# Patient Record
Sex: Male | Born: 1935 | Race: Black or African American | Hispanic: No | Smoking: Former smoker
Health system: Southern US, Community
[De-identification: ages and names within clinical notes are randomized; demographics above are authoritative.]

## PROBLEM LIST (undated history)

## (undated) DIAGNOSIS — I10 Essential (primary) hypertension: Secondary | ICD-10-CM

## (undated) DIAGNOSIS — R1310 Dysphagia, unspecified: Secondary | ICD-10-CM

## (undated) DIAGNOSIS — Q740 Other congenital malformations of upper limb(s), including shoulder girdle: Secondary | ICD-10-CM

## (undated) DIAGNOSIS — F0390 Unspecified dementia without behavioral disturbance: Secondary | ICD-10-CM

## (undated) DIAGNOSIS — C801 Malignant (primary) neoplasm, unspecified: Secondary | ICD-10-CM

## (undated) DIAGNOSIS — R489 Unspecified symbolic dysfunctions: Secondary | ICD-10-CM

## (undated) DIAGNOSIS — M6282 Rhabdomyolysis: Secondary | ICD-10-CM

## (undated) DIAGNOSIS — W19XXXA Unspecified fall, initial encounter: Secondary | ICD-10-CM

## (undated) DIAGNOSIS — N189 Chronic kidney disease, unspecified: Secondary | ICD-10-CM

---

## 1999-11-21 ENCOUNTER — Ambulatory Visit (HOSPITAL_COMMUNITY): Admission: RE | Admit: 1999-11-21 | Discharge: 1999-11-21 | Payer: Self-pay | Admitting: Family Medicine

## 1999-11-21 ENCOUNTER — Encounter: Payer: Self-pay | Admitting: Family Medicine

## 2000-01-10 ENCOUNTER — Other Ambulatory Visit: Admission: RE | Admit: 2000-01-10 | Discharge: 2000-01-10 | Payer: Self-pay | Admitting: Urology

## 2000-01-10 ENCOUNTER — Encounter (INDEPENDENT_AMBULATORY_CARE_PROVIDER_SITE_OTHER): Payer: Self-pay | Admitting: Specialist

## 2000-02-25 ENCOUNTER — Encounter: Admission: RE | Admit: 2000-02-25 | Discharge: 2000-05-25 | Payer: Self-pay | Admitting: Radiation Oncology

## 2000-06-12 ENCOUNTER — Encounter: Admission: RE | Admit: 2000-06-12 | Discharge: 2000-09-10 | Payer: Self-pay | Admitting: Radiation Oncology

## 2000-10-16 ENCOUNTER — Encounter: Admission: RE | Admit: 2000-10-16 | Discharge: 2001-01-14 | Payer: Self-pay | Admitting: Radiation Oncology

## 2001-04-26 ENCOUNTER — Encounter: Payer: Self-pay | Admitting: Emergency Medicine

## 2001-04-26 ENCOUNTER — Emergency Department (HOSPITAL_COMMUNITY): Admission: EM | Admit: 2001-04-26 | Discharge: 2001-04-26 | Payer: Self-pay

## 2003-10-24 ENCOUNTER — Encounter: Admission: RE | Admit: 2003-10-24 | Discharge: 2003-10-24 | Payer: Self-pay | Admitting: Family Medicine

## 2003-11-05 ENCOUNTER — Emergency Department (HOSPITAL_COMMUNITY): Admission: EM | Admit: 2003-11-05 | Discharge: 2003-11-05 | Payer: Self-pay | Admitting: Emergency Medicine

## 2003-11-23 ENCOUNTER — Encounter: Admission: RE | Admit: 2003-11-23 | Discharge: 2003-11-23 | Payer: Self-pay | Admitting: Family Medicine

## 2003-12-02 ENCOUNTER — Encounter: Admission: RE | Admit: 2003-12-02 | Discharge: 2003-12-02 | Payer: Self-pay | Admitting: Family Medicine

## 2004-01-03 ENCOUNTER — Encounter: Admission: RE | Admit: 2004-01-03 | Discharge: 2004-01-03 | Payer: Self-pay | Admitting: Sports Medicine

## 2004-05-02 ENCOUNTER — Ambulatory Visit: Payer: Self-pay | Admitting: Family Medicine

## 2004-08-13 ENCOUNTER — Ambulatory Visit: Payer: Self-pay | Admitting: Family Medicine

## 2004-11-28 ENCOUNTER — Ambulatory Visit: Payer: Self-pay | Admitting: Family Medicine

## 2004-12-12 ENCOUNTER — Ambulatory Visit: Payer: Self-pay | Admitting: Family Medicine

## 2006-08-11 ENCOUNTER — Ambulatory Visit: Payer: Self-pay | Admitting: Family Medicine

## 2006-08-11 ENCOUNTER — Encounter: Payer: Self-pay | Admitting: Family Medicine

## 2006-08-11 LAB — CONVERTED CEMR LAB
ALT: 13 units/L (ref 0–53)
AST: 19 units/L (ref 0–37)
Albumin: 4.7 g/dL (ref 3.5–5.2)
Alkaline Phosphatase: 68 units/L (ref 39–117)
BUN: 22 mg/dL (ref 6–23)
CO2: 29 meq/L (ref 19–32)
Calcium: 9.8 mg/dL (ref 8.4–10.5)
Chloride: 100 meq/L (ref 96–112)
Cholesterol: 202 mg/dL — ABNORMAL HIGH (ref 0–200)
Creatinine, Ser: 1.3 mg/dL (ref 0.40–1.50)
Glucose, Bld: 87 mg/dL (ref 70–99)
HDL: 49 mg/dL (ref >39)
LDL Cholesterol: 140 mg/dL — ABNORMAL HIGH (ref 0–99)
Potassium: 5.2 meq/L (ref 3.5–5.3)
Sodium: 137 meq/L (ref 135–145)
Total Bilirubin: 0.6 mg/dL (ref 0.3–1.2)
Total CHOL/HDL Ratio: 4.1
Total Protein: 7.6 g/dL (ref 6.0–8.3)
Triglycerides: 67 mg/dL (ref <150)
VLDL: 13 mg/dL (ref 0–40)

## 2006-08-22 ENCOUNTER — Ambulatory Visit (HOSPITAL_COMMUNITY): Admission: RE | Admit: 2006-08-22 | Discharge: 2006-08-22 | Payer: Self-pay | Admitting: Gastroenterology

## 2006-09-25 DIAGNOSIS — N182 Chronic kidney disease, stage 2 (mild): Secondary | ICD-10-CM | POA: Insufficient documentation

## 2006-09-25 DIAGNOSIS — IMO0002 Reserved for concepts with insufficient information to code with codable children: Secondary | ICD-10-CM | POA: Insufficient documentation

## 2006-09-25 DIAGNOSIS — I10 Essential (primary) hypertension: Secondary | ICD-10-CM

## 2006-09-25 DIAGNOSIS — E7800 Pure hypercholesterolemia, unspecified: Secondary | ICD-10-CM | POA: Insufficient documentation

## 2006-09-25 DIAGNOSIS — C61 Malignant neoplasm of prostate: Secondary | ICD-10-CM

## 2006-09-25 DIAGNOSIS — M1710 Unilateral primary osteoarthritis, unspecified knee: Secondary | ICD-10-CM

## 2006-10-30 ENCOUNTER — Telehealth (INDEPENDENT_AMBULATORY_CARE_PROVIDER_SITE_OTHER): Payer: Self-pay | Admitting: *Deleted

## 2006-12-15 ENCOUNTER — Telehealth (INDEPENDENT_AMBULATORY_CARE_PROVIDER_SITE_OTHER): Payer: Self-pay | Admitting: *Deleted

## 2006-12-17 ENCOUNTER — Ambulatory Visit: Payer: Self-pay | Admitting: Family Medicine

## 2007-09-25 ENCOUNTER — Encounter (INDEPENDENT_AMBULATORY_CARE_PROVIDER_SITE_OTHER): Payer: Self-pay | Admitting: *Deleted

## 2007-12-01 ENCOUNTER — Ambulatory Visit: Payer: Self-pay | Admitting: Family Medicine

## 2007-12-17 ENCOUNTER — Encounter: Admission: RE | Admit: 2007-12-17 | Discharge: 2007-12-17 | Payer: Self-pay | Admitting: Family Medicine

## 2008-01-01 ENCOUNTER — Telehealth: Payer: Self-pay | Admitting: *Deleted

## 2008-02-18 ENCOUNTER — Telehealth: Payer: Self-pay | Admitting: *Deleted

## 2008-02-24 ENCOUNTER — Ambulatory Visit: Payer: Self-pay | Admitting: Family Medicine

## 2008-03-24 ENCOUNTER — Ambulatory Visit: Payer: Self-pay | Admitting: Family Medicine

## 2008-04-22 ENCOUNTER — Ambulatory Visit: Payer: Self-pay | Admitting: Family Medicine

## 2008-05-20 ENCOUNTER — Telehealth: Payer: Self-pay | Admitting: *Deleted

## 2008-10-14 ENCOUNTER — Ambulatory Visit (HOSPITAL_COMMUNITY): Admission: RE | Admit: 2008-10-14 | Discharge: 2008-10-14 | Payer: Self-pay | Admitting: Family Medicine

## 2008-10-14 ENCOUNTER — Ambulatory Visit: Payer: Self-pay | Admitting: Family Medicine

## 2008-10-14 ENCOUNTER — Encounter (INDEPENDENT_AMBULATORY_CARE_PROVIDER_SITE_OTHER): Payer: Self-pay | Admitting: Family Medicine

## 2008-10-14 LAB — CONVERTED CEMR LAB
Chloride: 103 meq/L (ref 96–112)
Cholesterol: 160 mg/dL (ref 0–200)
Creatinine, Ser: 1.14 mg/dL (ref 0.40–1.50)
HDL: 63 mg/dL (ref >39)
LDL Cholesterol: 87 mg/dL (ref 0–99)
Potassium: 4.4 meq/L (ref 3.5–5.3)
Triglycerides: 49 mg/dL (ref <150)

## 2008-10-19 ENCOUNTER — Encounter (INDEPENDENT_AMBULATORY_CARE_PROVIDER_SITE_OTHER): Payer: Self-pay | Admitting: Family Medicine

## 2008-11-15 ENCOUNTER — Ambulatory Visit: Payer: Self-pay | Admitting: Family Medicine

## 2008-12-20 ENCOUNTER — Ambulatory Visit: Payer: Self-pay | Admitting: Family Medicine

## 2009-01-03 ENCOUNTER — Encounter (INDEPENDENT_AMBULATORY_CARE_PROVIDER_SITE_OTHER): Payer: Self-pay | Admitting: Family Medicine

## 2009-03-06 ENCOUNTER — Ambulatory Visit: Payer: Self-pay | Admitting: Family Medicine

## 2009-03-13 ENCOUNTER — Telehealth: Payer: Self-pay | Admitting: Family Medicine

## 2009-04-18 ENCOUNTER — Ambulatory Visit: Payer: Self-pay | Admitting: Family Medicine

## 2009-05-25 ENCOUNTER — Ambulatory Visit: Payer: Self-pay | Admitting: Family Medicine

## 2009-07-05 ENCOUNTER — Ambulatory Visit: Payer: Self-pay | Admitting: Family Medicine

## 2009-08-28 ENCOUNTER — Ambulatory Visit: Payer: Self-pay | Admitting: Family Medicine

## 2009-09-26 DEATH — deceased

## 2009-09-30 ENCOUNTER — Ambulatory Visit: Payer: Self-pay | Admitting: Family Medicine

## 2009-09-30 ENCOUNTER — Inpatient Hospital Stay (HOSPITAL_COMMUNITY): Admission: EM | Admit: 2009-09-30 | Discharge: 2009-10-02 | Payer: Self-pay | Admitting: Emergency Medicine

## 2009-10-11 ENCOUNTER — Ambulatory Visit: Payer: Self-pay | Admitting: Family Medicine

## 2009-10-11 ENCOUNTER — Encounter: Payer: Self-pay | Admitting: Sports Medicine

## 2009-10-11 DIAGNOSIS — F0390 Unspecified dementia without behavioral disturbance: Secondary | ICD-10-CM | POA: Insufficient documentation

## 2009-10-11 LAB — CONVERTED CEMR LAB
ALT: 47 units/L (ref 0–53)
AST: 31 units/L (ref 0–37)
Albumin: 4 g/dL (ref 3.5–5.2)
Alkaline Phosphatase: 80 units/L (ref 39–117)
BUN: 28 mg/dL — ABNORMAL HIGH (ref 6–23)
CO2: 24 meq/L (ref 19–32)
Calcium: 9.5 mg/dL (ref 8.4–10.5)
Chloride: 97 meq/L (ref 96–112)
Creatinine, Ser: 1.43 mg/dL (ref 0.40–1.50)
Glucose, Bld: 106 mg/dL — ABNORMAL HIGH (ref 70–99)
HCT: 42.1 % (ref 39.0–52.0)
Hemoglobin: 13.8 g/dL (ref 13.0–17.0)
MCHC: 32.8 g/dL (ref 30.0–36.0)
MCV: 103.2 fL — ABNORMAL HIGH (ref 78.0–100.0)
Platelets: 301 10*3/uL (ref 150–400)
Potassium: 4.8 meq/L (ref 3.5–5.3)
Prealbumin: 23.4 mg/dL (ref 18.0–45.0)
RBC: 4.08 M/uL — ABNORMAL LOW (ref 4.22–5.81)
RDW: 12.8 % (ref 11.5–15.5)
Sodium: 134 meq/L — ABNORMAL LOW (ref 135–145)
Total Bilirubin: 0.4 mg/dL (ref 0.3–1.2)
Total Protein: 6.7 g/dL (ref 6.0–8.3)
WBC: 9.2 10*3/microliter (ref 4.0–10.5)

## 2009-10-26 ENCOUNTER — Encounter: Payer: Self-pay | Admitting: *Deleted

## 2009-11-27 ENCOUNTER — Ambulatory Visit: Payer: Self-pay | Admitting: Family Medicine

## 2009-12-07 ENCOUNTER — Ambulatory Visit: Payer: Self-pay | Admitting: Family Medicine

## 2009-12-07 ENCOUNTER — Encounter: Payer: Self-pay | Admitting: *Deleted

## 2009-12-21 ENCOUNTER — Encounter: Payer: Self-pay | Admitting: Family Medicine

## 2010-01-04 ENCOUNTER — Ambulatory Visit: Payer: Self-pay | Admitting: Family Medicine

## 2010-01-09 ENCOUNTER — Ambulatory Visit: Payer: Self-pay | Admitting: Family Medicine

## 2010-01-12 ENCOUNTER — Encounter: Payer: Self-pay | Admitting: Family Medicine

## 2010-01-12 ENCOUNTER — Ambulatory Visit: Payer: Self-pay | Admitting: Family Medicine

## 2010-01-12 ENCOUNTER — Encounter: Payer: Self-pay | Admitting: Sports Medicine

## 2010-01-18 ENCOUNTER — Encounter (INDEPENDENT_AMBULATORY_CARE_PROVIDER_SITE_OTHER): Payer: Self-pay | Admitting: *Deleted

## 2010-01-18 LAB — CONVERTED CEMR LAB
BUN: 14 mg/dL (ref 6–23)
CO2: 22 meq/L (ref 19–32)
Chloride: 103 meq/L (ref 96–112)
Creatinine, Ser: 1.03 mg/dL (ref 0.40–1.50)
Glucose, Bld: 97 mg/dL (ref 70–99)
Potassium: 3.6 meq/L (ref 3.5–5.3)

## 2010-01-23 ENCOUNTER — Encounter: Payer: Self-pay | Admitting: *Deleted

## 2010-04-10 ENCOUNTER — Ambulatory Visit: Payer: Self-pay | Admitting: Family Medicine

## 2010-06-04 ENCOUNTER — Encounter: Payer: Self-pay | Admitting: Sports Medicine

## 2010-07-18 ENCOUNTER — Ambulatory Visit: Payer: Self-pay | Admitting: Family Medicine

## 2010-07-18 ENCOUNTER — Encounter: Payer: Self-pay | Admitting: Sports Medicine

## 2010-08-03 ENCOUNTER — Ambulatory Visit: Admission: RE | Admit: 2010-08-03 | Discharge: 2010-08-03 | Payer: Self-pay | Source: Home / Self Care

## 2010-08-30 NOTE — Miscellaneous (Signed)
Summary: ophthalmology appt/TS  Clinical Lists Changes APPT WITH DR. [facility]  JUNE 2ND AT [facility]  [address]  [facility] # [phone]

## 2010-08-30 NOTE — Assessment & Plan Note (Signed)
Summary: F/U EO   Vital Signs:  Patient profile:   77 year old male Weight:      157.9 pounds Temp:     97.5 degrees F oral Pulse rate:   98 / minute Pulse rhythm:   regular BP sitting:   188 / 82  (left arm) Cuff size:   large  Vitals Entered By: Loralee Pacas CMA (August 28, 2009 3:42 PM)  Serial Vital Signs/Assessments:  Time      Position  BP       Pulse  Resp  Temp     By                     136/81                         Loralee Pacas CMA   Primary Care Provider:  Magnus Ivan MD  CC:  f/u knee pain.  History of Present Illness: knee pain:  patient still not bought tramadol.  still having difficulty explaining to him the need to buy medicine.  patient does not have pain moving around the house.  Allergies: No Known Drug Allergies  Physical Exam  General:  NAD vital signs noted and wnl and retake was 136/81. Msk:  5/5 lower body strength Extremities:  no lower extremity edema   Knee Exam  Inspection:    R knee swelling under the knee cap. L knee wnl  Palpation:    no effusions   Impression & Recommendations:  Problem # 1:  KNEE PAIN, RIGHT (ICD-719.46) Assessment Deteriorated  Pt still not with tramadol.  Ms. Kennon Rounds helped me to help pt organize what to take to pharmacist (med list with tramadol and insurance card).   His updated medication list for this problem includes:    Adult Aspirin Ec Low Strength 81 Mg Tbec (Aspirin) .Marland Kitchen... Daily    Tramadol Hcl 50 Mg Tabs (Tramadol hcl) .Marland Kitchen... Take 2 tablets three times a day  Orders: [facility]- Est Level  3 (16109)  Complete Medication List: 1)  Enalapril Maleate 20 Mg Tabs (Enalapril maleate) .... Take 2 tablet by mouth once a day 2)  Hydrochlorothiazide 25 Mg Tabs (Hydrochlorothiazide) .... Take one tab daily 3)  Zocor 10 Mg Tabs (Simvastatin) .... Take 1 tablet by mouth once a day 4)  Amlodipine Besylate 10 Mg Tabs (Amlodipine besylate) .Marland Kitchen.. 1 tab by mouth daily 5)  Adult Aspirin Ec Low Strength 81 Mg  Tbec (Aspirin) .... Daily 6)  Tramadol Hcl 50 Mg Tabs (Tramadol hcl) .... Take 2 tablets three times a day  Patient Instructions: 1)  Mr. Lozinski, I am giving you  sheet to take to your pharmacist.  Please take the sheet and your insurance card to your pharmacist so that you can start taking TRAMADOL for your knee pain.  Please follow up in a month to see if your pain is getting better. 2)  Thank you and be blessed! Prescriptions: TRAMADOL HCL 50 MG TABS (TRAMADOL HCL) take 2 tablets three times a day  #180 x 3   Entered and Authorized by:   Magnus Ivan MD   Signed by:   Magnus Ivan MD on 08/28/2009   Method used:   Electronically to        [facility] (retail)       [address]       [place], [place]  [address]       Ph:  [phone]       Fax: [phone]   RxID:   [id number]

## 2010-08-30 NOTE — Assessment & Plan Note (Signed)
Summary: Geri Evaluation   Vital Signs:  Patient profile:   77 year old male Weight:      144 pounds Pulse rate:   108 / minute BP sitting:   118 / 70  (left arm) Cuff size:   regular  Vitals Entered By: Renato Battles slade,cma CC: R knee pain Is Patient Diabetic? No Pain Assessment Patient in pain? yes        Primary Care Provider:  Magnus Ivan MD  CC:  R knee pain.  History of Present Illness: 77yo M here for geri assessment  Geri assessment: Last geri assessment was 04/2009 w/ a MMSE of 23/30.  Repeat MMSE per Dr. Karie Schwalbe conveyed score of 19/30 w/ concerns regarding his living situation.  Pt states that his major concern and complaint is right knee pain.  He states that he has chronic arthritis and thinks that is causing his pain.  Worsened over the past few weeks.  No recent injury, fever, worsening redness, or swelling.  Currently on tramadol/acetaminophen with relief occasionally.    He states that he lives in an apt alone and is able to cook, clean, and doing his shopping/finances.  Denies any recent falls.  Daughter is in the area and will check on him from time to time.    Has not picked up the Aricept prescribed on 5/2.      Habits & Providers  Alcohol-Tobacco-Diet     Tobacco Status: quit > 6 months  Current Medications (verified): 1)  Enalapril Maleate 20 Mg Tabs (Enalapril Maleate) .... Take 2 Tablet By Mouth Once A Day 2)  Hydrochlorothiazide 25 Mg  Tabs (Hydrochlorothiazide) .... Take One Tab Daily 3)  Zocor 10 Mg Tabs (Simvastatin) .... Take 1 Tablet By Mouth Once A Day 4)  Adult Aspirin Ec Low Strength 81 Mg Tbec (Aspirin) .... Daily 5)  Tramadol Hcl 50 Mg Tabs (Tramadol Hcl) .Marland Kitchen.. 1 Tab By Mouth Every 6 Hours As Needed For Breakthrough Pain 6)  Ensure/fiber  Liqd (Nutritional Supplements) .... One Shake Three Times A Day Between Meals 7)  Cyanocobalamin 1000 Mcg/ml Soln (Cyanocobalamin) .... Inject Im Q Monthly For 6 Months, First Dose Beginning of April  2011 8)  Aricept 5 Mg Tabs (Donepezil Hcl) .... One Tab By Mouth Qhs 9)  Tylenol Extra Strength 500 Mg Tabs (Acetaminophen) .... 2 Tabs By Mouth Every 8 Hours Scheduled For Arthritis  Allergies (verified): No Known Drug Allergies  Past History:  Past Medical History: Last updated: 11/27/2009 KNEE PAIN, RIGHT (ICD-719.46) RENAL INSUFFICIENCY, CHRONIC (ICD-586) PROSTATE CANCER (ICD-185) OSTEOARTHRITIS, MULTI SITES (ICD-715.98) HYPERTENSION, BENIGN SYSTEMIC (ICD-401.1) HYPERCHOLESTEROLEMIA (ICD-272.0) h/o stroke (03/06/2009)  Concern for literacy/educational status from 1997 records (updated 04/24/2009) MMSE 19 (See last geri assessment) Megaloblastic/B12 deficiency      Social History: Last updated: 11/27/2009 lives in apt in [place]. Divorced, works on occasion in [employer].  No tobacco, occas etoh (1 drink every other day to everyday).  Three sisters, had son die at 65 yo w/ ht problems, dtr lives in [place]. pt has not worked since 2007 (03/06/2009) Daughter :Sharon Seller, pt does not know contact info In the process of trying to get PCA for pt.  Social History: Smoking Status:  quit > 6 months  Physical Exam  General:  VS Reviewed. Elderly thin male, walks with a four prong walker favoring right leg  Head:  atraumatic Eyes:  cataract present EOMI PERRLA vision grossly intact Ears:  cerumen impacted in both ears Mouth:  edentulous Neck:  supple,  full ROM Lungs:  Normal respiratory effort, chest expands symmetrically. Lungs are clear to auscultation, no crackles or wheezes. Heart:  Normal rate and regular rhythm. S1 and S2 normal without gallop, murmur, click, rub or other extra sounds. Abdomen:  Soft, NT, ND, no HSM, active BS  Msk:  R knee exam: Inspection- no obvious bony deformity; no erythema, edema, or ecchymosis ROM- full extension and flexion Palpation- no joint line tenderness; no hypermobile patella; mild crepitus  ligaments intact with good  endpoints atalgic gait present favoring right leg Extremities:  no edema Neurologic:  A&O to person, place, and time (able to recall today's date) 3 word recall- nl Able to make association of his medications to his medical condition   Impression & Recommendations:  Problem # 1:  OSTEOARTHRITIS, MULTI SITES (ICD-715.98) Assessment Deteriorated R knee pain likely due to progressive OA.  Previous xray on 12/17/07 conveyed advanced DJD of the knee joint. It seems to be affecting his function more so than before.    Plan to refer to [facility] for evaluation for possible knee replacement. Will change pain medication to scheduled tylenol 1000mg  by mouth q8.  Will change tramadol to q6 as needed for breakthrough pain. Will order home PT.  His updated medication list for this problem includes:    Adult Aspirin Ec Low Strength 81 Mg Tbec (Aspirin) .Marland Kitchen... Daily    Tramadol Hcl 50 Mg Tabs (Tramadol hcl) .Marland Kitchen... 1 tab by mouth every 6 hours as needed for breakthrough pain    Tylenol Extra Strength 500 Mg Tabs (Acetaminophen) .Marland Kitchen... 2 tabs by mouth every 8 hours scheduled for arthritis  Orders: Home Health Referral [facility]) Orthopedic Referral (Ortho) [facility]- Est  Level 4 (16109)  Problem # 2:  DEMENTIA (ICD-294.8) Assessment: Unchanged  I suspect that his dementia is not as severe as the previous MMSE indicates.  He was able to answer questions appropriately and able to recall information.  Plan to continue with the Aricept prescribed.  Will further reassess the MMSE more thoroughly next month at [facility].  Orders: [facility]- Est  Level 4 ([address]454)  Problem # 3:  CERUMEN IMPACTION, BILATERAL (ICD-380.4) Assessment: Deteriorated  Nursing staff to perform irrigation.  Orders: [facility]- Est  Level 4 (09811)  Problem # 4:  MALNUTRITION (ICD-263.9) Assessment: Deteriorated  documented wt loss over the past few months. will f/u with pcp to observe and follow last albumin 4  (09/2009)  Orders: [facility]- Est  Level 4 (91478)  Problem # 5:  Preventive Health Care (ICD-V70.0) Assessment: Comment Only Pt w/ some degree of health illiteracy...will refer social work to his home to help him manage his finances and obtain resources to manage his health and medicines. Nursing staff to help him obtain eye appt. No signs of geriatric depression.  Complete Medication List: 1)  Enalapril Maleate 20 Mg Tabs (Enalapril maleate) .... Take 2 tablet by mouth once a day 2)  Hydrochlorothiazide 25 Mg Tabs (Hydrochlorothiazide) .... Take one tab daily 3)  Zocor 10 Mg Tabs (Simvastatin) .... Take 1 tablet by mouth once a day 4)  Adult Aspirin Ec Low Strength 81 Mg Tbec (Aspirin) .... Daily 5)  Tramadol Hcl 50 Mg Tabs (Tramadol hcl) .Marland Kitchen.. 1 tab by mouth every 6 hours as needed for breakthrough pain 6)  Ensure/fiber Liqd (Nutritional supplements) .... One shake three times a day between meals 7)  Cyanocobalamin 1000 Mcg/ml Soln (Cyanocobalamin) .... Inject im q monthly for 6 months, first dose beginning of april 2011 8)  Aricept 5 Mg Tabs (Donepezil hcl) .... One tab by mouth qhs 9)  Tylenol Extra Strength 500 Mg Tabs (Acetaminophen) .... 2 tabs by mouth every 8 hours scheduled for arthritis  Patient Instructions: 1)  Follow up in geri clinic in 1 month 2)  We are referring you to an orthopedist for further evaluation of your right knee.   3)  We are going to send a physical therapist and social worker to your home to help you with your walking and finances. 4)  We added Tylenol to your pain regimen. 5)  We changed your Tramadol to 1 tab by mouth every 6 hours as needed for breakthrough pain. 6)  Go pick up your Aricept.   Geriatric Assessment:  Geriatric Depression Score: (value/max value)    Short-15: 13/15

## 2010-08-30 NOTE — Assessment & Plan Note (Signed)
Summary: feet and lower leg swelling  Nurse Visit patient comes in to read PPD . son is with him today. feet and lower legs tight and swollen. son reports he did a lot of walking yesterday.  appointment scheduled for work in now to be evaluated .  Theresia Lo RN  January 12, 2010 10:23 AM  Noted, SDA today. Rodney Langton MD  January 15, 2010 3:14 PM    Allergies: No Known Drug Allergies  PPD Results    Date of reading: 01/12/2010    Results: 0 mm    Interpretation: negative

## 2010-08-30 NOTE — Assessment & Plan Note (Signed)
Summary: f/u  kh   Vital Signs:  Patient profile:   77 year old male Height:      72 inches Weight:      152.5 pounds BMI:     20.76 Temp:     98.3 degrees F oral Pulse rate:   82 / minute BP sitting:   167 / 88  (right arm) Cuff size:   regular  Vitals Entered By: Jimmy Footman, CMA (August 03, 2010 10:49 AM) CC: follow up knee injection Is Patient Diabetic? No   Primary Care Provider:  Rodney Langton MD  CC:  follow up knee injection.  History of Present Illness: 77 yo male with HTN and knee pain.  R knee pain:  Injected 07/18/10, pain significantly better, function much better.    HTN:  BP usually runs 120's-140's SBP.  He is high today.  Current Medications (verified): 1)  Enalapril Maleate 20 Mg Tabs (Enalapril Maleate) .... Take 2 Tablet By Mouth Once A Day 2)  Hydrochlorothiazide 25 Mg  Tabs (Hydrochlorothiazide) .... Take One Tab Daily 3)  Zocor 10 Mg Tabs (Simvastatin) .... Take 1 Tablet By Mouth Once A Day 4)  Adult Aspirin Ec Low Strength 81 Mg Tbec (Aspirin) .... Daily 5)  Tramadol Hcl 50 Mg Tabs (Tramadol Hcl) .Marland Kitchen.. 1 Tab By Mouth Every 6 Hours As Needed For Breakthrough Pain 6)  Ensure/fiber  Liqd (Nutritional Supplements) .... One Shake Three Times A Day Between Meals 7)  Cyanocobalamin 1000 Mcg/ml Soln (Cyanocobalamin) .... Inject Im Q Monthly For 6 Months, First Dose Beginning of April 2011 8)  Aricept 5 Mg Tabs (Donepezil Hcl) .... One Tab By Mouth Qhs 9)  Tylenol Extra Strength 500 Mg Tabs (Acetaminophen) .... 2 Tabs By Mouth Every 8 Hours Scheduled For Arthritis 10)  Compression Stockings .... Wear As Much As Tolerated Disp: Medium Grade, Knee High, 1 Pair  Allergies (verified): No Known Drug Allergies  Review of Systems       See HPI  Physical Exam  General:  Well-developed,well-nourished,in no acute distress; alert,appropriate and cooperative throughout examination Msk:  R Knee: Normal to inspection with no erythema or effusion or obvious  bony abnormalities. Palpation normal with no warmth or joint line tenderness or patellar tenderness or condyle tenderness. ROM normal in flexion and extension and lower leg rotation. Ligaments with solid consistent endpoints including ACL, PCL, LCL, MCL. Negative Mcmurray's and provocative meniscal tests. Non painful patellar compression. Patellar and quadriceps tendons unremarkable. Hamstring and quadriceps strength is normal.     Impression & Recommendations:  Problem # 1:  OSTEOARTHRITIS, MULTI SITES (ICD-715.98) Assessment Improved Knee symptoms much improved. I can inject his knee q87months as long as he still has long term benefits. RTC as needed.  His updated medication list for this problem includes:    Adult Aspirin Ec Low Strength 81 Mg Tbec (Aspirin) .Marland Kitchen... Daily    Tramadol Hcl 50 Mg Tabs (Tramadol hcl) .Marland Kitchen... 1 tab by mouth every 6 hours as needed for breakthrough pain    Tylenol Extra Strength 500 Mg Tabs (Acetaminophen) .Marland Kitchen... 2 tabs by mouth every 8 hours scheduled for arthritis  Orders: [facility]- Est Level  3 (16109)  Problem # 2:  HYPERTENSION, BENIGN SYSTEMIC (ICD-401.1) Assessment: Deteriorated Elevated today. Pt to RTC 3 months to recheck. Do not want tight control in this unsteady 77 year old as to avoid orthostasis and a fall.  His updated medication list for this problem includes:    Enalapril Maleate 20 Mg Tabs (  Enalapril maleate) .Marland Kitchen... Take 2 tablet by mouth once a day    Hydrochlorothiazide 25 Mg Tabs (Hydrochlorothiazide) .Marland Kitchen... Take one tab daily  Complete Medication List: 1)  Enalapril Maleate 20 Mg Tabs (Enalapril maleate) .... Take 2 tablet by mouth once a day 2)  Hydrochlorothiazide 25 Mg Tabs (Hydrochlorothiazide) .... Take one tab daily 3)  Zocor 10 Mg Tabs (Simvastatin) .... Take 1 tablet by mouth once a day 4)  Adult Aspirin Ec Low Strength 81 Mg Tbec (Aspirin) .... Daily 5)  Tramadol Hcl 50 Mg Tabs (Tramadol hcl) .Marland Kitchen.. 1 tab by mouth every 6 hours  as needed for breakthrough pain 6)  Ensure/fiber Liqd (Nutritional supplements) .... One shake three times a day between meals 7)  Cyanocobalamin 1000 Mcg/ml Soln (Cyanocobalamin) .... Inject im q monthly for 6 months, first dose beginning of april 2011 8)  Aricept 5 Mg Tabs (Donepezil hcl) .... One tab by mouth qhs 9)  Tylenol Extra Strength 500 Mg Tabs (Acetaminophen) .... 2 tabs by mouth every 8 hours scheduled for arthritis 10)  Compression Stockings  .... Wear as much as tolerated disp: medium grade, knee high, 1 pair   Orders Added: 1)  [facility]- Est Level  3 [16109]

## 2010-08-30 NOTE — Assessment & Plan Note (Signed)
Summary: tb test/eo  Nurse Visit   Allergies: No Known Drug Allergies  Immunizations Administered:  PPD Skin Test:    Vaccine Type: PPD    Site: right forearm    Mfr: Sanofi Pasteur    Dose: 0.1 ml    Route: ID    Given by: Theresia Lo RN    Exp. Date: 05/11/2011    Lot #: [id number]  Orders Added: 1)  TB Skin Test [86580] 2)  Admin 1st Vaccine [phone]

## 2010-08-30 NOTE — Consult Note (Signed)
Summary: SM&OC  SM&OC   Imported By: Clydell Hakim 12/26/2009 16:00:28  _____________________________________________________________________  External Attachment:    Type:   Image     Comment:   External Document  Appended Document: SM&OC Patient seen in [facility].  Note reviewed.

## 2010-08-30 NOTE — Assessment & Plan Note (Signed)
Summary: follow up per  Dr. Phyllis Ginger   Vital Signs:  Patient profile:   77 year old male Height:      72 inches Weight:      143.0 pounds BMI:     19.46 Temp:     98.2 degrees F oral Pulse rate:   109 / minute BP sitting:   111 / 65  (left arm) Cuff size:   regular  Vitals Entered By: Gladstone Pih (Nov 27, 2009 1:46 PM) CC: F/U Is Patient Diabetic? No Pain Assessment Patient in pain? no        Primary Care Provider:  Magnus Ivan MD  CC:  F/U.  History of Present Illness: Pt with terrible home situation with just a friend hat occasionally comes by to help but o/w lives alone.    He is malnourished and partially dependent with IADLs.  MMSE 19.   -Missed Geri appt. -Did not get ensure supplement -Has only a daughter but no contact number for her -[facility] could not see pt as not covered by medicare w/ dementia as dx. -Pt states gets paid enough for food but only eats 2 meals a day.  B12 deficiency:  Megaloblastic. Not anemic yet.  Missed appt so did not get first B12 shot.    Habits & Providers  Alcohol-Tobacco-Diet     Tobacco Status: never  Allergies: No Known Drug Allergies  Past History:  Past Medical History: KNEE PAIN, RIGHT (ICD-719.46) RENAL INSUFFICIENCY, CHRONIC (ICD-586) PROSTATE CANCER (ICD-185) OSTEOARTHRITIS, MULTI SITES (ICD-715.98) HYPERTENSION, BENIGN SYSTEMIC (ICD-401.1) HYPERCHOLESTEROLEMIA (ICD-272.0) h/o stroke (03/06/2009)  Concern for literacy/educational status from 1997 records (updated 04/24/2009) MMSE 19 (See last geri assessment) Megaloblastic/B12 deficiency      Social History: lives in apt in [place]. Divorced, works on occasion in [employer].  No tobacco, occas etoh (1 drink every other day to everyday).  Three sisters, had son die at 40 yo w/ ht problems, dtr lives in [place]. pt has not worked since 2007 (03/06/2009) Daughter :Sharon Seller, pt does not know contact info In the process of trying to get PCA for  pt.  Review of Systems       See HPI  Physical Exam  General:  Well-developed,well-nourished,in no acute distress; alert,appropriate and cooperative throughout examination Lungs:  Normal respiratory effort, chest expands symmetrically. Lungs are clear to auscultation, no crackles or wheezes. Heart:  Normal rate and regular rhythm. S1 and S2 normal without gallop, murmur, click, rub or other extra sounds. Abdomen:  Bowel sounds positive,abdomen soft and non-tender without masses, organomegaly or hernias noted. Msk:  No deformity or scoliosis noted of thoracic or lumbar spine.   Extremities:  No clubbing, cyanosis, edema, or deformity noted with normal full range of motion of all joints.     Impression & Recommendations:  Problem # 1:  DEMENTIA (ICD-294.8) Assessment Unchanged MMSE of 19.  Partially dependent with IADLs.  Will re-refer to [facility].  [facility]-refer to [facility] for PCA (added FTT to dx).  Starting Aricept 5mg  qHS.  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  Did not get Ensure, did not fu with geri clinic.  Concern for appetite.  Re-educated pt re: nutritional supplements.  Will start mirtazepine if still w/o wt gain at next visit.  Orders: [facility]- Est  Level 4 (99214) Home Health Referral (Home Health)  Problem # 3:  HYPERTENSION, BENIGN SYSTEMIC (ICD-401.1) Assessment: Unchanged BP ok, removed amlodipine. RTC 1 month to reassess.  The following medications were removed from the medication list:    Amlodipine Besylate 10 Mg Tabs (Amlodipine besylate) .Marland Kitchen... 1 tab by mouth daily His updated medication list for this problem includes:    Enalapril Maleate 20 Mg Tabs (Enalapril maleate) .Marland Kitchen... Take 2 tablet by mouth once a day    Hydrochlorothiazide 25 Mg Tabs (Hydrochlorothiazide) .Marland Kitchen... Take one tab daily  Orders: [facility]- Est  Level 4 (95621)  Problem #  4:  VITAMIN B12 DEFICIENCY (ICD-266.2) Assessment: Unchanged B12 shot given today, needs to be q month x 5 more months.  Orders: [facility]- Est  Level 4 (99214) Vit B12 1000 mcg (J3420) Admin of Therapeutic Inj  intramuscular or subcutaneous (30865)  Problem # 5:  DISPO Assessment: Comment Only Pt voices understanding to all above orders.  Complete Medication List: 1)  Enalapril Maleate 20 Mg Tabs (Enalapril maleate) .... Take 2 tablet by mouth once a day 2)  Hydrochlorothiazide 25 Mg Tabs (Hydrochlorothiazide) .... Take one tab daily 3)  Zocor 10 Mg Tabs (Simvastatin) .... Take 1 tablet by mouth once a day 4)  Adult Aspirin Ec Low Strength 81 Mg Tbec (Aspirin) .... Daily 5)  Tramadol Hcl 50 Mg Tabs (Tramadol hcl) .... Take 2 tablets three times a day 6)  Ensure/fiber Liqd (Nutritional supplements) .... One shake three times a day between meals 7)  Cyanocobalamin 1000 Mcg/ml Soln (Cyanocobalamin) .... Inject im q monthly for 6 months, first dose beginning of april 2011 8)  Aricept 5 Mg Tabs (Donepezil hcl) .... One tab by mouth qhs  Patient Instructions: 1)  Make appt with geriatrics clinic at front (DO NOT MISS APPT) 2)  STOP your amlodipine 3)  Start a new medicine: aricept 4)  Will try again to have Home health assistant come to your house 5)  B12 shot today 6)  Come back to see me in one month. 7)  -Dr. Karie Schwalbe. Prescriptions: ARICEPT 5 MG TABS (DONEPEZIL HCL) One tab by mouth qHS  #30 x 6   Entered and Authorized by:   Rodney Langton MD   Signed by:   Rodney Langton MD on 11/27/2009   Method used:   Electronically to        [facility] (retail)       [address]       [place], [place]  [address]       Ph: [phone]       Fax: [phone]   RxID:   [id number]    Medication Administration  Injection # 1:    Medication: Vit B12 1000 mcg    Diagnosis: VITAMIN B12 DEFICIENCY (ICD-266.2)    Route: IM    Site: R deltoid    Exp Date:  12/12    Lot #: [id number]    Mfr: [employer]    Patient tolerated injection without complications    Given by: Gladstone Pih (Nov 27, 2009 2:15 PM)  Orders Added: 1)  [facility]- Est  Level 4 [99214] 2)  Home Health Referral [Home Health] 3)  Vit B12 1000 mcg [J3420] 4)  Admin of Therapeutic Inj  intramuscular or subcutaneous [33295]

## 2010-08-30 NOTE — Assessment & Plan Note (Signed)
Summary: new LE edema   Vital Signs:  Patient profile:   77 year old male Weight:      149.8 pounds Temp:     97.9 degrees F oral BP sitting:   145 / 70  (left arm) Cuff size:   regular  Vitals Entered By: Jimmy Footman, CMA (January 12, 2010 11:03 AM) CC: Swelling of both feet from ankle down X1 week Is Patient Diabetic? No Pain Assessment Patient in pain? yes     Location: both legs Intensity: 5   Primary Care Provider:  Magnus Ivan MD  CC:  Swelling of both feet from ankle down X1 week.  History of Present Illness: 77yo M w/ new LE swelling  LE edema: New x several days.  No pain, redness, or bruising.  bilateral.  No hx of trauma.  No fevers, chills, SOB, or chest pain.  No hx of DVT.  States he has compression stockings at home but cannot put them on by himself.    Habits & Providers  Alcohol-Tobacco-Diet     Tobacco Status: quit     Tobacco Counseling: to remain off tobacco products  Current Medications (verified): 1)  Enalapril Maleate 20 Mg Tabs (Enalapril Maleate) .... Take 2 Tablet By Mouth Once A Day 2)  Hydrochlorothiazide 25 Mg  Tabs (Hydrochlorothiazide) .... Take One Tab Daily 3)  Zocor 10 Mg Tabs (Simvastatin) .... Take 1 Tablet By Mouth Once A Day 4)  Adult Aspirin Ec Low Strength 81 Mg Tbec (Aspirin) .... Daily 5)  Tramadol Hcl 50 Mg Tabs (Tramadol Hcl) .Marland Kitchen.. 1 Tab By Mouth Every 6 Hours As Needed For Breakthrough Pain 6)  Ensure/fiber  Liqd (Nutritional Supplements) .... One Shake Three Times A Day Between Meals 7)  Cyanocobalamin 1000 Mcg/ml Soln (Cyanocobalamin) .... Inject Im Q Monthly For 6 Months, First Dose Beginning of April 2011 8)  Aricept 5 Mg Tabs (Donepezil Hcl) .... One Tab By Mouth Qhs 9)  Tylenol Extra Strength 500 Mg Tabs (Acetaminophen) .... 2 Tabs By Mouth Every 8 Hours Scheduled For Arthritis 10)  Compression Stockings .... Wear As Much As Tolerated Disp: Medium Grade, Knee High, 1 Pair  Allergies (verified): No Known Drug  Allergies  Social History: Smoking Status:  quit  Review of Systems      See HPI  Physical Exam  General:  VS Reviewed. Elderly thin male, walks with a four prong walker favoring right leg  Neck:  no JVD Lungs:  Normal respiratory effort, chest expands symmetrically. Lungs are clear to auscultation, no crackles or wheezes. Heart:  Normal rate and regular rhythm. S1 and S2 normal without gallop, murmur, click, rub or other extra sounds. Msk:  kyphotic  Pulses:  difficult to assess dp pulses with degree of edema Extremities:  2+ edema b/l no ecchymosis or ttp   Impression & Recommendations:  Problem # 1:  LEG EDEMA (ICD-782.3) Assessment New Likely venous pooling/stasis- will treat with conservative care. Compression stockings and elevation...dec salt intake. Will reassess renal function and check BNP to r/o CHF. No respiratory distress. No signs or symptoms of DVT. Will f/u in 1-2 weeks to reassess.  His updated medication list for this problem includes:    Hydrochlorothiazide 25 Mg Tabs (Hydrochlorothiazide) .Marland Kitchen... Take one tab daily  Orders: B Nat Peptide[facility] [phone]) Basic Met[facility] [phone]) [facility]- Est Level  3 (95284)  Complete Medication List: 1)  Enalapril Maleate 20 Mg Tabs (Enalapril maleate) .... Take 2 tablet by mouth once a day 2)  Hydrochlorothiazide 25  Mg Tabs (Hydrochlorothiazide) .... Take one tab daily 3)  Zocor 10 Mg Tabs (Simvastatin) .... Take 1 tablet by mouth once a day 4)  Adult Aspirin Ec Low Strength 81 Mg Tbec (Aspirin) .... Daily 5)  Tramadol Hcl 50 Mg Tabs (Tramadol hcl) .Marland Kitchen.. 1 tab by mouth every 6 hours as needed for breakthrough pain 6)  Ensure/fiber Liqd (Nutritional supplements) .... One shake three times a day between meals 7)  Cyanocobalamin 1000 Mcg/ml Soln (Cyanocobalamin) .... Inject im q monthly for 6 months, first dose beginning of april 2011 8)  Aricept 5 Mg Tabs (Donepezil hcl) .... One tab by mouth qhs 9)  Tylenol Extra  Strength 500 Mg Tabs (Acetaminophen) .... 2 tabs by mouth every 8 hours scheduled for arthritis 10)  Compression Stockings  .... Wear as much as tolerated disp: medium grade, knee high, 1 pair  Patient Instructions: 1)  Schedule f/u in 1-2 weeks to reassess leg swelling. 2)  Wear the compression stockings as much as tolerated.   3)  Keep the legs elevated when sitting or lying (important to raise above heart level) 4)  Decrease the salt intake. 5)  If you develop leg pain or trouble breathing, call us immediately. Prescriptions: COMPRESSION STOCKINGS wear as much as tolerated Disp: medium grade, knee high, 1 pair  #1 x 1   Entered and Authorized by:   Marisue Ivan  MD   Signed by:   Marisue Ivan  MD on 01/12/2010   Method used:   Print then Give to Patient   RxID:   [id number]

## 2010-08-30 NOTE — Letter (Signed)
Summary: Generic Letter  [facility]  [address]   [place], [place] [address]   Phone: [phone]  Fax: [phone]    10/26/2009  [name] [name] [address] [place], [place]  [address]  Dear Mr. MURAD,     Dr. Sandria Manly has requested that you have personal care assistant for Activities of Daily Living.  The only insurance we have listed for you is medicare. Medicare will not pay for this service. There are several companies you can contact to ask about cost and to see if this is something you will be able to pay for. [employer] [phone] and [employer] [phone]. Please call either of  these numbers  to ask  about their services.        Sincerely,   Theresia Lo RN

## 2010-08-30 NOTE — Assessment & Plan Note (Signed)
Summary: F/U/KH   Vital Signs:  Patient profile:   77 year old male Height:      72 inches Weight:      149 pounds BMI:     20.28 Temp:     97.6 degrees F Pulse rate:   74 / minute BP sitting:   136 / 83  (left arm) Cuff size:   regular  Vitals Entered By: Garen Grams LPN (April 10, 2010 11:54 AM) CC: right leg pain Is Patient Diabetic? No Pain Assessment Patient in pain? yes     Location: right leg   Primary Care Provider:  Rodney Langton MD  CC:  right leg pain.  History of Present Illness: 77 yo male with hip pain.  Present decades, went to [facility], XR showed severe DJD R hip.  Worst they've seen.  He is a poor operative risk and they do not want to do a THA.  TB osteo suspected, PPD was neg.  He comes in with c/o the same pain.  He is using tramadol 1-2x a day and this does help his pain significantly.  Pain is sharp and does radiate down his thigh occasionally, lateral aspect.  Due for pneumovax, TDap  Habits & Providers  Alcohol-Tobacco-Diet     Tobacco Status: quit     Tobacco Counseling: to remain off tobacco products  Current Medications (verified): 1)  Enalapril Maleate 20 Mg Tabs (Enalapril Maleate) .... Take 2 Tablet By Mouth Once A Day 2)  Hydrochlorothiazide 25 Mg  Tabs (Hydrochlorothiazide) .... Take One Tab Daily 3)  Zocor 10 Mg Tabs (Simvastatin) .... Take 1 Tablet By Mouth Once A Day 4)  Adult Aspirin Ec Low Strength 81 Mg Tbec (Aspirin) .... Daily 5)  Tramadol Hcl 50 Mg Tabs (Tramadol Hcl) .Marland Kitchen.. 1 Tab By Mouth Every 6 Hours As Needed For Breakthrough Pain 6)  Ensure/fiber  Liqd (Nutritional Supplements) .... One Shake Three Times A Day Between Meals 7)  Cyanocobalamin 1000 Mcg/ml Soln (Cyanocobalamin) .... Inject Im Q Monthly For 6 Months, First Dose Beginning of April 2011 8)  Aricept 5 Mg Tabs (Donepezil Hcl) .... One Tab By Mouth Qhs 9)  Tylenol Extra Strength 500 Mg Tabs (Acetaminophen) .... 2 Tabs By Mouth Every 8 Hours Scheduled For  Arthritis 10)  Compression Stockings .... Wear As Much As Tolerated Disp: Medium Grade, Knee High, 1 Pair  Allergies (verified): No Known Drug Allergies  Review of Systems       SEe hPI  Physical Exam  General:  Well-developed,well-nourished,in no acute distress; alert,appropriate and cooperative throughout examination Lungs:  Normal respiratory effort, chest expands symmetrically. Lungs are clear to auscultation, no crackles or wheezes. Heart:  Normal rate and regular rhythm. S1 and S2 normal without gallop, murmur, click, rub or other extra sounds. Msk:  Hip: R ROM IR: 10 Deg, ER: 10 Deg, Flexion: 90 Deg, Extension: 30 Deg, Abduction: 30 Deg, Adduction: 305 Deg Strength IR: 4/5, ER: 4/5, Flexion: 4/5, Extension: 4/5, Abduction: 4/5, Adduction: 4/5 Walks with kyphotic stance. Unable to stand on one foot without cane. Greater trochanter without tenderness to palpation. No tenderness over piriformis and greater trochanter. No SI joint tenderness and normal minimal SI movement.    Impression & Recommendations:  Problem # 1:  DEGENERATIVE JOINT DISEASE, RIGHT HIP (ICD-715.95) Assessment Deteriorated Severe DJD R hip, end stage but not operative candidate. Tramadol working so pt to cont this. RTC if tramadol no longer working and we will consider hip joint (femoroacetabular) injection.  His updated  medication list for this problem includes:    Adult Aspirin Ec Low Strength 81 Mg Tbec (Aspirin) .Marland Kitchen... Daily    Tramadol Hcl 50 Mg Tabs (Tramadol hcl) .Marland Kitchen... 1 tab by mouth every 6 hours as needed for breakthrough pain    Tylenol Extra Strength 500 Mg Tabs (Acetaminophen) .Marland Kitchen... 2 tabs by mouth every 8 hours scheduled for arthritis  Orders: [facility]- Est  Level 4 (99214)  Problem # 2:  PREVENTIVE HEALTH CARE (ICD-V70.0) Assessment: Comment Only Pneumovax, Td. RTC 6 months for routine checkup.  Orders: [facility]- Est  Level 4 (40981)  Problem # 3:  HYPERTENSION, BENIGN SYSTEMIC  (ICD-401.1) Assessment: Improved BP ok today.  NO changes.  His updated medication list for this problem includes:    Enalapril Maleate 20 Mg Tabs (Enalapril maleate) .Marland Kitchen... Take 2 tablet by mouth once a day    Hydrochlorothiazide 25 Mg Tabs (Hydrochlorothiazide) .Marland Kitchen... Take one tab daily  Orders: [facility]- Est  Level 4 (19147)  Complete Medication List: 1)  Enalapril Maleate 20 Mg Tabs (Enalapril maleate) .... Take 2 tablet by mouth once a day 2)  Hydrochlorothiazide 25 Mg Tabs (Hydrochlorothiazide) .... Take one tab daily 3)  Zocor 10 Mg Tabs (Simvastatin) .... Take 1 tablet by mouth once a day 4)  Adult Aspirin Ec Low Strength 81 Mg Tbec (Aspirin) .... Daily 5)  Tramadol Hcl 50 Mg Tabs (Tramadol hcl) .Marland Kitchen.. 1 tab by mouth every 6 hours as needed for breakthrough pain 6)  Ensure/fiber Liqd (Nutritional supplements) .... One shake three times a day between meals 7)  Cyanocobalamin 1000 Mcg/ml Soln (Cyanocobalamin) .... Inject im q monthly for 6 months, first dose beginning of april 2011 8)  Aricept 5 Mg Tabs (Donepezil hcl) .... One tab by mouth qhs 9)  Tylenol Extra Strength 500 Mg Tabs (Acetaminophen) .... 2 tabs by mouth every 8 hours scheduled for arthritis 10)  Compression Stockings  .... Wear as much as tolerated disp: medium grade, knee high, 1 pair  Patient Instructions: 1)  Great to see you, 2)  Your hip arthritis is severe however the surgeons don't want to operate. 3)  Keep using the tramadol until it doesn't help anymore and we will consider a hip joint injection. 4)  Pneumonia and tetanus shots today. 5)  Come back to see me in 6 months. 6)  -Dr. Karie Schwalbe.  Last Flu Vaccine:  Fluvax MCR (05/25/2009 11:05:30 AM) Flu Vaccine Next Due:  1 yr Last Creatinine:  1.03 (01/12/2010 6:34:00 PM) Creatinine Next Due: 1 yr Last Potassium:  3.6 (01/12/2010 6:34:00 PM) Potassium Next Due:  1 yr    Prevention & Chronic Care Immunizations   Influenza vaccine: Fluvax MCR  (05/25/2009)    Influenza vaccine due: 05/25/2010    Tetanus booster: Not documented    Pneumococcal vaccine: Not documented    H. zoster vaccine: Not documented  Colorectal Screening   Hemoccult: Done.  (05/02/2004)   Hemoccult due: 05/02/2005    Colonoscopy: Not documented  Other Screening   PSA: Not documented   Smoking status: quit  (04/10/2010)  Lipids   Total Cholesterol: 160  (10/14/2008)   LDL: 87  (10/14/2008)   LDL Direct: Not documented   HDL: 63  (10/14/2008)   Triglycerides: 49  (10/14/2008)    SGOT (AST): 31  (10/11/2009)   SGPT (ALT): 47  (10/11/2009)   Alkaline phosphatase: 80  (10/11/2009)   Total bilirubin: 0.4  (10/11/2009)    Lipid flowsheet reviewed?: Yes   Progress toward LDL goal:  At goal  Hypertension   Last Blood Pressure: 136 / 83  (04/10/2010)   Serum creatinine: 1.03  (01/12/2010)   Serum potassium 3.6  (01/12/2010)    Hypertension flowsheet reviewed?: Yes   Progress toward BP goal: At goal  Self-Management Support :   Personal Goals (by the next clinic visit) :      Personal blood pressure goal: 140/90  (11/27/2009)     Personal LDL goal: 100  (11/27/2009)    Hypertension self-management support: Written self-care plan  (11/27/2009)    Lipid self-management support: Not documented     Appended Document: F/U/KH   Tetanus/Td Vaccine    Vaccine Type: Td    Site: left deltoid    Mfr: Sanofi Pasteur    Dose: 0.5 ml    Route: IM    Given by: Garen Grams LPN    Exp. Date: 08/24/2011    Lot #: [id number]    VIS given: 06/15/08 version given April 10, 2010.  Pneumovax Vaccine    Vaccine Type: Pneumovax    Site: right deltoid    Mfr: Merck    Dose: 0.5 ml    Route: IM    Given by: Garen Grams LPN    Exp. Date: 08/15/2011    Lot #: [id number]    VIS given: 07/03/09 version given April 10, 2010.

## 2010-08-30 NOTE — Assessment & Plan Note (Signed)
Summary: F/U PER DR. LINTHAVONG/[facility]   Vital Signs:  Patient profile:   77 year old male Weight:      143 pounds Pulse rate:   84 / minute BP sitting:   120 / 66  (left arm)  Vitals Entered By: Renato Battles slade,cma CC: f/up last OV Is Patient Diabetic? No Pain Assessment Patient in pain? no        Primary Care Provider:  Magnus Ivan MD  CC:  f/up last OV.  History of Present Illness: [name] [name] comes in today for follow-up of last visit, specifically ortho referral and ophtho referral.  WHen asked why he is here today he states "to get the shot in my arm".  1) Ortho referral made last visit for right knee pain.  Seen by Dr. Priscille Kluver at [facility].  See consult note.  Problem is actually right hip - severe end statge OA of right hip - "worst he's seen in years".  Asks that we get TB test to be sure that is not going on as it apparently appears so bad he feels an underlying issue other than OA may be going on.  Would also like CT of the hip to better look at it.  Also feels the replacement would be a more complicated surgery than usual, requiring transfusions.  He wants our opinion on if he would be strong enough to undergo such a surgery and the rehab that follows it. [name] [name] is able to relate all this to [place] as well.  HE is ambulatory iwth a cane.  Has tylenol and tramadol.  Uses the tramadol one tab 4 x a day and it controls his pain "well enough".  2) Ophtho - patient states he has cataracts but isn't sure he can afford the surgery.   Habits & Providers  Alcohol-Tobacco-Diet     Tobacco Status: never  Allergies: No Known Drug Allergies  Social History: Smoking Status:  never  Physical Exam  General:  VS Reviewed. Elderly thin male, walks with a four prong walker favoring right leg  Msk:  FROM of bilateral shoulders Neurologic:  alert & oriented X3.  Difficult to understand his speech secondary to adentulous state.   Psych:  Oriented X3, memory intact for recent and remote,  normally interactive, good eye contact, not anxious appearing, and not depressed appearing.     Impression & Recommendations:  Problem # 1:  DEGENERATIVE JOINT DISEASE, RIGHT HIP (ICD-715.95) Assessment New  Right knee pain etiology is actually the hip.  Will obtain TB test per ortho request.  Unfortuantely it is Thursday.  Cannot do today because would need ot be read over the weekend.  He will return to nurse clinic on MOnday to have TB test placed.  He should follow-up in [facility] clinic.  Consider CT per orthor request at next appt.  His updated medication list for this problem includes:    Adult Aspirin Ec Low Strength 81 Mg Tbec (Aspirin) .Marland Kitchen... Daily    Tramadol Hcl 50 Mg Tabs (Tramadol hcl) .Marland Kitchen... 1 tab by mouth every 6 hours as needed for breakthrough pain    Tylenol Extra Strength 500 Mg Tabs (Acetaminophen) .Marland Kitchen... 2 tabs by mouth every 8 hours scheduled for arthritis  Orders: [facility]- Est Level  3 (45409)  Complete Medication List: 1)  Enalapril Maleate 20 Mg Tabs (Enalapril maleate) .... Take 2 tablet by mouth once a day 2)  Hydrochlorothiazide 25 Mg Tabs (Hydrochlorothiazide) .... Take one tab daily 3)  Zocor 10 Mg Tabs (  Simvastatin) .... Take 1 tablet by mouth once a day 4)  Adult Aspirin Ec Low Strength 81 Mg Tbec (Aspirin) .... Daily 5)  Tramadol Hcl 50 Mg Tabs (Tramadol hcl) .Marland Kitchen.. 1 tab by mouth every 6 hours as needed for breakthrough pain 6)  Ensure/fiber Liqd (Nutritional supplements) .... One shake three times a day between meals 7)  Cyanocobalamin 1000 Mcg/ml Soln (Cyanocobalamin) .... Inject im q monthly for 6 months, first dose beginning of april 2011 8)  Aricept 5 Mg Tabs (Donepezil hcl) .... One tab by mouth qhs 9)  Tylenol Extra Strength 500 Mg Tabs (Acetaminophen) .... 2 tabs by mouth every 8 hours scheduled for arthritis

## 2010-08-30 NOTE — Miscellaneous (Signed)
  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

## 2010-08-30 NOTE — Assessment & Plan Note (Signed)
Summary: swelling to legs/[facility]   Vital Signs:  Patient profile:   77 year old male Height:      72 inches Weight:      155.4 pounds BMI:     21.15 Temp:     98.1 degrees F oral Pulse rate:   101 / minute BP sitting:   141 / 88  (right arm) Cuff size:   regular  Vitals Entered By: Jimmy Footman, CMA (July 18, 2010 11:47 AM) CC: right hip pain and right knee swelling Is Patient Diabetic? No Pain Assessment Patient in pain? yes        Primary Care Huda Petrey:  Rodney Langton MD  CC:  right hip pain and right knee swelling.  History of Present Illness: 77 yo male with R knee pain.  Pt has extensive hx OA at multi sites, most notably his R hip end stage DJD, ortho will not operate given his age/frailty.  Today his pain is R knee, felt inside.  No swelling.  Taking his tramadol as rx'ed and this helps some.  No popping, catching, locking but he does have significant pain and crepitus.  He ambulates daily to the bus stop.  He is amenable to intra-articular injection.  No fevers/chills/rashes/trauma.  Habits & Providers  Alcohol-Tobacco-Diet     Tobacco Status: quit  Current Medications (verified): 1)  Enalapril Maleate 20 Mg Tabs (Enalapril Maleate) .... Take 2 Tablet By Mouth Once A Day 2)  Hydrochlorothiazide 25 Mg  Tabs (Hydrochlorothiazide) .... Take One Tab Daily 3)  Zocor 10 Mg Tabs (Simvastatin) .... Take 1 Tablet By Mouth Once A Day 4)  Adult Aspirin Ec Low Strength 81 Mg Tbec (Aspirin) .... Daily 5)  Tramadol Hcl 50 Mg Tabs (Tramadol Hcl) .Marland Kitchen.. 1 Tab By Mouth Every 6 Hours As Needed For Breakthrough Pain 6)  Ensure/fiber  Liqd (Nutritional Supplements) .... One Shake Three Times A Day Between Meals 7)  Cyanocobalamin 1000 Mcg/ml Soln (Cyanocobalamin) .... Inject Im Q Monthly For 6 Months, First Dose Beginning of April 2011 8)  Aricept 5 Mg Tabs (Donepezil Hcl) .... One Tab By Mouth Qhs 9)  Tylenol Extra Strength 500 Mg Tabs (Acetaminophen) .... 2 Tabs By Mouth Every  8 Hours Scheduled For Arthritis 10)  Compression Stockings .... Wear As Much As Tolerated Disp: Medium Grade, Knee High, 1 Pair  Allergies (verified): No Known Drug Allergies  Review of Systems       See HPI  Physical Exam  General:  Well-developed,well-nourished,in no acute distress; alert,appropriate and cooperative throughout examination Msk:  R Knee: Normal to inspection with no erythema or effusion or obvious bony abnormalities. Palpation normal with no warmth or joint line tenderness or patellar tenderness or condyle tenderness. ROM 10 deg to 90 deg.. Ligaments with solid consistent endpoints including ACL, PCL, LCL, MCL. Negative Mcmurray's and provocative meniscal tests. Non painful patellar compression. Patellar and quadriceps tendons unremarkable. Hamstring and quadriceps strength is normal.   Additional Exam:  MSK [place]: Patellar Tendon: Unremarkable. Ant medial joint line and meniscus: Unremarkable. Ant lateral joint line and meniscus: Large osteophyte seen impinging on meniscus. post medial joint line and meniscus: Unremarkable. Post-lateral  joint line and meniscus:  Unremarkable. Popliteal vasculature unremarkable with compressible popliteal vein. Images saved.  Consent obtained and verified. Sterile betadine prep. Furthur cleansed with alcohol. Topical analgesic spray: Ethyl chloride. Joint: R knee Approached in typical fashion with: Needle advanced just medial to inferior patellar pole Completed without difficulty Meds:1cc kenalog 40, 4 cc lidocaine 1% Needle:  25g Aftercare instructions and Red flags advised.     Impression & Recommendations:  Problem # 1:  OSTEOARTHRITIS, MULTI SITES (ICD-715.98) Assessment Deteriorated End stage at hip. Particularly painful at R knee today. US'ed Injected. Cont tramadol. RTC 1-2 weeks to see how he's doing.  His updated medication list for this problem includes:    Adult Aspirin Ec Low Strength 81 Mg Tbec  (Aspirin) .Marland Kitchen... Daily    Tramadol Hcl 50 Mg Tabs (Tramadol hcl) .Marland Kitchen... 1 tab by mouth every 6 hours as needed for breakthrough pain    Tylenol Extra Strength 500 Mg Tabs (Acetaminophen) .Marland Kitchen... 2 tabs by mouth every 8 hours scheduled for arthritis  Orders: [facility]- Est Level  3 (62130) Injection, large joint- [facility] (86578) US EXTREMITY NON-VASC REAL-TIME IMG (46962)  Complete Medication List: 1)  Enalapril Maleate 20 Mg Tabs (Enalapril maleate) .... Take 2 tablet by mouth once a day 2)  Hydrochlorothiazide 25 Mg Tabs (Hydrochlorothiazide) .... Take one tab daily 3)  Zocor 10 Mg Tabs (Simvastatin) .... Take 1 tablet by mouth once a day 4)  Adult Aspirin Ec Low Strength 81 Mg Tbec (Aspirin) .... Daily 5)  Tramadol Hcl 50 Mg Tabs (Tramadol hcl) .Marland Kitchen.. 1 tab by mouth every 6 hours as needed for breakthrough pain 6)  Ensure/fiber Liqd (Nutritional supplements) .... One shake three times a day between meals 7)  Cyanocobalamin 1000 Mcg/ml Soln (Cyanocobalamin) .... Inject im q monthly for 6 months, first dose beginning of april 2011 8)  Aricept 5 Mg Tabs (Donepezil hcl) .... One tab by mouth qhs 9)  Tylenol Extra Strength 500 Mg Tabs (Acetaminophen) .... 2 tabs by mouth every 8 hours scheduled for arthritis 10)  Compression Stockings  .... Wear as much as tolerated disp: medium grade, knee high, 1 pair  Patient Instructions: 1)  Great to see you, 2)  Ultrasounded your knee. 3)  Cortisone injection into your knee. 4)  Flu shot. 5)  Come back to see me in 1-2 weeks to discuss other issues and see how your knee is doing. 6)  -Dr. Karie Schwalbe.   Orders Added: 1)  [facility]- Est Level  3 [95284] 2)  Injection, large joint- [facility] [20610] 3)  US EXTREMITY NON-VASC REAL-TIME IMG [13244]

## 2010-08-30 NOTE — Assessment & Plan Note (Signed)
Summary: hospital f/u/[facility]illi[facility] pt/eo   Vital Signs:  Patient profile:   77 year old male Weight:      146.2 pounds Temp:     98.7 degrees F oral Pulse rate:   108 / minute Pulse rhythm:   regular BP sitting:   126 / 71  (left arm) Cuff size:   regular  Vitals Entered By: Loralee Pacas CMA (October 11, 2009 8:44 AM)  Primary Care Provider:  Magnus Ivan MD   History of Present Illness: HFU:  was very dehydrated, nausea.    The acute illness has resolved.  He has a terrible home situation with just a friend hat occasionally comes by to help.  See geriatric assessment.  He is malnourished and partially dependent with IADLs.  MMSE 19.  B12 deficiency:  Megaloblastic, had b12 shot in hospital.  Not anemic yet.    Current Medications (verified): 1)  Enalapril Maleate 20 Mg Tabs (Enalapril Maleate) .... Take 2 Tablet By Mouth Once A Day 2)  Hydrochlorothiazide 25 Mg  Tabs (Hydrochlorothiazide) .... Take One Tab Daily 3)  Zocor 10 Mg Tabs (Simvastatin) .... Take 1 Tablet By Mouth Once A Day 4)  Amlodipine Besylate 10 Mg Tabs (Amlodipine Besylate) .Marland Kitchen.. 1 Tab By Mouth Daily 5)  Adult Aspirin Ec Low Strength 81 Mg Tbec (Aspirin) .... Daily 6)  Tramadol Hcl 50 Mg Tabs (Tramadol Hcl) .... Take 2 Tablets Three Times A Day 7)  Ensure/fiber  Liqd (Nutritional Supplements) .... One Shake Three Times A Day Between Meals 8)  Cyanocobalamin 1000 Mcg/ml Soln (Cyanocobalamin) .... Inject Im Q Monthly For 6 Months, First Dose Beginning of April 2011  Allergies (verified): No Known Drug Allergies  Review of Systems       See HPI  Physical Exam  General:  Well-developed,well-nourished,in no acute distress; alert,appropriate and cooperative throughout examination Lungs:  Normal respiratory effort, chest expands symmetrically. Lungs are clear to auscultation, no crackles or wheezes. Heart:  Normal rate and regular rhythm. S1 and S2 normal without gallop, murmur, click, rub or other extra  sounds. Abdomen:  Bowel sounds positive,abdomen soft and non-tender without masses, organomegaly or hernias noted.   Impression & Recommendations:  Problem # 1:  DEMENTIA (ICD-294.8) MMSE of 19.  Partially dependent with IADLs.  Will refer to [facility] clinic.  [facility] for PCA.  To PCP and geri team, consider aricept?  Orders: Home Health Referral (Home Health) [facility]- Est  Level 4 ([address])  Problem # 2:  VITAMIN B12 DEFICIENCY (ICD-266.2) Needs b12 shots q monthly for 6 months.  I wonder if this is contributing to his dementia.  First shot will be at the beginning of Apil 2011.  Orders: [facility]- Est  Level 4 ([address]) Comp Met-[facility] ([address]454-09811) CBC-[facility] (91478) Miscellaneous Lab Charge-[facility] (29562)  Problem # 3:  MALNUTRITION (ICD-263.9) See #2, added ensure between meals.  Checking prealbumin.  This is the likely cause of his low b12 levels.  Meals on wheels was brought up by pts friend.  Will look into this.    Orders: [facility]- Est  Level 4 ([address]) Comp Met-[facility] (13086-57846) CBC-[facility] (96295) Miscellaneous Lab Charge-[facility] (28413)  Complete Medication List: 1)  Enalapril Maleate 20 Mg Tabs (Enalapril maleate) .... Take 2 tablet by mouth once a day 2)  Hydrochlorothiazide 25 Mg Tabs (Hydrochlorothiazide) .... Take one tab daily 3)  Zocor 10 Mg Tabs (Simvastatin) .... Take 1 tablet by mouth once a day 4)  Amlodipine Besylate 10 Mg Tabs (Amlodipine besylate) .Marland Kitchen.. 1 tab by  mouth daily 5)  Adult Aspirin Ec Low Strength 81 Mg Tbec (Aspirin) .... Daily 6)  Tramadol Hcl 50 Mg Tabs (Tramadol hcl) .... Take 2 tablets three times a day 7)  Ensure/fiber Liqd (Nutritional supplements) .... One shake three times a day between meals 8)  Cyanocobalamin 1000 Mcg/ml Soln (Cyanocobalamin) .... Inject im q monthly for 6 months, first dose beginning of april 2011  Patient Instructions: 1)  To the lab, 2)  start ensure between meals 3)  come back every month for vitamin b12 injections. 4)  [facility] personal care  assistant to help with daily activities. 5)  -Dr. Karie Schwalbe. Prescriptions: ENSURE/FIBER  LIQD (NUTRITIONAL SUPPLEMENTS) One shake three times a day between meals  #1 month x 11   Entered and Authorized by:   Rodney Langton MD   Signed by:   Rodney Langton MD on 10/11/2009   Method used:   Electronically to        [facility] (retail)       [address]       [place], [place]  [id number]       Ph: [phone]       Fax: [phone]   RxID:   [id number]    Geriatric Assessment:  Activities of Daily Living:    Bathing-independent    Dressing-independent    Eating-independent    Toileting-independent    Transferring-assisted    Continence-independent Overall Assessment: independent  Instrumental Activities of Daily Living:    Transportation-assisted    Meal/Food Preparation-independent    Shopping Errands-assisted    Housekeeping/Chores-assisted    Money Management/Finances-assisted    Medication Management-independent    Ability to Use Telephone-independent    Laundry-independent Overall Assessment: partially dependent  Mental Status Exam: (value/max value)    Orientation to Time: 4/5    Orientation to Place: 4/5    Registration: 3/3    Attention/Calculation: 0/5    Recall: 0/3    Language-name 2 objects: 2/2    Language-repeat: 1/1    Language-follow 3-step command: 2/3    Language-read and follow direction: 1/1    Write a sentence: 1/1    Copy design: 1/1 MSE Total score: 19/30  Appended Document: Geriatric Clinic Referral.    Clinical Lists Changes  Orders: Added new Referral order of Misc. Referral (Misc. Ref) - Signed

## 2010-08-30 NOTE — Letter (Signed)
Summary: Generic Letter  [facility]  [address]   [place], [place] [address]   Phone: [phone]  Fax: [phone]    01/18/2010  CORTNEY MCKINNEY [address] [place], [place]  [address]  Dear Mr. HANDEL,   This letter is to inform you that your lab work was normal.  Please continue to follow Dr Lucienne Minks instructions and keep your apt next week.    Sincerely,   Gladstone Pih

## 2010-08-30 NOTE — Miscellaneous (Signed)
Summary: no show/tlb  Clinical Lists Changes

## 2010-10-04 ENCOUNTER — Ambulatory Visit (INDEPENDENT_AMBULATORY_CARE_PROVIDER_SITE_OTHER): Payer: Medicare Other | Admitting: Family Medicine

## 2010-10-04 ENCOUNTER — Encounter: Payer: Self-pay | Admitting: Family Medicine

## 2010-10-04 DIAGNOSIS — I10 Essential (primary) hypertension: Secondary | ICD-10-CM

## 2010-10-04 DIAGNOSIS — C61 Malignant neoplasm of prostate: Secondary | ICD-10-CM

## 2010-10-04 DIAGNOSIS — F068 Other specified mental disorders due to known physiological condition: Secondary | ICD-10-CM

## 2010-10-04 DIAGNOSIS — E7800 Pure hypercholesterolemia, unspecified: Secondary | ICD-10-CM

## 2010-10-04 DIAGNOSIS — M1990 Unspecified osteoarthritis, unspecified site: Secondary | ICD-10-CM

## 2010-10-04 MED ORDER — LISINOPRIL-HYDROCHLOROTHIAZIDE 10-12.5 MG PO TABS: 1.0000 | ORAL_TABLET | Freq: Every day | ORAL | Status: DC

## 2010-10-04 MED ORDER — ACETAMINOPHEN ER 650 MG PO TBCR: 1300.0000 mg | EXTENDED_RELEASE_TABLET | Freq: Two times a day (BID) | ORAL | Status: DC

## 2010-10-04 NOTE — Assessment & Plan Note (Addendum)
He last scored on MMSE:  05/25/09: 23 10/01/09: 19 Normal TSH and RPR on admission last year. Vitamin B12 was low x 1 with monthly b12 planned, but hasn't been followed up. Would recheck Vitamin B12 level and CBC He is independent with most of his ADLs and IADLs. He would benefit from aricept as he is moderately demented. As he had strange dreams, we will change this medication to qAM.

## 2010-10-04 NOTE — Assessment & Plan Note (Addendum)
He does complain of significant pill burden. As his BP has been very well controlled in the past we will switch him to ACE/Thiazide combo low dose to reduce pill burden. Recheck in office

## 2010-10-04 NOTE — Assessment & Plan Note (Signed)
Severe in the R knee and R hip. Also with likely spinal stenosis with current posture. Will make tylenol 650mg  PO 4x a day scheduled. Tramadol 50 for breakthrough pain. Shower seat and 3-in-1 chair for toileting. Will have [facility] make a home assessment.

## 2010-10-04 NOTE — Assessment & Plan Note (Signed)
The patient endorses that he has recovered from this. We have no records but I can request this. I also don't see that he has had a PSA in years so we will address this at his next office visit.

## 2010-10-04 NOTE — Progress Notes (Signed)
  Subjective:    Patient ID: [name] [name], male    DOB: Feb 28, 1932, 76 y.o.   MRN: [id number]  HPI Geriatrics team visited [name] today at his housing at [address]  His house is a single level, multi-room abode.  His stove, washer/dryer, fridge are all in the same room.  His bed is stacked with 3 mattresses but he notes no difficulty getting into it.  He has a pail next to the bed for voiding which he notes approx 4x a night.  Bathroom is small, there is a bar for holding onto in bathtub.  Toilet is very low and he often grips the sink for stability.  He is overall compliant with his meds however doesn't really take any of his aricept or tramadol.  Aricept gave him strange dreams.  He lives alone but has a cell phone with him at all times to call 911 if help needed.  He has a daughter nearby.  He also has several friends in the vicinity.  He takes the bus to office visits.     Review of Systems    See HPI Objective:   Physical Exam  Constitutional:       Thin african [place] male, extremely kyphotic.  NAD.  Musculoskeletal:       R knee somewhat larger than left. ROM limited in extension to 5 deg.   R hip has no IR/ER.  Flexion/extension severely limited L hip has approx 5-10 deg of IR/ER. He walks with a 4 point cane held in his right hand with a shuffling, slow, hunched gait.  Lots of trouble going from standing to seated and seated to standing.  Uses arms a lot to push off of seat  Skin: Skin is warm and dry.          Assessment & Plan:

## 2010-10-08 ENCOUNTER — Other Ambulatory Visit: Payer: Self-pay | Admitting: Sports Medicine

## 2010-10-08 DIAGNOSIS — Z8546 Personal history of malignant neoplasm of prostate: Secondary | ICD-10-CM

## 2010-10-08 DIAGNOSIS — F068 Other specified mental disorders due to known physiological condition: Secondary | ICD-10-CM

## 2010-10-08 DIAGNOSIS — I10 Essential (primary) hypertension: Secondary | ICD-10-CM

## 2010-10-08 NOTE — Assessment & Plan Note (Signed)
Overdue for fasting lipid profile

## 2010-10-12 ENCOUNTER — Other Ambulatory Visit: Payer: Medicare Other | Admitting: *Deleted

## 2010-10-19 ENCOUNTER — Ambulatory Visit (INDEPENDENT_AMBULATORY_CARE_PROVIDER_SITE_OTHER): Payer: Medicare Other | Admitting: Sports Medicine

## 2010-10-19 ENCOUNTER — Encounter: Payer: Self-pay | Admitting: Sports Medicine

## 2010-10-19 DIAGNOSIS — E785 Hyperlipidemia, unspecified: Secondary | ICD-10-CM

## 2010-10-19 DIAGNOSIS — Z8546 Personal history of malignant neoplasm of prostate: Secondary | ICD-10-CM

## 2010-10-19 DIAGNOSIS — F068 Other specified mental disorders due to known physiological condition: Secondary | ICD-10-CM

## 2010-10-19 DIAGNOSIS — C61 Malignant neoplasm of prostate: Secondary | ICD-10-CM

## 2010-10-19 DIAGNOSIS — D539 Nutritional anemia, unspecified: Secondary | ICD-10-CM

## 2010-10-19 DIAGNOSIS — I10 Essential (primary) hypertension: Secondary | ICD-10-CM

## 2010-10-19 DIAGNOSIS — E538 Deficiency of other specified B group vitamins: Secondary | ICD-10-CM

## 2010-10-19 DIAGNOSIS — M1990 Unspecified osteoarthritis, unspecified site: Secondary | ICD-10-CM

## 2010-10-19 DIAGNOSIS — E7800 Pure hypercholesterolemia, unspecified: Secondary | ICD-10-CM

## 2010-10-19 LAB — DIFFERENTIAL
Basophils Absolute: 0 10*3/uL (ref 0.0–0.1)
Basophils Absolute: 0 10*3/uL (ref 0.0–0.1)
Basophils Relative: 0 % (ref 0–1)
Basophils Relative: 0 % (ref 0–1)
Eosinophils Relative: 0 % (ref 0–5)
Monocytes Absolute: 0.9 10*3/uL (ref 0.1–1.0)
Monocytes Absolute: 1 10*3/uL (ref 0.1–1.0)
Monocytes Relative: 7 % (ref 3–12)
Neutro Abs: 8.4 10*3/uL — ABNORMAL HIGH (ref 1.7–7.7)
Neutrophils Relative %: 79 % — ABNORMAL HIGH (ref 43–77)

## 2010-10-19 LAB — LIPID PANEL
Cholesterol: 160 mg/dL (ref 0–200)
Triglycerides: 43 mg/dL (ref <150)

## 2010-10-19 LAB — COMPREHENSIVE METABOLIC PANEL
ALT: 11 U/L (ref 0–53)
Alkaline Phosphatase: 79 U/L (ref 39–117)
Alkaline Phosphatase: 70 U/L (ref 39–117)
BUN: 47 mg/dL — ABNORMAL HIGH (ref 6–23)
CO2: 23 mEq/L (ref 19–32)
Chloride: 101 mEq/L (ref 96–112)
Creat: 1.12 mg/dL (ref 0.40–1.50)
Creatinine, Ser: 1.77 mg/dL — ABNORMAL HIGH (ref 0.4–1.5)
GFR calc non Af Amer: 38 mL/min — ABNORMAL LOW (ref >60)
Glucose, Bld: 123 mg/dL — ABNORMAL HIGH (ref 70–99)
Glucose, Bld: 90 mg/dL (ref 70–99)
Potassium: 5.2 mEq/L — ABNORMAL HIGH (ref 3.5–5.1)
Sodium: 138 mEq/L (ref 135–145)
Total Bilirubin: 0.5 mg/dL (ref 0.3–1.2)
Total Bilirubin: 0.7 mg/dL (ref 0.3–1.2)
Total Protein: 7.1 g/dL (ref 6.0–8.3)

## 2010-10-19 LAB — URINE CULTURE: Culture: NO GROWTH

## 2010-10-19 LAB — RAPID URINE DRUG SCREEN, HOSP PERFORMED
Barbiturates: NOT DETECTED
Benzodiazepines: NOT DETECTED
Cocaine: NOT DETECTED

## 2010-10-19 LAB — TROPONIN I: Troponin I: 0.02 ng/mL (ref 0.00–0.06)

## 2010-10-19 LAB — URINALYSIS, ROUTINE W REFLEX MICROSCOPIC
Glucose, UA: NEGATIVE mg/dL
Ketones, ur: 15 mg/dL — AB
Nitrite: NEGATIVE
Protein, ur: NEGATIVE mg/dL
pH: 5 (ref 5.0–8.0)

## 2010-10-19 LAB — CBC
HCT: 39.5 % (ref 39.0–52.0)
HCT: 45.4 % (ref 39.0–52.0)
Hemoglobin: 14.2 g/dL (ref 13.0–17.0)
Hemoglobin: 15.4 g/dL (ref 13.0–17.0)
MCH: 34.2 pg — ABNORMAL HIGH (ref 26.0–34.0)
MCHC: 33 g/dL (ref 30.0–36.0)
MCHC: 34 g/dL (ref 30.0–36.0)
MCHC: 34.4 g/dL (ref 30.0–36.0)
MCV: 103.7 fL — ABNORMAL HIGH (ref 78.0–100.0)
MCV: 103.7 fL — ABNORMAL HIGH (ref 78.0–100.0)
Platelets: 204 10*3/uL (ref 150–400)
Platelets: 222 10*3/uL (ref 150–400)
Platelets: 225 10*3/uL (ref 150–400)
RBC: 4.38 MIL/uL (ref 4.22–5.81)
RDW: 12.9 % (ref 11.5–15.5)
RDW: 13.1 % (ref 11.5–15.5)
WBC: 9.9 10*3/uL (ref 4.0–10.5)

## 2010-10-19 LAB — BASIC METABOLIC PANEL
BUN: 18 mg/dL (ref 6–23)
BUN: 34 mg/dL — ABNORMAL HIGH (ref 6–23)
Calcium: 8.8 mg/dL (ref 8.4–10.5)
Creatinine, Ser: 1.44 mg/dL (ref 0.4–1.5)
GFR calc Af Amer: >60 mL/min (ref >60)
GFR calc non Af Amer: 48 mL/min — ABNORMAL LOW (ref >60)
GFR calc non Af Amer: >60 mL/min (ref >60)
Glucose, Bld: 125 mg/dL — ABNORMAL HIGH (ref 70–99)
Potassium: 4.9 mEq/L (ref 3.5–5.1)

## 2010-10-19 LAB — POCT I-STAT, CHEM 8
Calcium, Ion: 1.12 mmol/L (ref 1.12–1.32)
Glucose, Bld: 135 mg/dL — ABNORMAL HIGH (ref 70–99)
HCT: 45 % (ref 39.0–52.0)
TCO2: 27 mmol/L (ref 0–100)

## 2010-10-19 LAB — RPR: RPR Ser Ql: NONREACTIVE

## 2010-10-19 LAB — PSA: PSA: 0.58 ng/mL (ref <=4.00)

## 2010-10-19 LAB — VITAMIN B12
Vitamin B-12: 135 pg/mL — ABNORMAL LOW (ref 211–911)
Vitamin B-12: 97 pg/mL — ABNORMAL LOW (ref 211–911)

## 2010-10-19 LAB — TSH: TSH: 1.536 u[IU]/mL (ref 0.350–4.500)

## 2010-10-19 NOTE — Assessment & Plan Note (Addendum)
Severe in R knee and R hip. Injected R knee today, pt with complete symptom relief. Requesting injection in L knee as well. Will defer to later visit. [facility] referral in place for home safety durable equipment. He has not yet picked up with acetaminophen 650. Tramadol for breakthrough pain.

## 2010-10-19 NOTE — Assessment & Plan Note (Signed)
Lipid panel today

## 2010-10-19 NOTE — Assessment & Plan Note (Signed)
The patient endorses that he has recovered from this. We have no records. Since no PSA done in years, will recheck today and treat accordingly.

## 2010-10-19 NOTE — Progress Notes (Signed)
  Subjective:    Patient ID: [name] [name], male    DOB: [birth year], 77 y.o.   MRN: [id number]  HPI  HTN:  Elevated but didn't take BP meds today.  Usually runs well controlled.  Dementia: MMSE 19 earlier this month.  Has not yet started taking aricept though he was told to during the home visit.  R knee pain: Due for injection.  Last injection lasted 3 months.  B12:  Noted low at last hospitalization.  Prostate: Hx prostate Ca, no repeat/f/u PSA in chart seen.   Review of Systems     See HPI Objective:   Physical Exam  Constitutional:       Thin african [place] male, extremely kyphotic.  NAD.  Cardiovascular: Normal rate and normal heart sounds.  Exam reveals no gallop and no friction rub.   No murmur heard. Pulmonary/Chest: Breath sounds normal. No respiratory distress. He has no wheezes. He has no rales. He exhibits no tenderness.  Musculoskeletal:       R knee somewhat larger than left. ROM limited in extension to 5 deg.   R hip has no IR/ER.  Flexion/extension severely limited L hip has approx 5-10 deg of IR/ER. He walks with a 4 point cane held in his right hand with a shuffling, slow, hunched gait.  Skin: Skin is warm and dry.   Consent obtained and verified. Sterile betadine prep. Furthur cleansed with alcohol. Topical analgesic spray: Ethyl chloride. Joint: R knee Approached in typical fashion with: Completed without difficulty Meds: 1cc kenalog 40, 4cc lidocaine 1%. Aftercare instructions and Red flags advised.    Assessment & Plan:

## 2010-10-19 NOTE — Assessment & Plan Note (Signed)
He last scored on MMSE:  05/25/09: 23 10/01/09: 19 Normal TSH and RPR on admission last year. Vitamin B12 was low x 1 with monthly b12 planned, but hasn't been followed up.  Will recheck Vitamin B12 level and CBC today. He is independent with most of his ADLs and IADLs. Re-advised to restart aricept PO qAM, can augment q3 months if needed.

## 2010-10-19 NOTE — Assessment & Plan Note (Signed)
Elevated but hasn't taken meds yet.  No changes today.

## 2010-10-19 NOTE — Patient Instructions (Signed)
Great to see you, Checking some bloodwork. Injected knee. Come back to see me in 3 months. -Dr. Karie Schwalbe.

## 2010-10-20 ENCOUNTER — Encounter: Payer: Self-pay | Admitting: Sports Medicine

## 2010-10-20 DIAGNOSIS — D539 Nutritional anemia, unspecified: Secondary | ICD-10-CM | POA: Insufficient documentation

## 2010-10-20 MED ORDER — CYANOCOBALAMIN 1000 MCG/ML IJ SOLN: INTRAMUSCULAR | Status: DC

## 2010-10-20 NOTE — Assessment & Plan Note (Signed)
Noted very low b12. Will go ahead and complete w/u with anemia panel as not to miss folate deficiency. Will also have pt come in for monthly B12 injections.

## 2010-10-20 NOTE — Progress Notes (Signed)
Addended by: Rodney Langton on: 10/20/2010 09:07 AM   Modules accepted: Orders

## 2010-10-21 LAB — VITAMIN D 1,25 DIHYDROXY
Vitamin D 1, 25 (OH)2 Total: 49 pg/mL (ref 18–72)
Vitamin D3 1, 25 (OH)2: 49 pg/mL

## 2010-10-23 ENCOUNTER — Telehealth: Payer: Self-pay | Admitting: Sports Medicine

## 2010-10-23 NOTE — Telephone Encounter (Signed)
Pt checking status of orders for his wheel chair & home health.

## 2010-10-24 NOTE — Telephone Encounter (Signed)
Can you also make sure that he comes in for B12 injections, he was really low and needs these ASAP.

## 2010-10-24 NOTE — Telephone Encounter (Signed)
Home health referral placed already, tonya maybe you can help looking into where we are with that referral. We never placed an order for a wheelchair.

## 2010-10-29 NOTE — Telephone Encounter (Signed)
Spoke to pt and told him that he needed to come in and have B12 inj done.Laureen Ochs, Viann Shove

## 2010-10-31 ENCOUNTER — Encounter: Payer: Self-pay | Admitting: Home Health Services

## 2010-10-31 ENCOUNTER — Ambulatory Visit: Payer: Medicare Other

## 2010-11-08 ENCOUNTER — Ambulatory Visit (INDEPENDENT_AMBULATORY_CARE_PROVIDER_SITE_OTHER): Payer: Medicare Other | Admitting: *Deleted

## 2010-11-08 DIAGNOSIS — D539 Nutritional anemia, unspecified: Secondary | ICD-10-CM

## 2010-11-08 MED ORDER — CYANOCOBALAMIN 1000 MCG/ML IJ SOLN
1000.0000 ug | Freq: Once | INTRAMUSCULAR | Status: AC
  Administered 2010-11-08: 1000 ug via INTRAMUSCULAR

## 2010-11-29 ENCOUNTER — Other Ambulatory Visit: Payer: Self-pay | Admitting: Sports Medicine

## 2010-11-29 NOTE — Telephone Encounter (Signed)
Refill request

## 2010-12-07 ENCOUNTER — Ambulatory Visit (INDEPENDENT_AMBULATORY_CARE_PROVIDER_SITE_OTHER): Payer: Medicare Other | Admitting: *Deleted

## 2010-12-07 DIAGNOSIS — D539 Nutritional anemia, unspecified: Secondary | ICD-10-CM

## 2010-12-07 MED ORDER — CYANOCOBALAMIN 1000 MCG/ML IJ SOLN
1000.0000 ug | Freq: Once | INTRAMUSCULAR | Status: AC
  Administered 2010-12-07: 1000 ug via INTRAMUSCULAR

## 2011-01-03 ENCOUNTER — Ambulatory Visit: Payer: Medicare Other

## 2011-01-08 ENCOUNTER — Encounter: Payer: Self-pay | Admitting: Sports Medicine

## 2011-01-16 ENCOUNTER — Ambulatory Visit: Payer: Medicare Other

## 2011-03-05 ENCOUNTER — Ambulatory Visit (INDEPENDENT_AMBULATORY_CARE_PROVIDER_SITE_OTHER): Payer: Medicare Other | Admitting: Family Medicine

## 2011-03-05 ENCOUNTER — Encounter: Payer: Self-pay | Admitting: Family Medicine

## 2011-03-05 DIAGNOSIS — G8929 Other chronic pain: Secondary | ICD-10-CM

## 2011-03-05 DIAGNOSIS — M1990 Unspecified osteoarthritis, unspecified site: Secondary | ICD-10-CM

## 2011-03-05 DIAGNOSIS — F068 Other specified mental disorders due to known physiological condition: Secondary | ICD-10-CM

## 2011-03-05 DIAGNOSIS — D539 Nutritional anemia, unspecified: Secondary | ICD-10-CM

## 2011-03-05 DIAGNOSIS — M25569 Pain in unspecified knee: Secondary | ICD-10-CM

## 2011-03-05 DIAGNOSIS — I10 Essential (primary) hypertension: Secondary | ICD-10-CM

## 2011-03-05 MED ORDER — DICLOFENAC SODIUM 1 % TD GEL: 1.0000 "application " | Freq: Four times a day (QID) | TRANSDERMAL | Status: AC

## 2011-03-05 MED ORDER — CYANOCOBALAMIN 1000 MCG/ML IJ SOLN
1000.0000 ug | Freq: Once | INTRAMUSCULAR | Status: AC
  Administered 2011-03-05: 1000 ug via INTRAMUSCULAR

## 2011-03-05 NOTE — Patient Instructions (Signed)
It was nice to meet you today. Please pick up new medication, Voltaren, from pharmacy and apply to affected knee 4 times/day as needed for pain. I will refer you to Physical Therapy to help strengthen your leg muscles and follow up recommendations for walkers/devices. Continue to walk and exercise as tolerated.  Keep up the good work. Please schedule a follow up appointment with me in 4 months.

## 2011-03-06 ENCOUNTER — Encounter: Payer: Self-pay | Admitting: Family Medicine

## 2011-03-06 NOTE — Assessment & Plan Note (Signed)
BP elevated today 164/87.   Patient has dementia, so difficult to assess compliance. Will continue Lisinopril-HCTZ for now. Recheck in 3 months.

## 2011-03-06 NOTE — Assessment & Plan Note (Addendum)
Patient's pain was mild today, so will defer steroid injection to a later date. Will give Rx for Voltaren gel to be applied topically 4 times daily. Will refer patient to outpatient PT for deconditioning/assistance with medical devices. Continue current regimen - Tylenol, Ultram prn.  Avoid NSAIDS. Patient to return in 3 months.

## 2011-03-06 NOTE — Assessment & Plan Note (Signed)
Continue Aricept.  Will repeat MMSE at next visit. Consider referral to [facility] clinic for both deconditioning and progressive dementia. Will follow up PT recommendations. B12 injections today.

## 2011-03-06 NOTE — Progress Notes (Signed)
  Subjective:    Patient ID: [name] [name], male    DOB: 1932-03-15, 77 y.o.   MRN: [id number]  HPI  Patient presents to meet new MD and follow chronic knee pain.  Poor historian secondary to dementia.  Patient was seen by Dr. Karie Schwalbe. Previously and has had multiple steroid injections in the past.  Last injection was 3/23 in R knee.  Current knee pain is mild - 1/10.  Patient says pain is most severe with ambulating and minimal at rest.  He asks for a scooter or walker with wheels because he walks to and from bus everyday.  Pain is better with Tylenol and Tramadol prn, but difficult to assess how often patient taking medications.  Pain is intermittent.  BP elevated today.  Patient states that he took BP medication this morning.  Denies any headache, numbness/tingling, fatigue, blurry vision, chest pain.  Denies any fever, chills, NS, nausea/vomiting, abdominal pain, dysuria, constipation, or diarrhea.  Review of Systems  Per HPI    Objective:   Physical Exam  Constitutional: He appears well-nourished. No distress.  Neck: Neck supple.  Cardiovascular: Normal rate, regular rhythm and intact distal pulses.   No murmur heard. Pulmonary/Chest: Breath sounds normal. No respiratory distress. He has no wheezes. He has no rales.  Musculoskeletal:       Trace edema bilaterally.  Mild pain with flexion and extension.  Shuffled, but steady gait with walking device.  Functional ambulation scale: 5   Skin: Skin is dry. No rash noted.  Psychiatric: He has a normal mood and affect. Thought content normal. His speech is tangential. He is not slowed. He exhibits normal recent memory.          Assessment & Plan:

## 2011-03-11 ENCOUNTER — Telehealth: Payer: Self-pay | Admitting: Family Medicine

## 2011-03-11 NOTE — Telephone Encounter (Signed)
Sure, I will call him back, but quick question.  Do you know if PT has called patient to set up a time and date?  If not, I will ask him.

## 2011-03-11 NOTE — Telephone Encounter (Signed)
Hi all, I tried calling the home phone number on "snapshot" and the number is not valid.  I also tried the work number and it just rang forever.  I also asked the operator to call and he said it was not valid.  Do you have a different phone number I can try?

## 2011-03-11 NOTE — Telephone Encounter (Signed)
[name] [name] is calling about the Therapy that Dr. Tye Savoy ordered.  He would like to speak with Dr. Tye Savoy.

## 2011-03-11 NOTE — Telephone Encounter (Signed)
[phone]  

## 2011-03-11 NOTE — Telephone Encounter (Signed)
Thanks for the new number.  I called it and apparently he has a voice mailbox that has not been set up yet.  If he calls again, just let him know the referral is being processed or ask him what specific questions he has.  Thanks again.

## 2011-03-18 ENCOUNTER — Telehealth: Payer: Self-pay | Admitting: Family Medicine

## 2011-03-18 NOTE — Telephone Encounter (Signed)
Dustine Stickler is calling because he needs a [place] or something.  He said he can't even go to the [employer] or anything.  He wants to speak with someone.

## 2011-03-26 NOTE — Telephone Encounter (Signed)
Has this been addressed?

## 2011-03-26 NOTE — Telephone Encounter (Signed)
A referral for physical therapy was ordered on 03/05/11.  Will someone from PT schedule appointment time and date?

## 2011-03-27 NOTE — Telephone Encounter (Signed)
[facility] faxed to [facility]

## 2011-04-05 ENCOUNTER — Ambulatory Visit (INDEPENDENT_AMBULATORY_CARE_PROVIDER_SITE_OTHER): Payer: Medicare Other | Admitting: *Deleted

## 2011-04-05 ENCOUNTER — Telehealth: Payer: Self-pay | Admitting: *Deleted

## 2011-04-05 DIAGNOSIS — E538 Deficiency of other specified B group vitamins: Secondary | ICD-10-CM

## 2011-04-05 MED ORDER — CYANOCOBALAMIN 1000 MCG/ML IJ SOLN
1000.0000 ug | Freq: Once | INTRAMUSCULAR | Status: AC
  Administered 2011-04-05: 1000 ug via INTRAMUSCULAR

## 2011-04-05 NOTE — Progress Notes (Signed)
Consulted with Dr.Hale and he advises to give B12 injection today ,Cyanocobalamin 1000 mcg IM now.

## 2011-04-05 NOTE — Telephone Encounter (Addendum)
Patient in office today for B12 and asks about referral to PT. Advised will  check on this on Monday to make  sure this has been done.

## 2011-04-09 ENCOUNTER — Encounter: Payer: Self-pay | Admitting: *Deleted

## 2011-04-11 NOTE — Telephone Encounter (Signed)
Spoke with Huntley Dec and she will resend.

## 2011-04-12 NOTE — Telephone Encounter (Signed)
Was resent  

## 2011-04-15 ENCOUNTER — Other Ambulatory Visit: Payer: Self-pay | Admitting: Family Medicine

## 2011-04-17 ENCOUNTER — Other Ambulatory Visit: Payer: Self-pay | Admitting: *Deleted

## 2011-04-17 DIAGNOSIS — D539 Nutritional anemia, unspecified: Secondary | ICD-10-CM

## 2011-04-17 MED ORDER — CYANOCOBALAMIN 1000 MCG/ML IJ SOLN: 1000.0000 ug | INTRAMUSCULAR | Status: DC

## 2011-05-08 ENCOUNTER — Ambulatory Visit (INDEPENDENT_AMBULATORY_CARE_PROVIDER_SITE_OTHER): Payer: Medicare Other | Admitting: *Deleted

## 2011-05-08 DIAGNOSIS — Z23 Encounter for immunization: Secondary | ICD-10-CM

## 2011-05-08 DIAGNOSIS — D539 Nutritional anemia, unspecified: Secondary | ICD-10-CM

## 2011-05-08 MED ORDER — CYANOCOBALAMIN 1000 MCG/ML IJ SOLN
1000.0000 ug | Freq: Once | INTRAMUSCULAR | Status: AC
  Administered 2011-05-08: 1000 ug via INTRAMUSCULAR

## 2011-06-03 ENCOUNTER — Telehealth: Payer: Self-pay | Admitting: Family Medicine

## 2011-06-03 ENCOUNTER — Other Ambulatory Visit: Payer: Self-pay | Admitting: Family Medicine

## 2011-06-03 MED ORDER — CAPSAICIN 0.025 % EX CREA: TOPICAL_CREAM | Freq: Two times a day (BID) | CUTANEOUS | Status: DC

## 2011-06-03 NOTE — Telephone Encounter (Signed)
Sent a cream to pharmacy.

## 2011-06-03 NOTE — Telephone Encounter (Signed)
Pt asking to speak with RN about a cream he was supposed to get, says he lost the Rx and needs [place] to call it into [facility] pharmacy

## 2011-06-03 NOTE — Telephone Encounter (Signed)
I'm not sure what he asking about but I will send a Rx for capsaicin cream for knee pain.

## 2011-06-03 NOTE — Telephone Encounter (Signed)
Ivy, I don't see the rx this patient is talking about. If there is one can you send it in to [facility]

## 2011-06-04 NOTE — Telephone Encounter (Signed)
attempted

## 2011-06-04 NOTE — Telephone Encounter (Signed)
Attempted to call patient. Cannot leave message due to it not being set up yet

## 2011-06-04 NOTE — Telephone Encounter (Signed)
Attempted again to reach pt. NA and no VM set up.

## 2011-06-11 ENCOUNTER — Ambulatory Visit (INDEPENDENT_AMBULATORY_CARE_PROVIDER_SITE_OTHER): Payer: Medicare Other | Admitting: Family Medicine

## 2011-06-11 ENCOUNTER — Encounter: Payer: Self-pay | Admitting: Family Medicine

## 2011-06-11 DIAGNOSIS — M1990 Unspecified osteoarthritis, unspecified site: Secondary | ICD-10-CM

## 2011-06-11 DIAGNOSIS — I10 Essential (primary) hypertension: Secondary | ICD-10-CM

## 2011-06-11 NOTE — Patient Instructions (Signed)
It was nice to see you again. I highly recommend you go to a physical therapist - I will send another referral. Go to your pharmacy and pick up new blood pressure medication and Capsaicin cream for you knee. Please schedule a follow up appointment at [facility] in 3 months.

## 2011-06-17 ENCOUNTER — Encounter: Payer: Self-pay | Admitting: Family Medicine

## 2011-06-17 MED ORDER — LISINOPRIL-HYDROCHLOROTHIAZIDE 20-12.5 MG PO TABS: 1.0000 | ORAL_TABLET | Freq: Every day | ORAL | Status: DC

## 2011-06-17 NOTE — Assessment & Plan Note (Addendum)
BP elevated likely secondary to non-compliance. - Will increase to Lisinopril-HCTZ 20-12.5 - Patient to follow up with me or GERI clinic in 3 months

## 2011-06-17 NOTE — Assessment & Plan Note (Signed)
Pain is worse in right knee, but no worse than before. - Advised patient to pick up Capsaicin cream and apply as directed - Continue taking Tylenol and Ultram prn for pain - Will place another referral to PT for help with medical devices/deconditioning - Recommended follow up in GERI clinic in 3 months - May consider steroid injection at next visit

## 2011-06-17 NOTE — Progress Notes (Signed)
  Subjective:    Patient ID: [name] [name], male    DOB: 05-Jan-1932, 77 y.o.   MRN: [id number]  HPI  Patient presents to clinic for routine follow up knee pain and HTN:  HTN: BP is elevated today 170/90.  Patient admits to taking medication, however he has dementia.  Current regimen: Lisinopril-HCTZ 10-12.5.  He denies any CP, fatigue, changes in vision, weakness, headache, or numbness/tingling of extremities.  Knee pain, bilateral:  Pain is mostly in right knee, chronic.  He has a walker which he uses to get from house to bus stop for transportation.  He used to see Dr. Karie Schwalbe. For steroid injections, but those only helped temporarily.  Patient says he takes Tylenol and Ultram, but I cannot know for sure.  He never picked up his Rx for Capsaicin cream (it is still at pharmacy).  Patient able to ambulate, but very slowly.  Physical therapy referral in process.  I have reviewed PMH, medications, allergies, and problem list.  Review of Systems  Per HPI    Objective:   Physical Exam  Constitutional: No distress.  Cardiovascular: Regular rhythm and normal pulses.  Exam reveals distant heart sounds.   No murmur heard. Pulmonary/Chest: Effort normal and breath sounds normal. He has no wheezes. He has no rales.  Musculoskeletal:       Right knee: He exhibits decreased range of motion and bony tenderness. He exhibits no swelling, no effusion, no ecchymosis and no erythema. tenderness found.  Neurological:       Walks slowly, hunched over with walker         Assessment & Plan:

## 2011-07-08 ENCOUNTER — Ambulatory Visit: Payer: Medicare Other | Attending: Family Medicine

## 2011-11-06 ENCOUNTER — Ambulatory Visit: Payer: Medicare Other | Admitting: Family Medicine

## 2011-11-20 ENCOUNTER — Encounter: Payer: Self-pay | Admitting: Family Medicine

## 2011-11-20 ENCOUNTER — Ambulatory Visit (INDEPENDENT_AMBULATORY_CARE_PROVIDER_SITE_OTHER): Payer: Medicare Other | Admitting: Family Medicine

## 2011-11-20 VITALS — BP 140/70 | HR 82 | Temp 97.3°F | Wt 170.0 lb

## 2011-11-20 DIAGNOSIS — I10 Essential (primary) hypertension: Secondary | ICD-10-CM

## 2011-11-20 DIAGNOSIS — M1990 Unspecified osteoarthritis, unspecified site: Secondary | ICD-10-CM

## 2011-11-20 DIAGNOSIS — E7800 Pure hypercholesterolemia, unspecified: Secondary | ICD-10-CM

## 2011-11-20 MED ORDER — TRAMADOL HCL 50 MG PO TABS: 50.0000 mg | ORAL_TABLET | Freq: Four times a day (QID) | ORAL | Status: DC | PRN

## 2011-11-20 MED ORDER — LISINOPRIL-HYDROCHLOROTHIAZIDE 20-25 MG PO TABS: 1.0000 | ORAL_TABLET | Freq: Every day | ORAL | Status: DC

## 2011-11-20 MED ORDER — DONEPEZIL HCL 5 MG PO TABS: 5.0000 mg | ORAL_TABLET | ORAL | Status: DC

## 2011-11-20 MED ORDER — SIMVASTATIN 10 MG PO TABS: 10.0000 mg | ORAL_TABLET | Freq: Every day | ORAL | Status: DC

## 2011-11-20 MED ORDER — ACETAMINOPHEN ER 650 MG PO TBCR: 650.0000 mg | EXTENDED_RELEASE_TABLET | Freq: Three times a day (TID) | ORAL | Status: DC | PRN

## 2011-11-20 NOTE — Assessment & Plan Note (Signed)
Patient's pain is well-controlled on tramadol and Tylenol. Will check LFTs at next visit in 6 months.

## 2011-11-20 NOTE — Assessment & Plan Note (Signed)
Patient's blood pressure today was 140/70. Will increase lisinopril HCTZ to 20-25 mg one tablet daily. Recheck at next visit.

## 2011-11-20 NOTE — Progress Notes (Signed)
  Subjective:    Patient ID: [name] [name], male    DOB: 1931-10-29, 77 y.o.   MRN: [id number]  HPI  Patient presents to clinic for medication refills. Patient says that he is out of several medications due to cost. He did take his blood pressure medications today, however he brought in several empty medication bottles and was not sure which he should be taking.  Hypertension: Patient's blood pressure was 140/70. Current regimen includes lisinopril HCTZ 20-12.5 mg daily. Patient denies any nausea or vomiting, chest pain, fatigue, shortness of breath, headache. Patient says that he is doing well overall.  Hyperlipidemia: Patient's last lipid panel was drawn in 2012. He is due for another fasting lipid panel this year. He continues to take Zocor 10 mg daily. Denies any side effects.  Osteoarthritis, right knee: Patient says knee has not been bothering him as much lately.  Patient continues to take Tylenol and tramadol as needed for pain. Patient is able to walk with his cane without difficulty. He denies any recurrent falls. Denies any associated symptoms.  I reviewed patient's medications extensively.  I reviewed his PMH, SH, Problem List, and Allergies.  Review of Systems  Per HPI    Objective:   Physical Exam  Constitutional: No distress.       Pleasant  Neck: Neck supple.  Cardiovascular: Normal rate, regular rhythm and normal heart sounds.   Pulmonary/Chest: Effort normal and breath sounds normal. He has no wheezes. He has no rales.  Musculoskeletal:       Trace ankle edema bilaterally RT knee: limited ROM with flexion and extension, but no pain on palpation, able to walk several steps with walker without difficulty, no swelling, erythema, or effusion appreciated  Skin: Skin is dry.          Assessment & Plan:

## 2011-11-20 NOTE — Patient Instructions (Signed)
It was good to see you today. Continue taking Zocor, Lisinopril-HCTZ, Aspirin, and Aricept as directed. For pain, continue Tylenol and Tramadol as needed. I will fill out your [employer] bus forms for you and let you know when it is ready. You are due for labs, so schedule a lab appointment in the morning and do not eat or drink anything before lab visit. Schedule follow up appointment with Dr. Tye Savoy in 6 months.

## 2011-11-20 NOTE — Assessment & Plan Note (Signed)
Patient is due for a fasting lipid panel. I've ordered future labs and patient is to return to clinic in 6 months and have labs drawn prior to his appointment. Continue Zocor 10 for now.

## 2011-11-20 NOTE — Progress Notes (Signed)
Addended by: Tye Savoy, Shavaun Osterloh on: 11/20/2011 04:10 PM   Modules accepted: Orders

## 2011-12-02 ENCOUNTER — Telehealth: Payer: Self-pay | Admitting: Family Medicine

## 2011-12-02 NOTE — Telephone Encounter (Signed)
LMOM informing of below.  

## 2011-12-02 NOTE — Telephone Encounter (Signed)
Please call patient and inform him that I have completed Physician portion of SCAT application.  He needs to stop by our clinic to fill out Part A (Patient portion).  Then, we would be happy to fax it for him.  Thanks!

## 2012-03-02 ENCOUNTER — Telehealth: Payer: Self-pay | Admitting: Family Medicine

## 2012-03-02 DIAGNOSIS — R269 Unspecified abnormalities of gait and mobility: Secondary | ICD-10-CM

## 2012-03-02 NOTE — Telephone Encounter (Signed)
I placed a referral for Home Health PT to evaluate [name] [name] his gait abnormalities.

## 2012-03-02 NOTE — Telephone Encounter (Signed)
Pt is asking for a cane to help him walk and he said something to help elevate his leg

## 2012-03-02 NOTE — Telephone Encounter (Signed)
Put in [facility] workqueue by donna loring

## 2012-03-03 ENCOUNTER — Telehealth: Payer: Self-pay

## 2012-03-03 NOTE — Telephone Encounter (Signed)
Chart opened by mistake

## 2012-05-07 ENCOUNTER — Ambulatory Visit (INDEPENDENT_AMBULATORY_CARE_PROVIDER_SITE_OTHER): Payer: Medicare Other | Admitting: Family Medicine

## 2012-05-07 ENCOUNTER — Encounter: Payer: Self-pay | Admitting: Family Medicine

## 2012-05-07 VITALS — BP 156/72 | HR 88 | Temp 97.9°F

## 2012-05-07 DIAGNOSIS — Z23 Encounter for immunization: Secondary | ICD-10-CM

## 2012-05-07 DIAGNOSIS — I10 Essential (primary) hypertension: Secondary | ICD-10-CM

## 2012-05-07 DIAGNOSIS — M1990 Unspecified osteoarthritis, unspecified site: Secondary | ICD-10-CM

## 2012-05-07 MED ORDER — LISINOPRIL 40 MG PO TABS: 40.0000 mg | ORAL_TABLET | Freq: Every day | ORAL | Status: DC

## 2012-05-07 MED ORDER — CAPSAICIN 0.025 % EX CREA: TOPICAL_CREAM | Freq: Two times a day (BID) | CUTANEOUS | Status: AC

## 2012-05-07 MED ORDER — HYDROCHLOROTHIAZIDE 25 MG PO TABS: 25.0000 mg | ORAL_TABLET | Freq: Every day | ORAL | Status: DC

## 2012-05-07 NOTE — Patient Instructions (Addendum)
Your blood pressure was elevated today. Go to your pharmacy and pick up Lisinopril 40 mg and HCTZ 25 mg.  Take as directed. For knee pain, apply Capsaicin cream to your knee twice per day as needed for pain. Schedule an appointment with Dr. Tye Savoy in 2 weeks for a steroid injection.  DASH Diet The DASH diet stands for "Dietary Approaches to Stop Hypertension." It is a healthy eating plan that has been shown to reduce high blood pressure (hypertension) in as little as 14 days, while also possibly providing other significant health benefits. These other health benefits include reducing the risk of breast cancer after menopause and reducing the risk of type 2 diabetes, heart disease, colon cancer, and stroke. Health benefits also include weight loss and slowing kidney failure in patients with chronic kidney disease.  DIET GUIDELINES  Limit salt (sodium). Your diet should contain less than 1500 mg of sodium daily.  Limit refined or processed carbohydrates. Your diet should include mostly whole grains. Desserts and added sugars should be used sparingly.  Include small amounts of heart-healthy fats. These types of fats include nuts, oils, and tub margarine. Limit saturated and trans fats. These fats have been shown to be harmful in the body. CHOOSING FOODS  The following food groups are based on a 2000 calorie diet. See your Registered Dietitian for individual calorie needs. Grains and Grain Products (6 to 8 servings daily)  Eat More Often: Whole-wheat bread, brown rice, whole-grain or wheat pasta, quinoa, popcorn without added fat or salt (air popped).  Eat Less Often: White bread, white pasta, white rice, cornbread. Vegetables (4 to 5 servings daily)  Eat More Often: Fresh, frozen, and canned vegetables. Vegetables may be raw, steamed, roasted, or grilled with a minimal amount of fat.  Eat Less Often/Avoid: Creamed or fried vegetables. Vegetables in a cheese sauce. Fruit (4 to 5 servings  daily)  Eat More Often: All fresh, canned (in natural juice), or frozen fruits. Dried fruits without added sugar. One hundred percent fruit juice ( cup [237 mL] daily).  Eat Less Often: Dried fruits with added sugar. Canned fruit in light or heavy syrup. [employer], Fish, and Poultry (2 servings or less daily. One serving is 3 to 4 oz [85-114 g]).  Eat More Often: Ninety percent or leaner ground beef, tenderloin, sirloin. Round cuts of beef, chicken breast, [place] breast. All fish. Grill, bake, or broil your meat. Nothing should be fried.  Eat Less Often/Avoid: Fatty cuts of meat, [place], or chicken leg, thigh, or wing. Fried cuts of meat or fish. Dairy (2 to 3 servings)  Eat More Often: Low-fat or fat-free milk, low-fat plain or light yogurt, reduced-fat or part-skim cheese.  Eat Less Often/Avoid: Milk (whole, 2%).Whole milk yogurt. Full-fat cheeses. Nuts, Seeds, and Legumes (4 to 5 servings per week)  Eat More Often: All without added salt.  Eat Less Often/Avoid: Salted nuts and seeds, canned beans with added salt. Fats and Sweets (limited)  Eat More Often: Vegetable oils, tub margarines without trans fats, sugar-free gelatin. Mayonnaise and salad dressings.  Eat Less Often/Avoid: Coconut oils, palm oils, butter, stick margarine, cream, half and half, cookies, candy, pie. FOR MORE INFORMATION The Dash Diet Eating Plan: www.dashdiet.org Document Released: 07/04/2011 Document Revised: 10/07/2011 Document Reviewed: 07/04/2011 [facility] Patient Information 2013 [place], [place].

## 2012-05-08 ENCOUNTER — Other Ambulatory Visit: Payer: Self-pay | Admitting: Family Medicine

## 2012-05-08 NOTE — Telephone Encounter (Signed)
Lavera Guise, RN at [facility] calling to clarify dose of Lisinopril/HCTZ.  Informed patient should be taking Lisinopril 40mg  daily and HCTZ 25mg  daily per office visit yesterday.  Also, wants to know if patient should still be on Vit. B12 injections.  If so, will need to fax order for:  Vitamin B12 injection every 30 days with diagnosis and to fill pill box every 2 weeks.  Can fax order to [phone]---attn: [facility].  Will also need to send refill of Vit. B12 refill to [facility] to pick up and administer to patient.  Will route note to Dr. Tye Savoy and call back.  Gaylene Brooks, RN

## 2012-05-10 ENCOUNTER — Encounter: Payer: Self-pay | Admitting: Family Medicine

## 2012-05-10 NOTE — Assessment & Plan Note (Signed)
RT knee more painful than LT.  Patient says pain well-controlled on Tylenol 500. - Will refill Tramadol and Capsaicin cream - Completed form for public transportation - Patient to schedule follow up appointment with me for steroid injection - Will consider home PT services if patient's mobility continues to decline

## 2012-05-10 NOTE — Progress Notes (Signed)
  Subjective:    Patient ID: [name] [name], male    DOB: 1932/01/27, 78 y.o.   MRN: [id number]  HPI  Hypertension: BP elevated today.  Patient cannot remember which medications he takes for HTN.  He has dementia and does not answer my questions appropriately.  He denies any symptoms today.  He would rather talk about how he takes the bus everywhere and how crowded the buses can be.  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  Pain is located RT knee and does not radiate.  Pain is worse with exertion, better with rest.  He ambulates with a walker.  Takes Tylenol 500 twice a day almost every day.  He used to rub Capsaicin cream on knee which was helpful, but he has run out of it.  Patient asking if he can have a "shot" for pain today.  Review of Systems  Per HPI    Objective:   Physical Exam  HENT:  Head: Normocephalic and atraumatic.  Cardiovascular: Normal rate and normal heart sounds.   Pulmonary/Chest: Effort normal and breath sounds normal. He has no wheezes. He has no rales.  Musculoskeletal:       Right knee: He exhibits decreased range of motion and bony tenderness. He exhibits no swelling, no effusion and no erythema.          Assessment & Plan:

## 2012-05-10 NOTE — Assessment & Plan Note (Signed)
BP elevated today.  Unsure if patient is compliant with medications due to his dementia. - Will repeat BP in 2 weeks - May consider SW consult to see if patient qualifies for home health aid for medication assistance

## 2012-05-22 ENCOUNTER — Ambulatory Visit: Payer: Medicare Other | Admitting: Family Medicine

## 2012-06-02 ENCOUNTER — Ambulatory Visit: Payer: Medicare Other | Admitting: Family Medicine

## 2012-08-17 ENCOUNTER — Ambulatory Visit: Payer: Medicare Other | Admitting: Family Medicine

## 2012-09-17 ENCOUNTER — Telehealth: Payer: Self-pay | Admitting: *Deleted

## 2012-09-17 NOTE — Telephone Encounter (Signed)
Unable to schedule pt,Dr Delacruz aware. [name] [name], Virgel Bouquet

## 2012-09-17 NOTE — Telephone Encounter (Signed)
Message copied by Tanna Savoy on Thu Sep 17, 2012  9:18 AM ------      Message from: DE LA CRUZ, IVY      Created: Wed Sep 16, 2012 12:40 PM      Regarding: Geriatric clinic       Hi again.  Could we try to schedule this patient to come in tomorrow (2/20) for Geriatric clinic.   He takes the bus so hopefully he can make it on time.  Again, if not tomorrow, then let's try for next week.            Thanks everyone,       Lajoyce Corners ------

## 2012-09-21 ENCOUNTER — Telehealth: Payer: Self-pay | Admitting: Family Medicine

## 2012-09-21 NOTE — Telephone Encounter (Signed)
Hi team, can you try to schedule this patient to come in this Thursday for Geriatric clinic?  If he cannot come to [place], we can try to schedule a HOME visit at 11:30 AM.  Thanks and keep me posted!

## 2012-09-22 NOTE — Telephone Encounter (Signed)
Left message for a rtn call. [name] [name]  

## 2012-11-04 ENCOUNTER — Ambulatory Visit: Payer: Medicare Other | Admitting: Family Medicine

## 2013-01-04 ENCOUNTER — Ambulatory Visit: Payer: Medicare Other | Admitting: Family Medicine

## 2013-01-21 ENCOUNTER — Ambulatory Visit: Payer: Medicare Other | Admitting: Family Medicine

## 2013-03-01 ENCOUNTER — Ambulatory Visit: Payer: Medicare Other | Admitting: Family Medicine

## 2013-06-22 ENCOUNTER — Encounter: Payer: Medicare Other | Admitting: Family Medicine

## 2013-08-13 ENCOUNTER — Ambulatory Visit: Payer: Medicare Other | Admitting: Family Medicine

## 2013-09-10 ENCOUNTER — Encounter: Payer: Self-pay | Admitting: Family Medicine

## 2013-09-10 ENCOUNTER — Ambulatory Visit (INDEPENDENT_AMBULATORY_CARE_PROVIDER_SITE_OTHER): Payer: Medicare Other | Admitting: Family Medicine

## 2013-09-10 VITALS — BP 175/88 | HR 64 | Temp 97.5°F

## 2013-09-10 DIAGNOSIS — E7800 Pure hypercholesterolemia, unspecified: Secondary | ICD-10-CM

## 2013-09-10 DIAGNOSIS — M1990 Unspecified osteoarthritis, unspecified site: Secondary | ICD-10-CM

## 2013-09-10 DIAGNOSIS — I10 Essential (primary) hypertension: Secondary | ICD-10-CM

## 2013-09-10 DIAGNOSIS — E785 Hyperlipidemia, unspecified: Secondary | ICD-10-CM

## 2013-09-10 DIAGNOSIS — M25561 Pain in right knee: Secondary | ICD-10-CM

## 2013-09-10 DIAGNOSIS — F0391 Unspecified dementia with behavioral disturbance: Secondary | ICD-10-CM

## 2013-09-10 DIAGNOSIS — F03918 Unspecified dementia, unspecified severity, with other behavioral disturbance: Secondary | ICD-10-CM

## 2013-09-10 DIAGNOSIS — M25569 Pain in unspecified knee: Secondary | ICD-10-CM

## 2013-09-10 MED ORDER — LISINOPRIL 40 MG PO TABS: 40.0000 mg | ORAL_TABLET | Freq: Every day | ORAL | Status: DC

## 2013-09-10 MED ORDER — DONEPEZIL HCL 5 MG PO TABS: 5.0000 mg | ORAL_TABLET | ORAL | Status: DC

## 2013-09-10 MED ORDER — TRAMADOL HCL 50 MG PO TABS
50.0000 mg | ORAL_TABLET | Freq: Four times a day (QID) | ORAL | Status: DC | PRN
Start: 2013-09-10 — End: 2013-12-12

## 2013-09-10 MED ORDER — HYDROCHLOROTHIAZIDE 25 MG PO TABS
25.0000 mg | ORAL_TABLET | Freq: Every day | ORAL | Status: DC
Start: 2013-09-10 — End: 2013-12-30

## 2013-09-10 MED ORDER — SIMVASTATIN 10 MG PO TABS
10.0000 mg | ORAL_TABLET | Freq: Every day | ORAL | Status: DC
Start: 2013-09-10 — End: 2018-06-23

## 2013-09-10 NOTE — Patient Instructions (Signed)
Thank you for coming in,   I will call you with the results of your lab results today. I refilled your medications. I also put for a referral for home health (physical therapy and nursing).   I will also have our team do an in home visit to help further your care.     Please feel free to call with any questions or concerns at any time, at [phone]. --Dr. Raeford Razor

## 2013-09-10 NOTE — Progress Notes (Signed)
Subjective:     Patient ID: [name] [name], male   DOB: [birth year], 80 y.o.   MRN: [id number]  HPI [name] [name] is here for annual physical exam, HTN f/u and knee pain.  Patient accompanied by his sister to aid with history of illnesses.   HTN Disease Monitoring:elevated today but hasn't taken his medications recently due to his prescriptions expiring. Does not measure his BP on a regular basis.  Home BP Monitoring none Chest pain- none    Dyspnea- none Medications: Lisinopril, HCTZ Compliance-  None, patient has not been seen in over a year. Lightheadedness-  no Edema- none  RT Knee pain: Patient has been seen several times in the past for his right knee pain.  X-rays reveal DJD with no fractures.  He has used capsaicin cream and tylenol to help relieve his pain. His ambulation is severally affected by his pain.  It's located only at his knee with no radiation.  Worse when he walks and nothing seems to help currently. He has received an injection to that knee before and relieved his pain about a month.  Rolled in with the clinic wheel chair today but ambulates with a cane.  He uses a rolling walker at home.  Patient has received PT previously and stated that it helped.  He does not remember the last time he participated in PT.    Patient is living alone with no home health. He was previously cared for by his ex-wife but she has recently passed. His daughter expresses concern about his health care.  His ambulation is severely limited by his pain.      Current Outpatient Prescriptions on File Prior to Visit  Medication Sig Dispense Refill  . acetaminophen (TYLENOL) 650 MG CR tablet Take 1 tablet (650 mg total) by mouth every 8 (eight) hours as needed for pain.  90 tablet  5  . aspirin (ADULT ASPIRIN EC LOW STRENGTH) 81 MG EC tablet Take 81 mg by mouth daily.        . cyanocobalamin (,VITAMIN B-12,) 1000 MCG/ML injection Inject 1 mL (1,000 mcg total) into the muscle every 30 (thirty) days.  1  mL  0  . donepezil (ARICEPT) 5 MG tablet Take 1 tablet (5 mg total) by mouth every morning.  30 tablet  6  . hydrochlorothiazide (HYDRODIURIL) 25 MG tablet Take 1 tablet (25 mg total) by mouth daily.  30 tablet  11  . lisinopril (PRINIVIL,ZESTRIL) 40 MG tablet Take 1 tablet (40 mg total) by mouth daily.  30 tablet  11  . Nutritional Supplements (ENSURE/FIBER) LIQD Take by mouth.        . simvastatin (ZOCOR) 10 MG tablet Take 1 tablet (10 mg total) by mouth daily.  30 tablet  5  . traMADol (ULTRAM) 50 MG tablet take 1 tablet by mouth every 6 hours if needed  80 tablet  0   No current facility-administered medications on file prior to visit.    I have reviewed and updated the following as appropriate: allergies, current medications, past family history, past medical history, past social history, past surgical history and problem list SHx: Lives alone. Retired but previously a Retail buyer.   Health Maintenance: Overdue for colonoscopy and flu vaccine. History is limited.   Review of Systems All other systems reviewed and otherwise normal.      Objective:   Physical Exam BP 175/88  Pulse 64  Temp(Src) 97.5 F (36.4 C) (Oral) Gen: NAD, alert, cooperative with exam, fraill-appearing,  African [place] male  HEENT: NCAT, clear conjunctiva,  CV: RRR, good S1/S2, no murmur, no edema, capillary refill brisk  Resp: CTABL, no wheezes, non-labored Skin: no rashes, normal turgor  Neuro: no gross deficits.  Psych: patient answers questions but needs direction  MSK: able to stand but needs much assistance, did not ambulate, severe pain (located to patella) with passive flexion and extension of right knee, no swelling in right knee, left knee with normal range of motion, normal sensation of b/l feet, unable to test hip ROM b/l      Assessment:         Plan:

## 2013-09-11 ENCOUNTER — Encounter: Payer: Self-pay | Admitting: Family Medicine

## 2013-09-11 LAB — BASIC METABOLIC PANEL
BUN: 16 mg/dL (ref 6–23)
CO2: 27 mEq/L (ref 19–32)
CREATININE: 1.14 mg/dL (ref 0.50–1.35)
Calcium: 9.5 mg/dL (ref 8.4–10.5)
Chloride: 104 mEq/L (ref 96–112)
Glucose, Bld: 105 mg/dL — ABNORMAL HIGH (ref 70–99)
POTASSIUM: 4.7 meq/L (ref 3.5–5.3)
Sodium: 138 mEq/L (ref 135–145)

## 2013-09-11 LAB — LIPID PANEL
CHOLESTEROL: 197 mg/dL (ref 0–200)
HDL: 37 mg/dL — ABNORMAL LOW (ref >39)
LDL Cholesterol: 140 mg/dL — ABNORMAL HIGH (ref 0–99)
TRIGLYCERIDES: 99 mg/dL (ref <150)
Total CHOL/HDL Ratio: 5.3 Ratio
VLDL: 20 mg/dL (ref 0–40)

## 2013-09-11 NOTE — Assessment & Plan Note (Addendum)
RT knee pain that severely limits his ambulation. Needs cane and/or walker  - tramadol refilled  - referral for home health: PT evaluation - possible knee injection in the future if able to establish in home visits.  - will contact [facility] in order for home visits as his daughter is not able to bring him to clinic regularly. This is evident by his several missed appointments and no follow up in over a year.  His daughter is also worried about his ability to care for himself. Stating that if something happens to him, he would not be able to get to the phone unless it is within an arms length.  He has two sister and his daughter Li Fragoso [phone] Home; [phone] work).  I told the daughter that she will need to have a discussion with him in order to determine a HCPOA if there was something to happen to him.  - He is currently making his own health care decisions but will need a MMSE in order to determine capacity.  He has told his daughter that he does not want to be placed in a nursing home.  His daughter expressed the concern that he needs help getting around his house that she is  unable to provide. She is amenable to SNF placement.   - will need to determine risk for falls and safety at home.

## 2013-09-11 NOTE — Assessment & Plan Note (Addendum)
Lipid panel today.  Currently on simvastatin 10 mg daily. Unsure of compliance  - Risk calculation does not warrant any statin therapy  - will maintain current regimen unless any adverse effects arise, if so..discontinue.

## 2013-09-11 NOTE — Assessment & Plan Note (Addendum)
Refilled medications.  - discussed with Dr. Mingo Amber  - Referral to home health: Nursing  - BMP normal  - will need f/u but transportation to clinic is limited as well as ambulation  - will contact [facility] team about home visit.

## 2013-09-29 ENCOUNTER — Telehealth: Payer: Self-pay | Admitting: Sports Medicine

## 2013-09-29 NOTE — Telephone Encounter (Signed)
LVM regarding setting up a home visit.  Available to set up for either Thursday, March 5 at 1030 AM, Thursday, March 12 at 11 AM.  Patient is to call back and informed front office.  Please forward any messages directly to me.

## 2013-10-08 ENCOUNTER — Telehealth: Payer: Self-pay | Admitting: *Deleted

## 2013-10-08 NOTE — Telephone Encounter (Signed)
LMOVM for pt to return call.  Please ask if he has received flu shot.  If so, when and where.  If not, and he is interested please have him make a nurse visit. Takasha Vetere, Salome Spotted

## 2013-12-12 ENCOUNTER — Emergency Department (HOSPITAL_COMMUNITY): Payer: Medicare Other

## 2013-12-12 ENCOUNTER — Encounter (HOSPITAL_COMMUNITY): Payer: Self-pay | Admitting: Emergency Medicine

## 2013-12-12 ENCOUNTER — Inpatient Hospital Stay (HOSPITAL_COMMUNITY)
Admission: EM | Admit: 2013-12-12 | Discharge: 2013-12-15 | DRG: 682 | Disposition: A | Discharge status: Skilled Nursing Facility | Payer: Medicare Other | Attending: Family Medicine | Admitting: Family Medicine

## 2013-12-12 DIAGNOSIS — E860 Dehydration: Secondary | ICD-10-CM | POA: Diagnosis present

## 2013-12-12 DIAGNOSIS — Z8546 Personal history of malignant neoplasm of prostate: Secondary | ICD-10-CM

## 2013-12-12 DIAGNOSIS — H1030 Unspecified acute conjunctivitis, unspecified eye: Secondary | ICD-10-CM | POA: Diagnosis present

## 2013-12-12 DIAGNOSIS — I129 Hypertensive chronic kidney disease with stage 1 through stage 4 chronic kidney disease, or unspecified chronic kidney disease: Secondary | ICD-10-CM | POA: Diagnosis present

## 2013-12-12 DIAGNOSIS — D219 Benign neoplasm of connective and other soft tissue, unspecified: Secondary | ICD-10-CM | POA: Diagnosis present

## 2013-12-12 DIAGNOSIS — N182 Chronic kidney disease, stage 2 (mild): Secondary | ICD-10-CM | POA: Diagnosis present

## 2013-12-12 DIAGNOSIS — W19XXXA Unspecified fall, initial encounter: Secondary | ICD-10-CM | POA: Diagnosis present

## 2013-12-12 DIAGNOSIS — D539 Nutritional anemia, unspecified: Secondary | ICD-10-CM | POA: Diagnosis present

## 2013-12-12 DIAGNOSIS — I10 Essential (primary) hypertension: Secondary | ICD-10-CM

## 2013-12-12 DIAGNOSIS — F0390 Unspecified dementia without behavioral disturbance: Secondary | ICD-10-CM | POA: Diagnosis present

## 2013-12-12 DIAGNOSIS — IMO0002 Reserved for concepts with insufficient information to code with codable children: Secondary | ICD-10-CM | POA: Diagnosis present

## 2013-12-12 DIAGNOSIS — M159 Polyosteoarthritis, unspecified: Secondary | ICD-10-CM | POA: Diagnosis present

## 2013-12-12 DIAGNOSIS — M6282 Rhabdomyolysis: Secondary | ICD-10-CM | POA: Diagnosis present

## 2013-12-12 DIAGNOSIS — Y92009 Unspecified place in unspecified non-institutional (private) residence as the place of occurrence of the external cause: Secondary | ICD-10-CM

## 2013-12-12 DIAGNOSIS — E538 Deficiency of other specified B group vitamins: Secondary | ICD-10-CM | POA: Diagnosis present

## 2013-12-12 DIAGNOSIS — E43 Unspecified severe protein-calorie malnutrition: Secondary | ICD-10-CM | POA: Diagnosis present

## 2013-12-12 DIAGNOSIS — E7800 Pure hypercholesterolemia, unspecified: Secondary | ICD-10-CM | POA: Diagnosis present

## 2013-12-12 DIAGNOSIS — N179 Acute kidney failure, unspecified: Principal | ICD-10-CM | POA: Diagnosis present

## 2013-12-12 DIAGNOSIS — Z23 Encounter for immunization: Secondary | ICD-10-CM

## 2013-12-12 DIAGNOSIS — E785 Hyperlipidemia, unspecified: Secondary | ICD-10-CM | POA: Diagnosis present

## 2013-12-12 DIAGNOSIS — N390 Urinary tract infection, site not specified: Secondary | ICD-10-CM | POA: Diagnosis present

## 2013-12-12 DIAGNOSIS — R609 Edema, unspecified: Secondary | ICD-10-CM | POA: Diagnosis present

## 2013-12-12 DIAGNOSIS — Z7982 Long term (current) use of aspirin: Secondary | ICD-10-CM

## 2013-12-12 DIAGNOSIS — S0180XA Unspecified open wound of other part of head, initial encounter: Secondary | ICD-10-CM | POA: Diagnosis present

## 2013-12-12 HISTORY — DX: Malignant (primary) neoplasm, unspecified: C80.1

## 2013-12-12 HISTORY — DX: Essential (primary) hypertension: I10

## 2013-12-12 LAB — PROTIME-INR
INR: 1.03 (ref 0.00–1.49)
Prothrombin Time: 13.3 seconds (ref 11.6–15.2)

## 2013-12-12 LAB — URINALYSIS, ROUTINE W REFLEX MICROSCOPIC
GLUCOSE, UA: NEGATIVE mg/dL
KETONES UR: 15 mg/dL — AB
Nitrite: NEGATIVE
PROTEIN: 30 mg/dL — AB
Specific Gravity, Urine: >1.03 — ABNORMAL HIGH (ref 1.005–1.030)
UROBILINOGEN UA: 2 mg/dL — AB (ref 0.0–1.0)
pH: 5 (ref 5.0–8.0)

## 2013-12-12 LAB — RAPID URINE DRUG SCREEN, HOSP PERFORMED
Amphetamines: NOT DETECTED
BENZODIAZEPINES: NOT DETECTED
Barbiturates: NOT DETECTED
Cocaine: NOT DETECTED
Opiates: POSITIVE — AB
Tetrahydrocannabinol: NOT DETECTED

## 2013-12-12 LAB — COMPREHENSIVE METABOLIC PANEL
ALT: 20 U/L (ref 0–53)
AST: 71 U/L — ABNORMAL HIGH (ref 0–37)
Albumin: 3.6 g/dL (ref 3.5–5.2)
Alkaline Phosphatase: 93 U/L (ref 39–117)
BUN: 42 mg/dL — ABNORMAL HIGH (ref 6–23)
CALCIUM: 9.4 mg/dL (ref 8.4–10.5)
CO2: 21 meq/L (ref 19–32)
CREATININE: 1.55 mg/dL — AB (ref 0.50–1.35)
Chloride: 101 mEq/L (ref 96–112)
GFR calc Af Amer: 47 mL/min — ABNORMAL LOW (ref >90)
GFR, EST NON AFRICAN AMERICAN: 41 mL/min — AB (ref >90)
Glucose, Bld: 132 mg/dL — ABNORMAL HIGH (ref 70–99)
Potassium: 4.7 mEq/L (ref 3.7–5.3)
SODIUM: 140 meq/L (ref 137–147)
TOTAL PROTEIN: 7.6 g/dL (ref 6.0–8.3)
Total Bilirubin: 1 mg/dL (ref 0.3–1.2)

## 2013-12-12 LAB — URINE MICROSCOPIC-ADD ON

## 2013-12-12 LAB — CBC WITH DIFFERENTIAL/PLATELET
BASOS ABS: 0 10*3/uL (ref 0.0–0.1)
Basophils Relative: 0 % (ref 0–1)
EOS ABS: 0 10*3/uL (ref 0.0–0.7)
EOS PCT: 0 % (ref 0–5)
HCT: 40.2 % (ref 39.0–52.0)
Hemoglobin: 14.2 g/dL (ref 13.0–17.0)
Lymphocytes Relative: 5 % — ABNORMAL LOW (ref 12–46)
Lymphs Abs: 0.8 10*3/uL (ref 0.7–4.0)
MCH: 34.9 pg — AB (ref 26.0–34.0)
MCHC: 35.3 g/dL (ref 30.0–36.0)
MCV: 98.8 fL (ref 78.0–100.0)
MONO ABS: 1.2 10*3/uL — AB (ref 0.1–1.0)
Monocytes Relative: 7 % (ref 3–12)
Neutro Abs: 15.5 10*3/uL — ABNORMAL HIGH (ref 1.7–7.7)
Neutrophils Relative %: 88 % — ABNORMAL HIGH (ref 43–77)
Platelets: 248 10*3/uL (ref 150–400)
RBC: 4.07 MIL/uL — ABNORMAL LOW (ref 4.22–5.81)
RDW: 13.2 % (ref 11.5–15.5)
WBC: 17.5 10*3/uL — AB (ref 4.0–10.5)

## 2013-12-12 LAB — TSH: TSH: 4.58 u[IU]/mL — ABNORMAL HIGH (ref 0.350–4.500)

## 2013-12-12 LAB — I-STAT TROPONIN, ED: Troponin i, poc: 0.02 ng/mL (ref 0.00–0.08)

## 2013-12-12 LAB — CREATININE, SERUM
Creatinine, Ser: 1.35 mg/dL (ref 0.50–1.35)
GFR calc Af Amer: 56 mL/min — ABNORMAL LOW (ref >90)
GFR calc non Af Amer: 48 mL/min — ABNORMAL LOW (ref >90)

## 2013-12-12 LAB — TYPE AND SCREEN
ABO/RH(D): A POS
ANTIBODY SCREEN: NEGATIVE

## 2013-12-12 LAB — ABO/RH: ABO/RH(D): A POS

## 2013-12-12 LAB — CK: Total CK: 5621 U/L — ABNORMAL HIGH (ref 7–232)

## 2013-12-12 LAB — TROPONIN I: Troponin I: <0.3 ng/mL (ref <0.30)

## 2013-12-12 LAB — APTT: APTT: 30 s (ref 24–37)

## 2013-12-12 MED ORDER — CIPROFLOXACIN HCL 0.3 % OP SOLN
2.0000 [drp] | OPHTHALMIC | Status: DC
  Administered 2013-12-12 – 2013-12-15 (×14): 2 [drp] via OPHTHALMIC
  Filled 2013-12-12: qty 2.5

## 2013-12-12 MED ORDER — HEPARIN SODIUM (PORCINE) 5000 UNIT/ML IJ SOLN
5000.0000 [IU] | Freq: Three times a day (TID) | INTRAMUSCULAR | Status: DC
  Administered 2013-12-12 – 2013-12-15 (×9): 5000 [IU] via SUBCUTANEOUS
  Filled 2013-12-12 (×11): qty 1

## 2013-12-12 MED ORDER — SODIUM CHLORIDE 0.9 % IV SOLN
1000.0000 mL | INTRAVENOUS | Status: DC
  Administered 2013-12-12: 1000 mL via INTRAVENOUS

## 2013-12-12 MED ORDER — DEXTROSE 5 % IV SOLN
1.0000 g | INTRAVENOUS | Status: DC
  Administered 2013-12-12 – 2013-12-13 (×2): 1 g via INTRAVENOUS
  Filled 2013-12-12 (×3): qty 10

## 2013-12-12 MED ORDER — DONEPEZIL HCL 5 MG PO TABS
5.0000 mg | ORAL_TABLET | ORAL | Status: DC
  Administered 2013-12-13 – 2013-12-15 (×3): 5 mg via ORAL
  Filled 2013-12-12 (×4): qty 1

## 2013-12-12 MED ORDER — ACETAMINOPHEN 650 MG RE SUPP: 650.0000 mg | Freq: Four times a day (QID) | RECTAL | Status: DC | PRN

## 2013-12-12 MED ORDER — PNEUMOCOCCAL VAC POLYVALENT 25 MCG/0.5ML IJ INJ
0.5000 mL | INJECTION | INTRAMUSCULAR | Status: AC
  Administered 2013-12-15: 0.5 mL via INTRAMUSCULAR
  Filled 2013-12-12 (×2): qty 0.5

## 2013-12-12 MED ORDER — SIMVASTATIN 10 MG PO TABS
10.0000 mg | ORAL_TABLET | Freq: Every day | ORAL | Status: DC
  Administered 2013-12-12: 10 mg via ORAL
  Filled 2013-12-12 (×2): qty 1

## 2013-12-12 MED ORDER — SODIUM CHLORIDE 0.9 % IV SOLN
1000.0000 mL | Freq: Once | INTRAVENOUS | Status: AC
  Administered 2013-12-12: 1000 mL via INTRAVENOUS

## 2013-12-12 MED ORDER — SODIUM CHLORIDE 0.9 % IJ SOLN
3.0000 mL | Freq: Two times a day (BID) | INTRAMUSCULAR | Status: DC
  Administered 2013-12-12 – 2013-12-13 (×2): 3 mL via INTRAVENOUS

## 2013-12-12 MED ORDER — SODIUM CHLORIDE 0.9 % IV SOLN
INTRAVENOUS | Status: DC
  Administered 2013-12-13 – 2013-12-15 (×7): via INTRAVENOUS

## 2013-12-12 MED ORDER — MORPHINE SULFATE 4 MG/ML IJ SOLN
4.0000 mg | Freq: Once | INTRAMUSCULAR | Status: AC
  Administered 2013-12-12: 4 mg via INTRAVENOUS
  Filled 2013-12-12: qty 1

## 2013-12-12 MED ORDER — ACETAMINOPHEN 325 MG PO TABS
650.0000 mg | ORAL_TABLET | Freq: Four times a day (QID) | ORAL | Status: DC | PRN
  Administered 2013-12-13 – 2013-12-14 (×2): 650 mg via ORAL
  Filled 2013-12-12 (×2): qty 2

## 2013-12-12 NOTE — H&P (Signed)
[facility] Admission History and Physical Service Pager: [phone]  Patient name: [name] [name] Medical record number: [id number] Date of birth: [birth year] Age: 80 y.o. Gender: male  Primary Care Provider: Rosemarie Ax, MD Consultants: None  Code Status: Full   Chief Complaint: Fall   Assessment and Plan: [name] is a 80 y.o. male presenting with fall (possible syncope) found to have rhabdomyolysis . PMH is significant for CKD I-II, HLD, HTN, Prostate CA  #Rhabdomyolysis 2/2 fall, possible syncope?- Unsure of time frame that this happened.  From his description, sounds like orthostatic hypotension when going to the bathroom.   - Admit to telemetry, vitals per unit - UA c/w Rhabdo w/ large hemoglobin.  Does have component of elevated urobili, but normal TBili. -  Repeat CMET in AM and CBC and CK.  Will get UDS in case pt accidentally took something  - Consult to PT/OT, CSW for further evaluation and possible placement.  From initial evaluation, he will need long term care.  - Hold nephrotoxic agents in setting of AKI and rhabdo  - For syncopal possible, etiology most likely dehydration causing orthostatics.  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  Will place on telemetry, get TSH, cycle troponins (states he may have had chest pain but story discordant from ED and EMS report), will get Echo for valvular or CHF component.  CT head initial negative and no focal weakness on exam, but if has decline would go ahead and get MRI/MRA of head.  May warrant cardiology evaluation and possible holter monitor on d/c.   #Dehydration, AKI on CKD (stage I-II?) - Baseline creatinine around 1.0-1.1, on admission up to 1.55 with elevated BUN - No Hx of CHF, will increase fluids to 150 cc/hr and reassess CMET in AM   #UTI - Pt with LE and leukocytosis on CBC - Will tx with Rocephin and send for UCx   #Acute Conjunctivitis - Actively draining  purulent material, vision intact - Flouroquinolone eyedrops qid  # Leukocytosis - May be 2/2 to UTI or stress from being down for prolonged period but does have L shift as well.  Denies fever, chills, sweats, mucous production and CXR negative for acute infiltrate - Will send for BCx and low threshold to cover for his LUE wound with anaerobe coverage and Vanc  #Hx of HTN - Hold home HCTZ and Lisinopril in setting of AKI and rhabdo - Hydralazine PRN for elevated pressures   # L arm wound - Possible abrasion 2/2 to fall - Consult to wound care  - Monitor closely for infectious etiology, would start vanc if becomes febrile  - Repeat CBC in AM with diff   #Hyperlipidemia - Continue home zocor   #Prostate CA, remote hx per chart  FEN/GI: NS @ 150 cc/hr, Regular diet  Prophylaxis: SQ heparin   Disposition: Telemetry pending evaluation and improvement   History of Present Illness: [name] [name] is a 80 y.o. male presenting with fall (unknown duration).  Pt has dementia at baseline, unsure of how bad, and most of the information is provided by the daughter who is at baseline.  Briefly, pt's daughter states the pt lives by himself and is unsure what his ability is to perform is ADL's.  He has had slow steady decline on aricept for presumed Alzheimer's.  Per report and daughters report, pt had not been seen by his neighbors for some time, so they went to his house this AM to  check on him.  He was found in the bathroom on the floor with L arm abrasion and L head contusion and laceration and covered in some of his feces/urine.  They called 911 and pt was transported to the ED.  Pt states he may have had some chest pain during this event and he was getting up to use the bathroom this AM when he felt lightheaded and passed out, he did not take his medications to this point in the day.  His denies any fever, chills, sweats, productive cough, or previous lightheadedness or dizziness or palpitations.  Per  daughter, she believes he was down for longer than just this AM.   In the ED, pt had multiple evaluations performed including CBC showing leukocytosis to 17.5 w/ L shift, CMET showing ALT to 71, Creatinine 1.55 (baseline 1.0-1.1), UA showing large Hgb and moderate LE, CT head negative for acute bleed, CXR with some atelectasis.  He was started on 1 L NS bolus, Morphine x 1.     Denies smoking history, no alcohol use, and denies any drug use.     Review Of Systems: Per HPI  Patient Active Problem List   Diagnosis Date Noted  . Rhabdomyolysis 12/12/2013  . Anemia, macrocytic 10/20/2010  . DEMENTIA 10/11/2009  . PROSTATE CANCER 09/25/2006  . HYPERCHOLESTEROLEMIA 09/25/2006  . HYPERTENSION, BENIGN SYSTEMIC 09/25/2006  . CHRONIC KIDNEY DISEASE STAGE II (MILD) 09/25/2006  . OSTEOARTHRITIS, MULTI SITES 09/25/2006   Past Medical History: Past Medical History  Diagnosis Date  . Hypertension   . Cancer    Past Surgical History: History reviewed. No pertinent past surgical history. Social History: History  Substance Use Topics  . Smoking status: Never Smoker   . Smokeless tobacco: Not on file  . Alcohol Use: No   Family History: No family history on file. Allergies and Medications: No Known Allergies No current facility-administered medications on file prior to encounter.   Current Outpatient Prescriptions on File Prior to Encounter  Medication Sig Dispense Refill  . donepezil (ARICEPT) 5 MG tablet Take 1 tablet (5 mg total) by mouth every morning.  90 tablet  3  . hydrochlorothiazide (HYDRODIURIL) 25 MG tablet Take 1 tablet (25 mg total) by mouth daily.  90 tablet  3  . lisinopril (PRINIVIL,ZESTRIL) 40 MG tablet Take 1 tablet (40 mg total) by mouth daily.  90 tablet  3  . simvastatin (ZOCOR) 10 MG tablet Take 1 tablet (10 mg total) by mouth daily.  90 tablet  3    Objective: BP 157/83  Pulse 46  Temp(Src) 97.5 F (36.4 C) (Oral)  Resp 21  SpO2 100% Exam: General: NAD  comfortable in bed, no increased WOB HEENT: L supraorbital laceration about 2 cm in length (clotted), Dry mucous membranes, EOMI B/L, PERRLA, Normocephalic, + L eye purulent drainage and conjunctival injection.  + cataracts B/L Cardiovascular: RRR, no murmurs appreciated Respiratory: Decreased breath sounds bibasilar, no increased WOB,  Abdomen: Soft/NT/ND, NABS, no suprapubic tenderness or CVA tenderness Extremities: +2 pulses B/L LE, no edema B/L Skin: No sacral breakdown visible on gross exam.  + L arm abrasions with raw skin around the deltoid.  The rest of his arm is covered in kerlex, pulse + 2 radial, sensation and gross motor intact  Neuro: No focal deficits, Awake, alert, oriented to place and person but not time.   Labs and Imaging: CBC BMET   Recent Labs Lab 12/12/13 1347  WBC 17.5*  HGB 14.2  HCT 40.2  PLT  248   Lab Results  Component Value Date   CKTOTAL 5621* 12/12/2013   CKMB 2.1 09/30/2009   TROPONINI  Value: 0.02        NO INDICATION OF MYOCARDIAL INJURY. 09/30/2009     Recent Labs Lab 12/12/13 1347  NA 140  K 4.7  CL 101  CO2 21  BUN 42*  CREATININE 1.55*  GLUCOSE 132*  CALCIUM 9.4     CT Head - No acute process  EKG - LAFB, normal sinus rhythm  CXR - atelectasis   Nolon Rod, DO 12/12/2013, 4:16 PM PGY-2, [facility] pager: [phone], text pages welcome

## 2013-12-12 NOTE — ED Notes (Signed)
Patient transported to CT 

## 2013-12-12 NOTE — ED Notes (Addendum)
Per EMS- Pt coming from apartment where he lives alone. This morning neighbors went to check on him because he hadn't been seen in weeks. Heard to be locked in bathroom, calling for help. EMS arrived pt lying in floor with urine and feces lying on his left arm . Pt thinks he fell this morning, has blisters to left arm and swelling to left arm , abrasion to left upper shoulder. Also laceration and hematoma to left forehead. Pt is alert and answering questions appropriately. BP 164/94, CBG 134. Was given 2 mg morphine.

## 2013-12-12 NOTE — ED Provider Notes (Signed)
CSN: [id number]     Arrival date & time 12/12/13  1214 History   First MD Initiated Contact with Patient 12/12/13 1225     Chief Complaint  Patient presents with  . Fall   Patient is a 80 y.o. male presenting with fall.  Fall   Patient lives alone in his apartment. Neighbors went to check on him because they had not seen him a couple of weeks. EMS found the patient locked in his bathroom. He was calling out for help. Patient was found lying on the floor with urine and feces around him. EMS is concerned that he may have been like that for at least a day or 2. He was also lying on his left arm. Patient thinks he fell this morning. He does not think he was lying down overnight. Patient does not recall any injury to his arm. He does have swelling redness and significant pain in his left arm. Patient denies any head injury but he also appears to have a small hematoma left forehead. Patient denies any vomiting or diarrhea. He denies any trouble urinating. He denies any chest pain or shortness of breath. Past Medical History  Diagnosis Date  . Hypertension   . Cancer    History reviewed. No pertinent past surgical history. No family history on file. History  Substance Use Topics  . Smoking status: Never Smoker   . Smokeless tobacco: Not on file  . Alcohol Use: No    Review of Systems    Allergies  Review of patient's allergies indicates no known allergies.  Home Medications   Prior to Admission medications   Medication Sig Start Date End Date Taking? Authorizing Provider  acetaminophen (TYLENOL) 650 MG CR tablet Take 1 tablet (650 mg total) by mouth every 8 (eight) hours as needed for pain. 11/20/11   [place], DO  aspirin (ADULT ASPIRIN EC LOW STRENGTH) 81 MG EC tablet Take 81 mg by mouth daily.      Historical Provider, MD  cyanocobalamin (,VITAMIN B-12,) 1000 MCG/ML injection Inject 1 mL (1,000 mcg total) into the muscle every 30 (thirty) days. 04/17/11   [place], DO   donepezil (ARICEPT) 5 MG tablet Take 1 tablet (5 mg total) by mouth every morning. 09/10/13   Rosemarie Ax, MD  hydrochlorothiazide (HYDRODIURIL) 25 MG tablet Take 1 tablet (25 mg total) by mouth daily. 09/10/13   Rosemarie Ax, MD  lisinopril (PRINIVIL,ZESTRIL) 40 MG tablet Take 1 tablet (40 mg total) by mouth daily. 09/10/13   Rosemarie Ax, MD  Nutritional Supplements (ENSURE/FIBER) LIQD Take by mouth.      Historical Provider, MD  simvastatin (ZOCOR) 10 MG tablet Take 1 tablet (10 mg total) by mouth daily. 09/10/13   Rosemarie Ax, MD  traMADol (ULTRAM) 50 MG tablet Take 1 tablet (50 mg total) by mouth every 6 (six) hours as needed. 09/10/13   Rosemarie Ax, MD   BP 157/83  Pulse 46  Temp(Src) 97.5 F (36.4 C) (Oral)  Resp 21  SpO2 100% Physical Exam  Nursing note and vitals reviewed. Constitutional: No distress.  Frail, elderly  HENT:  Head: Normocephalic.  Right Ear: External ear normal.  Left Ear: External ear normal.  Small laceration left forehead  Eyes: Conjunctivae are normal. Right eye exhibits no discharge. Left eye exhibits no discharge. No scleral icterus.  Neck: Neck supple. No tracheal deviation present.  Cardiovascular: Normal rate, regular rhythm and intact distal pulses.  Pulmonary/Chest: Effort normal and breath sounds normal. No stridor. No respiratory distress. He has no wheezes. He has no rales.  Abdominal: Soft. Bowel sounds are normal. He exhibits no distension. There is no tenderness. There is no rebound and no guarding.  Genitourinary:  Sore to the underwear  Musculoskeletal: He exhibits edema and tenderness.  Left upper extremity with erythema and edema of the left upper arm and forearm, distal radial pulses are symmetrical and strong,  tenderness palpation diffusely from left shoulder down to hand  Neurological: He is alert. He has normal strength. He is not disoriented. No cranial nerve deficit (no facial droop, extraocular movements intact,  no slurred speech) or sensory deficit. He exhibits normal muscle tone. He displays no seizure activity. Coordination normal. GCS eye subscore is 4. GCS verbal subscore is 5. GCS motor subscore is 6.  Generalized weakness however patient is able to move both upper extremities and lower extremities  Skin: Skin is warm and dry. Rash noted. He is not diaphoretic.  Erythema and ulcerations of the left arm, ulcerative denuded skin areas of left upper arm, several blistering lesions in the lower arm with clear fluid within the blisters, negative nicholsky sign, a few blistered lesions appear to be in a linear pattern suggesting a liquid burn  Psychiatric: He has a normal mood and affect.    ED Course  Procedures (including critical care time)  CRITICAL CARE Performed by: Kathalene Frames Total critical care time: 35 Critical care time was exclusive of separately billable procedures and treating other patients. Critical care was necessary to treat or prevent imminent or life-threatening deterioration. Critical care was time spent personally by me on the following activities: development of treatment plan with patient and/or surrogate as well as nursing, discussions with consultants, evaluation of patient's response to treatment, examination of patient, obtaining history from patient or surrogate, ordering and performing treatments and interventions, ordering and review of laboratory studies, ordering and review of radiographic studies, pulse oximetry and re-evaluation of patient's condition.  Labs Review Labs Reviewed  CBC WITH DIFFERENTIAL - Abnormal; Notable for the following:    WBC 17.5 (*)    RBC 4.07 (*)    MCH 34.9 (*)    Neutrophils Relative % 88 (*)    Neutro Abs 15.5 (*)    Lymphocytes Relative 5 (*)    Monocytes Absolute 1.2 (*)    All other components within normal limits  COMPREHENSIVE METABOLIC PANEL - Abnormal; Notable for the following:    Glucose, Bld 132 (*)    BUN 42 (*)     Creatinine, Ser 1.55 (*)    AST 71 (*)    GFR calc non Af Amer 41 (*)    GFR calc Af Amer 47 (*)    All other components within normal limits  CK - Abnormal; Notable for the following:    Total CK 5621 (*)    All other components within normal limits  APTT  PROTIME-INR  URINALYSIS, ROUTINE W REFLEX MICROSCOPIC  I-STAT TROPOININ, ED  TYPE AND SCREEN  ABO/RH    Imaging Review Dg Chest 2 View  12/12/2013   CLINICAL DATA:  Fall  EXAM: CHEST - 2 VIEW  COMPARISON:  DG CHEST 2 VIEW dated 09/30/2009  FINDINGS: Normal heart size. Low lung volumes and bibasilar atelectasis. No pneumothorax. No obvious acute bony deformity.  IMPRESSION: Bibasilar atelectasis.   Electronically Signed   By: Maryclare Bean M.D.   On: 12/12/2013 13:16   Ct Head Wo  Contrast  12/12/2013   CLINICAL DATA:  Fall  EXAM: CT HEAD WITHOUT CONTRAST  TECHNIQUE: Contiguous axial images were obtained from the base of the skull through the vertex without intravenous contrast.  COMPARISON:  CT HEAD W/O CM dated 09/30/2009  FINDINGS: Dural calcifications along the falx are stable. Global atrophy and chronic ischemic changes are stable. No mass effect, midline shift, or acute intracranial hemorrhage. Soft tissue swelling over the left frontal bone is noted.  IMPRESSION: No acute intracranial pathology.  Chronic changes are noted.   Electronically Signed   By: Maryclare Bean M.D.   On: 12/12/2013 13:15     EKG Interpretation   Date/Time:  Sunday Dec 12 2013 12:21:55 EDT Ventricular Rate:  95 PR Interval:  160 QRS Duration: 103 QT Interval:  357 QTC Calculation: 449 R Axis:   -28 Text Interpretation:  Sinus rhythm Borderline left axis deviation Abnormal  R-wave progression, early transition Borderline T wave abnormalities  Baseline wander in lead(s) V3 Artifact Confirmed by Eunice Oldaker  MD-J, Emrie Gayle  ([id number]) on 12/12/2013 12:44:09 PM      MDM   Final diagnoses:  Dehydration  Acute renal failure  Rhabdomyolysis    Arm wound could be related  to edema and ulceration from lying on it for a prolonged period of time.  Pt does not recall a burn.  He has no other rash or mucosal findings to suggest TEN, Kathreen Cosier.  Will continue local wound care.  IV fluids for rhabdomyolysis.   Admit to telemetry.    Kathalene Frames, MD 12/12/13 [phone]

## 2013-12-12 NOTE — ED Notes (Signed)
MD at bedside. 

## 2013-12-13 ENCOUNTER — Inpatient Hospital Stay (HOSPITAL_COMMUNITY): Payer: Medicare Other

## 2013-12-13 DIAGNOSIS — I10 Essential (primary) hypertension: Secondary | ICD-10-CM

## 2013-12-13 DIAGNOSIS — N179 Acute kidney failure, unspecified: Principal | ICD-10-CM

## 2013-12-13 DIAGNOSIS — N182 Chronic kidney disease, stage 2 (mild): Secondary | ICD-10-CM

## 2013-12-13 DIAGNOSIS — E860 Dehydration: Secondary | ICD-10-CM

## 2013-12-13 DIAGNOSIS — M6282 Rhabdomyolysis: Secondary | ICD-10-CM

## 2013-12-13 DIAGNOSIS — E43 Unspecified severe protein-calorie malnutrition: Secondary | ICD-10-CM | POA: Diagnosis present

## 2013-12-13 DIAGNOSIS — I519 Heart disease, unspecified: Secondary | ICD-10-CM

## 2013-12-13 LAB — COMPREHENSIVE METABOLIC PANEL
ALK PHOS: 77 U/L (ref 39–117)
ALT: 22 U/L (ref 0–53)
AST: 79 U/L — ABNORMAL HIGH (ref 0–37)
Albumin: 2.8 g/dL — ABNORMAL LOW (ref 3.5–5.2)
BUN: 31 mg/dL — ABNORMAL HIGH (ref 6–23)
CO2: 20 meq/L (ref 19–32)
Calcium: 8.6 mg/dL (ref 8.4–10.5)
Chloride: 105 mEq/L (ref 96–112)
Creatinine, Ser: 1.14 mg/dL (ref 0.50–1.35)
GFR calc non Af Amer: 59 mL/min — ABNORMAL LOW (ref >90)
GFR, EST AFRICAN AMERICAN: 68 mL/min — AB (ref >90)
GLUCOSE: 129 mg/dL — AB (ref 70–99)
Potassium: 4.3 mEq/L (ref 3.7–5.3)
Sodium: 139 mEq/L (ref 137–147)
Total Bilirubin: 0.8 mg/dL (ref 0.3–1.2)
Total Protein: 6.3 g/dL (ref 6.0–8.3)

## 2013-12-13 LAB — CBC WITH DIFFERENTIAL/PLATELET
BASOS ABS: 0 10*3/uL (ref 0.0–0.1)
BASOS PCT: 0 % (ref 0–1)
EOS ABS: 0 10*3/uL (ref 0.0–0.7)
Eosinophils Relative: 0 % (ref 0–5)
HCT: 37.6 % — ABNORMAL LOW (ref 39.0–52.0)
Hemoglobin: 12.8 g/dL — ABNORMAL LOW (ref 13.0–17.0)
Lymphocytes Relative: 7 % — ABNORMAL LOW (ref 12–46)
Lymphs Abs: 0.7 10*3/uL (ref 0.7–4.0)
MCH: 34 pg (ref 26.0–34.0)
MCHC: 34 g/dL (ref 30.0–36.0)
MCV: 100 fL (ref 78.0–100.0)
MONO ABS: 1 10*3/uL (ref 0.1–1.0)
Monocytes Relative: 10 % (ref 3–12)
NEUTROS ABS: 8.7 10*3/uL — AB (ref 1.7–7.7)
Neutrophils Relative %: 83 % — ABNORMAL HIGH (ref 43–77)
Platelets: 227 10*3/uL (ref 150–400)
RBC: 3.76 MIL/uL — ABNORMAL LOW (ref 4.22–5.81)
RDW: 13.6 % (ref 11.5–15.5)
WBC: 10.4 10*3/uL (ref 4.0–10.5)

## 2013-12-13 LAB — URINE CULTURE
COLONY COUNT: NO GROWTH
CULTURE: NO GROWTH
Special Requests: NORMAL

## 2013-12-13 LAB — TROPONIN I
Troponin I: <0.3 ng/mL (ref <0.30)
Troponin I: <0.3 ng/mL (ref <0.30)

## 2013-12-13 LAB — VITAMIN B12: Vitamin B-12: 100 pg/mL — ABNORMAL LOW (ref 211–911)

## 2013-12-13 LAB — CK: CK TOTAL: 4562 U/L — AB (ref 7–232)

## 2013-12-13 MED ORDER — ENSURE COMPLETE PO LIQD
237.0000 mL | Freq: Three times a day (TID) | ORAL | Status: DC
  Administered 2013-12-13 – 2013-12-14 (×4): 237 mL via ORAL

## 2013-12-13 NOTE — Progress Notes (Signed)
Family Medicine Teaching Service Daily Progress Note Intern Pager: [phone]  Patient name: [name] [name] Medical record number: [id number] Date of birth: 1932/04/26 Age: 80 y.o. Gender: male  Primary Care Provider: Rosemarie Ax, MD Consultants: None  Code Status: Full  Assessment and Plan:  [name] [name] is a 80 y.o. male presenting with fall (possible syncope) found to have rhabdomyolysis . PMH is significant for CKD I-II, HLD, HTN, Prostate CA   #Rhabdomyolysis 2/2 fall, possible syncope?- Unsure of time frame that this happened or how long down. From his description, sounds like orthostatic hypotension when going to the bathroom. CK elevated to 5621. Creatinine elevated to 1.55 (baseline 1-1.1) - continue telemetry and vital monitoring. Telemetry strip showing sinus rhythm and no sign of arrythmia - continue trending CK, creatinine and CBC. Today, CK down to 4862, creatinine 1.14 - B-12 and Folate pending  - PT/OT consulted with recommendation of SNF placement and 24-hr supervision - CSW evaluation pending - Hold nephrotoxic agents in setting of AKI and rhabdo  - For syncopal possible, etiology most likely dehydration causing orthostatics. 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 Patient placed on telemetry and had has normal sinus rhythm. TSH slighted elevated to 4.580, troponins are negative x3.  Echo for valvular or CHF component pending. CT head initial negative and no focal weakness on exam, but if has decline would go ahead and get MRI/MRA of head. May warrant cardiology evaluation and possible holter monitor on d/c.  - MMSE to evaluate for cognitive dysfunction  #Dehydration, AKI on CKD (stage I-II?) Baseline creatinine around 1.0-1.1, on admission up to 1.55 with elevated BUN. Today, creatinine is 1.14 - continue IV fluids and monitoring of CMET  #UTI, Pt with LE and leukocytosis on CBC  - continue tx with Rocephin - UCx pending  #Acute  Conjunctivitis, actively draining, vision intact  - continue Flouroquinolone eyedrops qid   # Leukocytosis,  may be 2/2 to UTI or stress from being down for prolonged period but does have L shift as well. Denies fever, chills, sweats, mucous production and CXR negative for acute infiltrate  - cultures still pending - continue trending CBC - maintain low threshold for treatment of his LUE wound with anaerobe coverage and Vanc   #Hx of HTN  - Hold home HCTZ and Lisinopril in setting of AKI and rhabdo  - Hydralazine PRN for elevated pressures   # L arm wound - Possible abrasion 2/2 to fall, significant hand edema but neurovascularly intact - wound care consulted, recommended M/W/F dressing changes using Vaseline gauze - Monitor closely for infectious etiology, would start vanc if becomes febrile  - venous doppler to r/o DVT due to edema - xray LUE (shoulder)  #Hyperlipidemia  - hold home zocor in setting of rhabdo  #Prostate CA, remote hx per chart   FEN/GI: NS @ 150 cc/hr, Regular diet  Prophylaxis: SQ heparin   Disposition: Telemetry pending evaluation and improvement, possible d/c to SNF   Subjective:  No acute events overnight. Patient endorses pain in LUE.    Objective: Temp:  [97.5 F (36.4 C)-98.1 F (36.7 C)] 97.5 F (36.4 C) (05/18 1401) Pulse Rate:  [76-104] 80 (05/18 1401) Resp:  [18-20] 18 (05/18 1401) BP: (124-151)/(55-103) 124/55 mmHg (05/18 1401) SpO2:  [95 %-99 %] 96 % (05/18 1401) Weight:  [159 lb 6.4 oz (72.303 kg)] 159 lb 6.4 oz (72.303 kg) (05/18 0500)  Physical Exam: General: NAD, resting comfortably in bed HEENT: NCAT, EOMI B/L,  PERRL, clear drainage from right eye Cardiovascular: RRR, no m/g/r  Respiratory: no increased WOB, CTAB Abdomen:soft, non-tender, not distended, +BS Extremities: 2+ pulses throughout. left hand edema but patient able to squeeze hand. Limited LUE mobility. Patient able to bend arm at elbow but no shoulder movement. Abrasion  on left shoulder with skin lesion extending down arm to mid-forearm. Blisters and skin tear present. Neuro: Awake, alert and oriented x3. No focal deficits  Laboratory:  Recent Labs Lab 12/12/13 1347 12/13/13 0457  WBC 17.5* 10.4  HGB 14.2 12.8*  HCT 40.2 37.6*  PLT 248 227    Recent Labs Lab 12/12/13 1347 12/12/13 1940 12/13/13 0457  NA 140  --  139  K 4.7  --  4.3  CL 101  --  105  CO2 21  --  20  BUN 42*  --  31*  CREATININE 1.55* 1.35 1.14  CALCIUM 9.4  --  8.6  PROT 7.6  --  6.3  BILITOT 1.0  --  0.8  ALKPHOS 93  --  77  ALT 20  --  22  AST 71*  --  79*  GLUCOSE 132*  --  129*      Milele Bynum, Med Student 12/13/2013, 4:20 PM  Upper Level Addendum:  I have seen and evaluated this patient along with [facility] and reviewed the above note.  BP 124/55  Pulse 80  Temp(Src) 97.5 F (36.4 C) (Oral)  Resp 18  Ht 5\' 11"  (1.803 m)  Wt 159 lb 6.4 oz (72.303 kg)  BMI 22.24 kg/m2  SpO2 96%  NAD, lying in bed Heart: RRR, no murmurs Lungs: CTAB, NWOB Abd: soft, nontender to palpation Ext: LUE with blistering, erythema, and edema. Pictures to follow in tomorrow's note. No obvious bacterial superinfection Neuro: grossly nonfocal, speech normal  A/P -Fall/syncope: await echo -Rhabdo: CK downtrending, creatinine improving. Continue IVF. Hold home statin. -LUE swelling: appearance consistent with burn. Wound care consult. Check LUE doppler. Also xray L shoulder given severely limited ROM due to pain. -UTI: await urine & blood cx, continue rocephin -Conjunctivitis: continue cipro drops QID -Dementia: will perform MMSE when able to assess mental status. Check B12/folate especially in setting of macrocytic anemia. -Social: await PT/OT evals, social work consult  Chrisandra Netters, MD Family Medicine PGY-2

## 2013-12-13 NOTE — Evaluation (Signed)
Occupational Therapy Evaluation Patient Details Name: [name] [name] MRN: [id number] DOB: 05/08/1932 Today's Date: 12/13/2013    History of Present Illness Pt admitted after fall at home unable to get up for unknown period of time on LUE with rhabdomyolysis, UTI, conjunctivitis, wounds and abrasions to LUE   Clinical Impression   Pt, reportedly, could function at home alone prior to admission, although his daughter suspects in was a tenuous situation. Pt currently self feeds and perform some grooming at bed level using his non dominant R hand as his L UE has is painful and has blisters and open wounds. L shoulder xray is pending. Pt requires total assist for all mobility and is unable to sit unsupported.  He will need SNF level care for further therapy. Will follow acutely     Follow Up Recommendations  SNF;Supervision/Assistance - 24 hour    Equipment Recommendations       Recommendations for Other Services       Precautions / Restrictions Precautions Precautions: Fall Restrictions Weight Bearing Restrictions: No Other Position/Activity Restrictions: L shoulder is awaiting xray 12/13/13      Mobility Bed Mobility Overal bed mobility: Needs Assistance Bed Mobility: Supine to Sit;Sit to Supine     Supine to sit: Total assist;HOB elevated Sit to supine: Total assist   General bed mobility comments: pt with fixed kyphosis, unable to lay flat, required pillow and blanket roll in supine for comfort  Transfers                 General transfer comment: unable to stand for transfer    Balance Overall balance assessment: Needs assistance;History of Falls   Sitting balance-Leahy Scale: Zero Sitting balance - Comments: posterior lean with inability to achieve even 70degree bil hip flexion for upright sitting                                    ADL Overall ADL's : Needs assistance/impaired Eating/Feeding: Minimal assistance;Bed level (with R  hand) Eating/Feeding Details (indicate cue type and reason): needs assist for set up and to position him optimally in bed Grooming: Wash/dry face;Wash/dry hands;Bed level;Minimal assistance (pt does not have teeth)   Upper Body Bathing: Total assistance;Bed level   Lower Body Bathing: Total assistance;Bed level   Upper Body Dressing : Maximal assistance;Bed level   Lower Body Dressing: Total assistance;Sit to/from stand               Functional mobility during ADLs: +2 for physical assistance;Cueing for sequencing (bed mobility) General ADL Comments: Pt is reliant on his non dominant R hand due to pain and wounds on L UE.     Vision                     Perception     Praxis      Pertinent Vitals/Pain L UE described at soreness when moved, repositioned, elevated, VSS     Hand Dominance Left   Extremity/Trunk Assessment Upper Extremity Assessment Upper Extremity Assessment: LUE deficits/detail LUE Deficits / Details: painful shoulder to wrist, moderate edema with multiple blisters and wounds LUE: Unable to fully assess due to pain LUE Coordination: decreased fine motor;decreased gross motor   Lower Extremity Assessment Lower Extremity Assessment: Defer to PT evaluation RLE Deficits / Details: knee flexion with limited ROM with flexion 15-40degrees grossly unable to achieve further extension and hip flexion limited to grossly 20degrees with  no further flexion or extension RLE Coordination: decreased gross motor LLE Deficits / Details: able to achieve neutral extension but flexion at knee limited to grossly 60degrees and hip flexion grossly 45degrees. unable to significantly abduct either hip LLE Coordination: decreased gross motor   Cervical / Trunk Assessment Cervical / Trunk Assessment: Kyphotic   Communication Communication Communication: HOH   Cognition Arousal/Alertness: Awake/alert Behavior During Therapy: Flat affect Overall Cognitive Status:  Impaired/Different from baseline Area of Impairment: Problem solving;Awareness;Safety/judgement     Memory: Decreased short-term memory   Safety/Judgement: Decreased awareness of safety;Decreased awareness of deficits   Problem Solving: Slow processing;Difficulty sequencing General Comments: daughter reports pt was reluctant to accept assist at home and often missed MD appoints due to lack of transportation.   General Comments       Exercises  Instructed in elevation to decrease edema and hand AROM     Shoulder Instructions      Home Living Family/patient expects to be discharged to:: Private residence Living Arrangements: Alone Available Help at Discharge: Family;Available PRN/intermittently Type of Home: Apartment Home Access: Level entry     Home Layout: One level     Bathroom Shower/Tub: Tub/shower unit;Walk-in shower   Bathroom Toilet: Standard     Home Equipment: Walker - 2 wheels          Prior Functioning/Environment Level of Independence: Independent with assistive device(s)        Comments: pt states he was walking with his walker, cooking some, receiving meals on wheels and bathing the best he could    OT Diagnosis: Generalized weakness;Cognitive deficits;Acute pain   OT Problem List: Decreased strength;Decreased range of motion;Decreased activity tolerance;Impaired balance (sitting and/or standing);Decreased coordination;Decreased cognition;Decreased safety awareness;Decreased knowledge of use of DME or AE;Impaired UE functional use;Pain;Increased edema   OT Treatment/Interventions: Self-care/ADL training;Therapeutic exercise;Therapeutic activities;Patient/family education;Balance training    OT Goals(Current goals can be found in the care plan section) Acute Rehab OT Goals Patient Stated Goal: be able to walk OT Goal Formulation: With patient Time For Goal Achievement: 12/27/13 Potential to Achieve Goals: Good ADL Goals Pt Will Perform Eating:  with set-up;sitting;bed level (with R hand) Pt Will Perform Grooming: with set-up;sitting;bed level Pt/caregiver will Perform Home Exercise Program: Left upper extremity;Increased ROM;Independently (elbow to hand A/AAROM, shoulder pending xray) Additional ADL Goal #1: Pt will roll with min assist for pressure relief, bathing, bedpan. Additional ADL Goal #2: Pt will sit EOB x 5 minutes with min guard assist.  OT Frequency: Min 2X/week   Barriers to D/C: Decreased caregiver support          Co-evaluation              End of Session Nurse Communication:  (assisted with dressing change, ok to give Sprite)  Activity Tolerance: Patient limited by pain Patient left: in bed;with call bell/phone within reach;with bed alarm set;with family/visitor present   Time: 1141-1240 OT Time Calculation (min): 59 min Charges:  OT General Charges $OT Visit: 1 Procedure OT Evaluation $Initial OT Evaluation Tier I: 1 Procedure OT Treatments $Self Care/Home Management : 8-22 mins $Therapeutic Activity: 8-22 mins G-Codes:    Haze Boyden Rabon Scholle Dec 27, 2013, 1:14 PM [phone]

## 2013-12-13 NOTE — Progress Notes (Signed)
  Echocardiogram 2D Echocardiogram has been performed.  Donata Clay 12/13/2013, 3:15 PM

## 2013-12-13 NOTE — Clinical Social Work Placement (Addendum)
Clinical Social Work Department  CLINICAL SOCIAL WORK PLACEMENT NOTE    Patient:[name] [name]  Account Number: [id number]  Admit date: 12/12/13 Clinical Social Worker: Rhea Pink LCSWA Date/time: 12/13/2013 11:30 AM  Clinical Social Work is seeking post-discharge placement for this patient at the following level of care: SKILLED NURSING (*CSW will update this form in Epic as items are completed)  12/13/2013 Patient/family provided with [facility] Department of Clinical Social Work's list of facilities offering this level of care within the geographic area requested by the patient (or if unable, by the patient's family).  12/13/2013 Patient/family informed of their freedom to choose among providers that offer the needed level of care, that participate in Medicare, Medicaid or managed care program needed by the patient, have an available bed and are willing to accept the patient.  12/13/2013 Patient/family informed of [facility]' ownership interest in [facility], as well as of the fact that they are under no obligation to receive care at this facility.  PASARR submitted to EDS on  PASARR number received from [place] on  FL2 transmitted to all facilities in geographic area requested by pt/family on 12/13/2013 FL2 transmitted to all facilities within larger geographic area on  Patient informed that his/her managed care company has contracts with or will negotiate with certain facilities, including the following:  Patient/family informed of bed offers received:   Patient chooses bed at [facility] Physician recommends and patient chooses bed at  Patient to be transferred to on 12/15/13 Patient to be transferred to facility by  The following physician request were entered in Epic:  Additional Comments:

## 2013-12-13 NOTE — Progress Notes (Signed)
Patient repositioned with much resistance due to discomfort, stiffness, and "arthritis" according to patient, foam dressing removed left trochanter to assess, appears as a bruise and partial skin tear but area is hard to touch, will have [place] RN to reassess on Tuesday, Hazle Nordmann RN

## 2013-12-13 NOTE — Progress Notes (Signed)
Chaplain responded to spiritual consult. Pt with his daughter, preparing to eat lunch. Pt did not seem to remember asking to speak with chaplain and seemed slightly confused. Chaplain available if pt's needs change.

## 2013-12-13 NOTE — Progress Notes (Signed)
Left arm redressed with vaseline gauze and kerlix due to seeping clear liquid, left upper arm with several open areas skin tears with missing skin both anterior and posterior, several intact blistered area to left lower extremity protected with vaseline gauze as well and kerlix, elevated LLE on two pillows to decrease swelling, with discomfort to LUE when moving LUE, Hazle Nordmann RN

## 2013-12-13 NOTE — Evaluation (Signed)
Physical Therapy Evaluation Patient Details Name: [name] [name] MRN: [id number] DOB: Apr 24, 1932 Today's Date: 12/13/2013   History of Present Illness  Pt admitted after fall at home unable to get up for unknown period of time on LUE with rhabdomyolysis, UTI, conjunctivitis, wounds and abrasions to LUE  Clinical Impression  Pt very pleasant and stating he was caring for himself other than meals on wheels. Pt very stiff unable to flex bil hips, extend trunk, achieve sitting, assist with rolling or sitting. Pt severely debilitated from what he and dgtr report as baseline and he will need SNF for DC with pt and dgtr aware and agreeable. Will follow acutely to attempt to increase ROM, strength and mobility to decrease burden of care.     Follow Up Recommendations SNF;Supervision/Assistance - 24 hour    Equipment Recommendations       Recommendations for Other Services       Precautions / Restrictions Precautions Precautions: Fall Restrictions Weight Bearing Restrictions: No      Mobility  Bed Mobility Overal bed mobility: Needs Assistance Bed Mobility: Supine to Sit;Sit to Supine     Supine to sit: Total assist;HOB elevated Sit to supine: Total assist   General bed mobility comments: with assist to pivot legs to EOB able to clear lower body but pt unable to assist with bil UE to reach for rail or perform anterior translation of the trunk. Pt very kyphotic and even supine with 2 pillows head not reaching pillows. With transfer to sitting assisted with anterior translation from behind but unable to achieve upright due to posterior lean and limited hip flexion bilaterally. Total assist to pivot legs back into bed and pull pt to [facility]  Transfers                    Ambulation/Gait                Stairs            Wheelchair Mobility    Modified Rankin (Stroke Patients Only)       Balance Overall balance assessment: Needs assistance;History of Falls    Sitting balance-Leahy Scale: Zero Sitting balance - Comments: posterior lean with inability to achieve even 70degree bil hip flexion for upright sitting                                     Pertinent Vitals/Pain No pain    Home Living Family/patient expects to be discharged to:: Private residence Living Arrangements: Alone Available Help at Discharge: Family;Available PRN/intermittently Type of Home: Apartment Home Access: Level entry     Home Layout: One level Home Equipment: Walker - 2 wheels      Prior Function Level of Independence: Independent with assistive device(s)         Comments: pt states he was walking with his walker, cooking some, receiving meals on wheels and bathing the best he could     Hand Dominance        Extremity/Trunk Assessment   Upper Extremity Assessment: Defer to OT evaluation           Lower Extremity Assessment: RLE deficits/detail;LLE deficits/detail RLE Deficits / Details: knee flexion with limited ROM with flexion 15-40degrees grossly unable to achieve further extension and hip flexion limited to grossly 20degrees with no further flexion or extension LLE Deficits / Details: able to achieve neutral extension but flexion at knee  limited to grossly 60degrees and hip flexion grossly 45degrees. unable to significantly abduct either hip  Cervical / Trunk Assessment: Kyphotic  Communication   Communication: No difficulties  Cognition Arousal/Alertness: Awake/alert Behavior During Therapy: Flat affect Overall Cognitive Status: Impaired/Different from baseline Area of Impairment: Problem solving;Awareness;Safety/judgement     Memory: Decreased short-term memory   Safety/Judgement: Decreased awareness of safety;Decreased awareness of deficits   Problem Solving: Slow processing;Difficulty sequencing      General Comments      Exercises        Assessment/Plan    PT Assessment Patient needs continued PT  services  PT Diagnosis Altered mental status;Generalized weakness   PT Problem List Decreased activity tolerance;Decreased strength;Decreased mobility;Decreased cognition;Decreased safety awareness;Decreased range of motion;Decreased balance;Decreased coordination;Decreased skin integrity  PT Treatment Interventions Therapeutic activities;Therapeutic exercise;Functional mobility training;Patient/family education   PT Goals (Current goals can be found in the Care Plan section) Acute Rehab PT Goals Patient Stated Goal: be able to walk PT Goal Formulation: With patient Time For Goal Achievement: 12/27/13 Potential to Achieve Goals: Poor    Frequency Min 2X/week   Barriers to discharge Decreased caregiver support      Co-evaluation               End of Session     Patient left: in bed;with call bell/phone within reach;with bed alarm set Nurse Communication: Mobility status;Need for lift equipment         Time: 7026-3785 PT Time Calculation (min): 17 min   Charges:   PT Evaluation $Initial PT Evaluation Tier I: 1 Procedure PT Treatments $Therapeutic Activity: 8-22 mins   PT G Codes:          Laveta Gilkey B Azelia Reiger 12/13/2013, 11:53 AM  Elwyn Reach, [place]

## 2013-12-13 NOTE — Progress Notes (Signed)
Clinical Social Work Department  BRIEF PSYCHOSOCIAL ASSESSMENT  Patient: [name] [name]  Account Number: [id number]   Admit date: 12/12/12 Clinical Social Worker Rhea Pink, MSW Date/Time: 12/13/2013 2:30 PM Referred by: Physician Date Referred:  Referred for   SNF Placement   Other Referral:  Interview type: Patient and patient's daughter at bedside  Other interview type: PSYCHOSOCIAL DATA  Living Status: Family Admitted from facility:  Level of care:  Primary support name: Derinda Late Primary support relationship to patient: DAughter Degree of support available:  Strong and vested  CURRENT CONCERNS  Current Concerns   Post-Acute Placement   Other Concerns:  SOCIAL WORK ASSESSMENT / PLAN  CSW met with pt and patient's daughter at bedside to offer support and discuss SNF placement. Both patient and daughter are agreeable to ST-SNF placement. Both expressed appreciation for CSW assist re: PT recommendation for SNF.   Pt lives with daughter  CSW explained placement process and answered questions.   Pt reports no preference at this time  CSW completed FL2 and initiated SNF search.     Assessment/plan status: Information/Referral to [employer]  Other assessment/ plan:  Information/referral to community resources:  SNF   PTAR  PATIENT'S/FAMILY'S RESPONSE TO PLAN OF CARE:  Pt  reports he is agreeable to ST SNF in order to increase strength and independence with mobility prior to returning home  Pt verbalized understanding of placement process and appreciation for CSW assist.   Rhea Pink, MSW, [place]

## 2013-12-13 NOTE — Consult Note (Signed)
WOC requested for skin tear left upper arm, discussed with bedside nurse who has implemented appropriate skin care order set for skin tear.  Skin flap is missing therefore the use of only the Vaseline gauze will suffice. Change M/W/F.   Left trochanter and left shoulder pending update from the nursing staff.   Discussed POC with bedside nurse.  Re consult if needed, will not follow at this time. Thanks  Dorean Hiebert Kellogg, [place] [phone])

## 2013-12-13 NOTE — Progress Notes (Signed)
INITIAL NUTRITION ASSESSMENT  DOCUMENTATION CODES Per approved criteria  -Severe malnutrition in the context of acute illness or injury   INTERVENTION:  Ensure Complete PO TID, each supplement provides 350 kcal and 13 grams of protein  NUTRITION DIAGNOSIS: Malnutrition related to inadequate oral intake as evidenced by moderate depletion of muscle mass and subcutaneous fat mass.   Goal: Intake to meet >90% of estimated nutrition needs.  Monitor:  PO intake, labs, weight trend.  Reason for Assessment: MD Consult  80 y.o. male  Admitting Dx: Rhabdomyolysis  ASSESSMENT: Patient is a 80 y.o. male presenting with fall (possible syncope) found to have rhabdomyolysis. PMH is significant for CKD I-II, HLD, HTN, Prostate CA.  Patient and his daughter report that patient has been eating poorly for the past few months since his wife passed away. He lives at home alone. C/o poor appetite. Agrees to try Ensure supplements to maximize oral intake.  Nutrition Focused Physical Exam:  Subcutaneous Fat:  Orbital Region: moderate depletion Upper Arm Region: moderate depletion Thoracic and Lumbar Region: NA  Muscle:  Temple Region: moderate-severe depletion Clavicle Bone Region: moderate depletion Clavicle and Acromion Bone Region: moderate depletion Scapular Bone Region: NA Dorsal Hand: mild depletion Patellar Region: mild depletion Anterior Thigh Region: moderate depletion Posterior Calf Region: mild depletion  Edema: left hand +2 edema; none LE  Pt meets criteria for severe MALNUTRITION in the context of acute illness as evidenced by moderate depletion of muscle and subcutaneous fat mass.  Height: Ht Readings from Last 1 Encounters:  12/12/13 5\' 11"  (1.803 m)    Weight: Wt Readings from Last 1 Encounters:  12/13/13 159 lb 6.4 oz (72.303 kg)    Ideal Body Weight: 78.2 kg  % Ideal Body Weight: 92%  Wt Readings from Last 10 Encounters:  12/13/13 159 lb 6.4 oz (72.303 kg)   11/20/11 170 lb (77.111 kg)  06/11/11 165 lb (74.844 kg)  03/05/11 159 lb (72.122 kg)  10/19/10 161 lb (73.029 kg)  08/03/10 152 lb 8 oz (69.174 kg)  07/18/10 155 lb 6.4 oz (70.489 kg)  04/10/10 149 lb (67.586 kg)  01/12/10 149 lb 12.8 oz (67.949 kg)  01/04/10 143 lb (64.864 kg)    Usual Body Weight: ~170 lb per daughter in February  % Usual Body Weight: 94%  BMI:  Body mass index is 22.24 kg/(m^2).  Estimated Nutritional Needs: Kcal: 1850-2050 Protein: 85-100 gm Fluid: 2 L  Skin: left head laceration  Diet Order: General  EDUCATION NEEDS: -Education needs addressed   Intake/Output Summary (Last 24 hours) at 12/13/13 1338 Last data filed at 12/13/13 0854  Gross per 24 hour  Intake   1960 ml  Output    350 ml  Net   1610 ml    Last BM: 5/17   Labs:   Recent Labs Lab 12/12/13 1347 12/12/13 1940 12/13/13 0457  NA 140  --  139  K 4.7  --  4.3  CL 101  --  105  CO2 21  --  20  BUN 42*  --  31*  CREATININE 1.55* 1.35 1.14  CALCIUM 9.4  --  8.6  GLUCOSE 132*  --  129*    CBG (last 3)  No results found for this basename: GLUCAP,  in the last 72 hours  Scheduled Meds: . cefTRIAXone (ROCEPHIN)  IV  1 g Intravenous Q24H  . ciprofloxacin  2 drop Both Eyes Q4H while awake  . donepezil  5 mg Oral BH-q7a  . heparin  5,000 Units Subcutaneous 3 times per day  . pneumococcal 23 valent vaccine  0.5 mL Intramuscular Tomorrow-1000  . sodium chloride  3 mL Intravenous Q12H    Continuous Infusions: . sodium chloride 150 mL/hr at 12/13/13 1962    Past Medical History  Diagnosis Date  . Hypertension   . Cancer     History reviewed. No pertinent past surgical history.   Molli Barrows, RD, LDN, [place] Pager [phone] After Hours Pager [phone]

## 2013-12-13 NOTE — H&P (Signed)
FMTS Attending Admission Note: Annabell Sabal MD Personal pager:  [phone] FPTS Service Pager:  [phone]  I  have seen and examined this patient, reviewed their chart. I have discussed this patient with the resident. I agree with the resident's findings, assessment and care plan.  Additionally:  80 yo M with PMH significant for HTN, question of prostate CA with normal PSA in 2012, HTN who presents with fall and being down for unknown duration.  Patient remembers falling, denies hitting head.  States "scraped my left arm."  Cannot remember when he fell.  Found by daughter and brought to ED, found to be in rhabdo.  Admitted to [place].  On exam this Am, states he feel "much better" than before.  Denies burning his arm, states it was scraped in fall.  Heart RRR.  Lungs clear, abdomen benign.  Left arm in bandage with sterile gauze from shoulder to Left wrist.  Left hand with +2 pitting edema which does NOT extend to forearm, good sensation, strength 5/5, pulses +2 in that hand.  Right hand WNL.  I did not unwrap his arm.   Imp/Plan:  1. Fall: - unclear etiology.  Negative CT head.   - with Rhabdo, which is improving.  Continue fluids/rehydration.  Kidney's improved today.  - Agree with PT/OT/SW - Agree with syncope wu  2.  Left arm wound: - concern for burn based on appearnace and secondary to swelling.  Possible superinfection as well.  I did not personally look at his arm but am concerned by the degree of swelling in his hand.  Not likely compartment syndrome due to being neurovascularly intact and without pain in hand. - Agree with wound consult.   - Watch for clearing of swelling in hand.     Alveda Reasons, MD 12/13/2013 8:24 AM

## 2013-12-14 ENCOUNTER — Inpatient Hospital Stay (HOSPITAL_COMMUNITY): Payer: Medicare Other

## 2013-12-14 DIAGNOSIS — I998 Other disorder of circulatory system: Secondary | ICD-10-CM

## 2013-12-14 DIAGNOSIS — S51809A Unspecified open wound of unspecified forearm, initial encounter: Secondary | ICD-10-CM

## 2013-12-14 DIAGNOSIS — E538 Deficiency of other specified B group vitamins: Secondary | ICD-10-CM | POA: Diagnosis present

## 2013-12-14 LAB — CBC WITH DIFFERENTIAL/PLATELET
BASOS ABS: 0 10*3/uL (ref 0.0–0.1)
Basophils Relative: 0 % (ref 0–1)
Eosinophils Absolute: 0.1 10*3/uL (ref 0.0–0.7)
Eosinophils Relative: 1 % (ref 0–5)
HEMATOCRIT: 37.1 % — AB (ref 39.0–52.0)
Hemoglobin: 12.4 g/dL — ABNORMAL LOW (ref 13.0–17.0)
LYMPHS PCT: 9 % — AB (ref 12–46)
Lymphs Abs: 0.8 10*3/uL (ref 0.7–4.0)
MCH: 33.8 pg (ref 26.0–34.0)
MCHC: 33.4 g/dL (ref 30.0–36.0)
MCV: 101.1 fL — ABNORMAL HIGH (ref 78.0–100.0)
Monocytes Absolute: 0.7 10*3/uL (ref 0.1–1.0)
Monocytes Relative: 8 % (ref 3–12)
NEUTROS ABS: 7 10*3/uL (ref 1.7–7.7)
Neutrophils Relative %: 82 % — ABNORMAL HIGH (ref 43–77)
Platelets: 214 10*3/uL (ref 150–400)
RBC: 3.67 MIL/uL — ABNORMAL LOW (ref 4.22–5.81)
RDW: 13.5 % (ref 11.5–15.5)
WBC: 8.6 10*3/uL (ref 4.0–10.5)

## 2013-12-14 LAB — COMPREHENSIVE METABOLIC PANEL
ALT: 24 U/L (ref 0–53)
AST: 66 U/L — ABNORMAL HIGH (ref 0–37)
Albumin: 2.5 g/dL — ABNORMAL LOW (ref 3.5–5.2)
Alkaline Phosphatase: 70 U/L (ref 39–117)
BUN: 18 mg/dL (ref 6–23)
CO2: 21 mEq/L (ref 19–32)
Calcium: 8.5 mg/dL (ref 8.4–10.5)
Chloride: 108 mEq/L (ref 96–112)
Creatinine, Ser: 0.88 mg/dL (ref 0.50–1.35)
GFR calc non Af Amer: 79 mL/min — ABNORMAL LOW (ref >90)
Glucose, Bld: 134 mg/dL — ABNORMAL HIGH (ref 70–99)
Potassium: 4.3 mEq/L (ref 3.7–5.3)
Sodium: 140 mEq/L (ref 137–147)
TOTAL PROTEIN: 5.9 g/dL — AB (ref 6.0–8.3)
Total Bilirubin: 0.5 mg/dL (ref 0.3–1.2)

## 2013-12-14 LAB — FOLATE RBC: RBC Folate: 339 ng/mL (ref >280)

## 2013-12-14 MED ORDER — ONDANSETRON HCL 4 MG/2ML IJ SOLN: 4.0000 mg | Freq: Four times a day (QID) | INTRAMUSCULAR | Status: DC | PRN

## 2013-12-14 MED ORDER — ONDANSETRON 8 MG/NS 50 ML IVPB: 8.0000 mg | Freq: Four times a day (QID) | INTRAVENOUS | Status: DC | PRN

## 2013-12-14 MED ORDER — VANCOMYCIN HCL IN DEXTROSE 1-5 GM/200ML-% IV SOLN
1000.0000 mg | Freq: Two times a day (BID) | INTRAVENOUS | Status: DC
  Administered 2013-12-14 (×2): 1000 mg via INTRAVENOUS
  Filled 2013-12-14 (×3): qty 200

## 2013-12-14 MED ORDER — PIPERACILLIN-TAZOBACTAM 3.375 G IVPB
3.3750 g | Freq: Three times a day (TID) | INTRAVENOUS | Status: DC
  Administered 2013-12-14 – 2013-12-15 (×3): 3.375 g via INTRAVENOUS
  Filled 2013-12-14 (×4): qty 50

## 2013-12-14 MED ORDER — SILVER SULFADIAZINE 1 % EX CREA
TOPICAL_CREAM | Freq: Two times a day (BID) | CUTANEOUS | Status: DC
  Administered 2013-12-14 – 2013-12-15 (×2): via TOPICAL
  Filled 2013-12-14: qty 85

## 2013-12-14 MED ORDER — VITAMIN B-12 1000 MCG PO TABS
1000.0000 ug | ORAL_TABLET | Freq: Every day | ORAL | Status: DC
  Administered 2013-12-14 – 2013-12-15 (×2): 1000 ug via ORAL
  Filled 2013-12-14 (×2): qty 1

## 2013-12-14 NOTE — Progress Notes (Signed)
Family Medicine Teaching Service 2Daily Progress Note Intern Pager: [phone]  Patient name: [name] [name] Medical record number: [id number] Date of birth: May 06, 1932 Age: 80 y.o. Gender: male  Primary Care Provider: Rosemarie Ax, MD Consultants: Surgery Code Status: full  Assessment and Plan:  [name] [name] is a 80 y.o. male presenting with fall (possible syncope) found to have rhabdomyolysis . PMH is significant for CKD I-II, HLD, HTN, Prostate CA   #Fall: patient was down for unknown amount of time, unclear story as to how fall happened. Fall possible 2/2 syncope vs. orthostatic hypertension vs. weakness. Echo showed LVEF 46-27%, grade 1 diastolic dysfunction. MAC with trivial mitral regurgitation and sclerotic aortic valve with trivial aortic regurgitation. Folate wnl but B-12 down to 100. - consider d/c telemetry. Telemetry strip showing sinus rhythm and no sign of arrythmia  - replete B-12  #Rhabdomyolysis, on admissions CK elevated to 5621. Creatinine elevated to 1.55 (baseline 1-1.1). Resolving as CK trending downward, creatinine is 0.88 -hold nephrotoxic agents in setting of AKI and rhabdo  -continue IV fluids  # L arm wound - Possible burn, DVT, cellulitis or abrasion 2/2 to fall. Significant hand edema but neurovascularly intact. LUE xray negative for dislocation or fracture. WBC down trending (8.6 today) - surgery consult to evaluate LUE for burn/soft tissue infection - broaden antibiotic coverage to include Zosyn and Vancomycin - venous doppler to r/o DVT due to edema  - continue wound care recommendation of M/W/F dressing changes using Vaseline gauze  - continue trending WBC.   #Dementia - PT/OT/CSW consulted with recommendation of SNF placement and 24-hr supervision. Family and patient agreeable to SNF. - MMSE to evaluate for cognitive dysfunction   #Acute Conjunctivitis, actively draining, vision intact  - continue Flouroquinolone eyedrops qid   #Nutriition,  concerned for malnutrition due to inadequate oral intake. Nutrition consulted and recommended Ensure PO TID -continue Ensure -change diet to soft as patient does not have teeth and dentures do not fit well.  #Dehydration, AKI on CKD (stage I-II?) Baseline creatinine around 1.0-1.1, on admission up to 1.55 with elevated BUN, resolving Today, creatinine is 0.88 - continue IV fluids and monitoring of CMET   #UTI, Pt with LE and leukocytosis on CBC, remain afibrile, WBC down to 8.6 today, UCx shows no growth to date. - d/c rocephin  # Leukocytosis, resolving. May have been 2/2 to UTI or stress from being down for prolonged period but did have L shift as well. Denies fever, chills, sweats, mucous production and CXR negative for acute infiltrate . Blood and urine cultures with no growth  - continue trending CBC   #Hx of HTN, stable - Hold home HCTZ and Lisinopril in setting of AKI and rhabdo  - Hydralazine PRN for elevated pressures   #Hyperlipidemia  - hold home zocor in setting of rhabdo   #Prostate CA, remote hx per chart   FEN/GI: NS @ 150 cc/hr, soft mechanical  diet  Prophylaxis: SQ heparin    Disposition: pending evaluation and improvement, possible d/c to SNF  Subjective:  Patient reports vomiting after drinking his Ensure supplement. States that he did not rest well.  Objective: Temp:  [97.5 F (36.4 C)-98 F (36.7 C)] 98 F (36.7 C) (05/19 0524) Pulse Rate:  [62-84] 62 (05/19 0524) Resp:  [18] 18 (05/19 0524) BP: (124-134)/(55-66) 132/66 mmHg (05/19 0524) SpO2:  [96 %-97 %] 96 % (05/19 0524) Weight:  [163 lb 11.2 oz (74.254 kg)] 163 lb 11.2 oz (74.254 kg) (05/19 0524)  Physical Exam:  General: NAD, resting in bed Cardiovascular: RRR, no m/g/r appreciated, pulses present bilaterally Respiratory: CTAB, normal work of breathing Abdomen: soft, non-tender, not distended, BS present Extremities: LUE wrapped, tender and limited ROM. Edema and erythema in left hand.    Laboratory:  Recent Labs Lab 12/12/13 1347 12/13/13 0457 12/14/13 0555  WBC 17.5* 10.4 8.6  HGB 14.2 12.8* 12.4*  HCT 40.2 37.6* 37.1*  PLT 248 227 214    Recent Labs Lab 12/12/13 1347 12/12/13 1940 12/13/13 0457 12/14/13 0555  NA 140  --  139 140  K 4.7  --  4.3 4.3  CL 101  --  105 108  CO2 21  --  20 21  BUN 42*  --  31* 18  CREATININE 1.55* 1.35 1.14 0.88  CALCIUM 9.4  --  8.6 8.5  PROT 7.6  --  6.3 5.9*  BILITOT 1.0  --  0.8 0.5  ALKPHOS 93  --  77 70  ALT 20  --  22 24  AST 71*  --  79* 66*  GLUCOSE 132*  --  129* 134*   Imaging/Diagnostic Tests: LEFT SHOULDER - 2+ VIEW, 12/13/2013 IMPRESSION: No definite acute fracture or dislocation identified about the left  Shoulder. Subchondral sclerosis at the humeral head, favor degenerative.   2D Echocardiogram without contrast, 12/13/2013, Impressions: Normal LV wall thickness with LVEF 44-31%, grade 1 diastolic dysfunction. MAC with trivial mitral regurgitation. Sclerotic aortic valve with trivial aortic regurgitation. Mild right atrial enlargement. RV-RA gradient 34 mm mercury, suggests at least upper normal to mildly increased PASP.   Shelly Bombard, Med Student 12/14/2013, 8:13 AM  Upper Level Addendum:  I have seen and evaluated this patient along with [facility] MS4 and reviewed the above note. Additionally:  Thinks hand feels better today. No new complaints. Did vomit x 1 overnight.  Exam: BP 140/73  Pulse 70  Temp(Src) 98.3 F (36.8 C) (Oral)  Resp 16  Ht 5\' 11"  (1.803 m)  Wt 163 lb 11.2 oz (74.254 kg)  BMI 22.84 kg/m2  SpO2 96%  Gen: NAD Heart: RRR, no murmurs Lungs: CTAB via anterior ausc, normal resp effort Abd: NABS, soft, NTND Ext: calves nontender to palpation, No appreciable lower extremity edema bilaterally. LUE wrapped, did not unwrap. Hand appears more swollen today. Brisk cap refill, strong radial pulse. Able to move hand and all fingers well. LLE trochanteric area with stage 1 ulcer,  no signs of superinfection.  A/P: -Fall/syncope: echo without any obvious cause for syncope. Likely was mechanical fall from deconditioning. -Rhabdo: Creatinine improving. Continue IVF. Hold home statin.  -LUE swelling: appearance consistent with burn. Wound care consulted, appreciate recs. Await LUE doppler. Shoulder xray shows no dislocation. Due to worsening swelling of hand, will start vanc/zosyn for abx coverage and consult general surgery. -UTI: urine cx no growth. D/c ceftriaxone. F/u blood cx. -Conjunctivitis: continue cipro drops QID  -Dementia: will perform MMSE when able, to assess mental status. B12 very deficient. Will start PO repletion 1062mcg daily x7 days. -Social: needs SNF placement, social work on [employer].  Chrisandra Netters, MD Family Medicine PGY-2

## 2013-12-14 NOTE — Care Management Note (Unsigned)
    Page 1 of 1   12/14/2013     4:49:09 PM CARE MANAGEMENT NOTE 12/14/2013  Patient:  [name] [name], [name] [name]   Account Number:  [id number]  Date Initiated:  12/14/2013  Documentation initiated by:  GRAVES-BIGELOW,Neenah Canter  Subjective/Objective Assessment:   Pt admitted for fall at home unable to get up for unknown period of time on LUE with rhabdomyolysis and UTI.     Action/Plan:   PT recommendations for SNF. CSW to assist with disposition needs.   Anticipated DC Date:  12/16/2013   Anticipated DC Plan:  SKILLED NURSING FACILITY  In-house referral  Clinical Social Worker      DC Planning Services  CM consult      Choice offered to / List presented to:             Status of service:  In process, will continue to follow Medicare Important Message given?  YES (If response is "NO", the following Medicare IM given date fields will be blank) Date Medicare IM given:  12/12/2013 Date Additional Medicare IM given:    Discharge Disposition:    Per UR Regulation:  Reviewed for med. necessity/level of care/duration of stay  If discussed at [place] of Stay Meetings, dates discussed:    Comments:

## 2013-12-14 NOTE — Progress Notes (Signed)
Patient scored a 20 on MMSE. He was not able to write a sentence or copy the design because he is not able to move his dominant arm (left handed). Zero points given for both of these items. I also attempted to perform a PHQ-9 but patient was not able to follow questions well.  Milele Bynum MS4   Upper level addendum: MMSE performed by [facility] Bynum. I have discussed the results with her. Chrisandra Netters, MD Family Medicine PGY-2

## 2013-12-14 NOTE — Progress Notes (Signed)
FMTS Attending Note Patient seen and examined by me today, discussed with resident team.  Of note, the patient's L arm is markedly more edematous and discolored than yesterday, when he had many bullous lesions and a linear discoloration that raised concern for a burn.  Concern for burn with associated soft tissue infection.  Broaden abx coverage, surgical consult to evaluate.  Doppler US to evaluate for upper extremity VTE in light of found down for indeterminate time.  Dalbert Mayotte, MD

## 2013-12-14 NOTE — Consult Note (Signed)
Reason for Consult:LUE burn Referring Physician: Keshon Markovitz is an 80 y.o. male.  HPI: [name] [name] was in his bathroom over the weekend when he fell onto his left side. He thinks he lay there for ~3 hours before a neighbor found him. He was admitted on Sunday. We were asked to consult 2/2 a possible burn of the LUE with edema and bullae formation. He denies having anything hot in the bathroom that could cause a burn and denies getting burned. He denies striking his head (though he appears to have a forehead laceration) or loss of consciousness. He c/o pain in the LUE and some paresthesias of the left fingers.  Past Medical History  Diagnosis Date  . Hypertension   . Cancer     History reviewed. No pertinent past surgical history.  No family history on file.  Social History:  reports that he has never smoked. He does not have any smokeless tobacco history on file. He reports that he drinks alcohol. He reports that he does not use illicit drugs.  Allergies: No Known Allergies  Medications: I have reviewed the patient's current medications.  Results for orders placed during the hospital encounter of 12/12/13 (from the past 48 hour(s))  APTT     Status: None   Collection Time    12/12/13  1:47 PM      Result Value Ref Range   aPTT 30  24 - 37 seconds  CBC WITH DIFFERENTIAL     Status: Abnormal   Collection Time    12/12/13  1:47 PM      Result Value Ref Range   WBC 17.5 (*) 4.0 - 10.5 K/uL   RBC 4.07 (*) 4.22 - 5.81 MIL/uL   Hemoglobin 14.2  13.0 - 17.0 g/dL   HCT 40.2  39.0 - 52.0 %   MCV 98.8  78.0 - 100.0 fL   MCH 34.9 (*) 26.0 - 34.0 pg   MCHC 35.3  30.0 - 36.0 g/dL   RDW 13.2  11.5 - 15.5 %   Platelets 248  150 - 400 K/uL   Neutrophils Relative % 88 (*) 43 - 77 %   Neutro Abs 15.5 (*) 1.7 - 7.7 K/uL   Lymphocytes Relative 5 (*) 12 - 46 %   Lymphs Abs 0.8  0.7 - 4.0 K/uL   Monocytes Relative 7  3 - 12 %   Monocytes Absolute 1.2 (*) 0.1 - 1.0 K/uL    Eosinophils Relative 0  0 - 5 %   Eosinophils Absolute 0.0  0.0 - 0.7 K/uL   Basophils Relative 0  0 - 1 %   Basophils Absolute 0.0  0.0 - 0.1 K/uL  COMPREHENSIVE METABOLIC PANEL     Status: Abnormal   Collection Time    12/12/13  1:47 PM      Result Value Ref Range   Sodium 140  137 - 147 mEq/L   Potassium 4.7  3.7 - 5.3 mEq/L   Chloride 101  96 - 112 mEq/L   CO2 21  19 - 32 mEq/L   Glucose, Bld 132 (*) 70 - 99 mg/dL   BUN 42 (*) 6 - 23 mg/dL   Creatinine, Ser 1.55 (*) 0.50 - 1.35 mg/dL   Calcium 9.4  8.4 - 10.5 mg/dL   Total Protein 7.6  6.0 - 8.3 g/dL   Albumin 3.6  3.5 - 5.2 g/dL   AST 71 (*) 0 - 37 U/L   ALT 20  0 -  53 U/L   Alkaline Phosphatase 93  39 - 117 U/L   Total Bilirubin 1.0  0.3 - 1.2 mg/dL   GFR calc non Af Amer 41 (*) >90 mL/min   GFR calc Af Amer 47 (*) >90 mL/min   Comment: (NOTE)     The eGFR has been calculated using the CKD EPI equation.     This calculation has not been validated in all clinical situations.     eGFR's persistently <90 mL/min signify possible Chronic Kidney     Disease.  PROTIME-INR     Status: None   Collection Time    12/12/13  1:47 PM      Result Value Ref Range   Prothrombin Time 13.3  11.6 - 15.2 seconds   INR 1.03  0.00 - 1.49  TYPE AND SCREEN     Status: None   Collection Time    12/12/13  1:47 PM      Result Value Ref Range   ABO/RH(D) A POS     Antibody Screen NEG     Sample Expiration 12/15/2013    CK     Status: Abnormal   Collection Time    12/12/13  1:47 PM      Result Value Ref Range   Total CK 5621 (*) 7 - 232 U/L  ABO/RH     Status: None   Collection Time    12/12/13  1:47 PM      Result Value Ref Range   ABO/RH(D) A POS    I-STAT TROPOININ, ED     Status: None   Collection Time    12/12/13  1:56 PM      Result Value Ref Range   Troponin i, poc 0.02  0.00 - 0.08 ng/mL   Comment 3            Comment: Due to the release kinetics of cTnI,     a negative result within the first hours     of the onset of  symptoms does not rule out     myocardial infarction with certainty.     If myocardial infarction is still suspected,     repeat the test at appropriate intervals.  URINALYSIS, ROUTINE W REFLEX MICROSCOPIC     Status: Abnormal   Collection Time    12/12/13  2:47 PM      Result Value Ref Range   Color, Urine YELLOW  YELLOW   Comment: YELLOW   APPearance CLOUDY (*) CLEAR   Specific Gravity, Urine >1.030 (*) 1.005 - 1.030   pH 5.0  5.0 - 8.0   Glucose, UA NEGATIVE  NEGATIVE mg/dL   Hgb urine dipstick LARGE (*) NEGATIVE   Bilirubin Urine SMALL (*) NEGATIVE   Ketones, ur 15 (*) NEGATIVE mg/dL   Protein, ur 30 (*) NEGATIVE mg/dL   Urobilinogen, UA 2.0 (*) 0.0 - 1.0 mg/dL   Nitrite NEGATIVE  NEGATIVE   Leukocytes, UA MODERATE (*) NEGATIVE  URINE MICROSCOPIC-ADD ON     Status: Abnormal   Collection Time    12/12/13  2:47 PM      Result Value Ref Range   Squamous Epithelial / LPF RARE  RARE   WBC, UA 21-50  <3 WBC/hpf   RBC / HPF 11-20  <3 RBC/hpf   Bacteria, UA RARE  RARE   Casts GRANULAR CAST (*) NEGATIVE  URINE RAPID DRUG SCREEN (HOSP PERFORMED)     Status: Abnormal   Collection Time    12/12/13  5:56 PM      Result Value Ref Range   Opiates POSITIVE (*) NONE DETECTED   Cocaine NONE DETECTED  NONE DETECTED   Benzodiazepines NONE DETECTED  NONE DETECTED   Amphetamines NONE DETECTED  NONE DETECTED   Tetrahydrocannabinol NONE DETECTED  NONE DETECTED   Barbiturates NONE DETECTED  NONE DETECTED   Comment:            DRUG SCREEN FOR MEDICAL PURPOSES     ONLY.  IF CONFIRMATION IS NEEDED     FOR ANY PURPOSE, NOTIFY LAB     WITHIN 5 DAYS.                LOWEST DETECTABLE LIMITS     FOR URINE DRUG SCREEN     Drug Class       Cutoff (ng/mL)     Amphetamine      1000     Barbiturate      200     Benzodiazepine   423     Tricyclics       953     Opiates          300     Cocaine          300     THC              50  URINE CULTURE     Status: None   Collection Time    12/12/13   5:56 PM      Result Value Ref Range   Specimen Description URINE, CLEAN CATCH     Special Requests Normal     Culture  Setup Time       Value: 12/13/2013 02:41     Performed at [facility]       Value: NO GROWTH     Performed at [facility]   Culture       Value: NO GROWTH     Performed at [facility]   Report Status 12/13/2013 FINAL    CREATININE, SERUM     Status: Abnormal   Collection Time    12/12/13  7:40 PM      Result Value Ref Range   Creatinine, Ser 1.35  0.50 - 1.35 mg/dL   GFR calc non Af Amer 48 (*) >90 mL/min   GFR calc Af Amer 56 (*) >90 mL/min   Comment: (NOTE)     The eGFR has been calculated using the CKD EPI equation.     This calculation has not been validated in all clinical situations.     eGFR's persistently <90 mL/min signify possible Chronic Kidney     Disease.  TROPONIN I     Status: None   Collection Time    12/12/13  7:40 PM      Result Value Ref Range   Troponin I <0.30  <0.30 ng/mL   Comment:            Due to the release kinetics of cTnI,     a negative result within the first hours     of the onset of symptoms does not rule out     myocardial infarction with certainty.     If myocardial infarction is still suspected,     repeat the test at appropriate intervals.  TSH     Status: Abnormal   Collection Time    12/12/13  7:40 PM      Result Value  Ref Range   TSH 4.580 (*) 0.350 - 4.500 uIU/mL   Comment: Please note change in reference range.  CULTURE, BLOOD (ROUTINE X 2)     Status: None   Collection Time    12/12/13  7:40 PM      Result Value Ref Range   Specimen Description BLOOD RIGHT ANTECUBITAL     Special Requests BOTTLES DRAWN AEROBIC ONLY 5CC     Culture  Setup Time       Value: 12/13/2013 02:11     Performed at [facility]   Culture       Value:        BLOOD CULTURE RECEIVED NO GROWTH TO DATE CULTURE WILL BE HELD FOR 5 DAYS BEFORE ISSUING A FINAL NEGATIVE REPORT     Performed  at [facility]   Report Status PENDING    CULTURE, BLOOD (ROUTINE X 2)     Status: None   Collection Time    12/12/13  7:45 PM      Result Value Ref Range   Specimen Description BLOOD RIGHT HAND     Special Requests BOTTLES DRAWN AEROBIC ONLY 5CC     Culture  Setup Time       Value: 12/13/2013 02:11     Performed at [facility]   Culture       Value:        BLOOD CULTURE RECEIVED NO GROWTH TO DATE CULTURE WILL BE HELD FOR 5 DAYS BEFORE ISSUING A FINAL NEGATIVE REPORT     Performed at [facility]   Report Status PENDING    TROPONIN I     Status: None   Collection Time    12/12/13 11:32 PM      Result Value Ref Range   Troponin I <0.30  <0.30 ng/mL   Comment:            Due to the release kinetics of cTnI,     a negative result within the first hours     of the onset of symptoms does not rule out     myocardial infarction with certainty.     If myocardial infarction is still suspected,     repeat the test at appropriate intervals.  COMPREHENSIVE METABOLIC PANEL     Status: Abnormal   Collection Time    12/13/13  4:57 AM      Result Value Ref Range   Sodium 139  137 - 147 mEq/L   Potassium 4.3  3.7 - 5.3 mEq/L   Chloride 105  96 - 112 mEq/L   CO2 20  19 - 32 mEq/L   Glucose, Bld 129 (*) 70 - 99 mg/dL   BUN 31 (*) 6 - 23 mg/dL   Creatinine, Ser 1.14  0.50 - 1.35 mg/dL   Calcium 8.6  8.4 - 10.5 mg/dL   Total Protein 6.3  6.0 - 8.3 g/dL   Albumin 2.8 (*) 3.5 - 5.2 g/dL   AST 79 (*) 0 - 37 U/L   ALT 22  0 - 53 U/L   Alkaline Phosphatase 77  39 - 117 U/L   Total Bilirubin 0.8  0.3 - 1.2 mg/dL   GFR calc non Af Amer 59 (*) >90 mL/min   GFR calc Af Amer 68 (*) >90 mL/min   Comment: (NOTE)     The eGFR has been calculated using the CKD EPI equation.     This calculation has not been validated in all  clinical situations.     eGFR's persistently <90 mL/min signify possible Chronic Kidney     Disease.  CBC WITH DIFFERENTIAL     Status: Abnormal    Collection Time    12/13/13  4:57 AM      Result Value Ref Range   WBC 10.4  4.0 - 10.5 K/uL   RBC 3.76 (*) 4.22 - 5.81 MIL/uL   Hemoglobin 12.8 (*) 13.0 - 17.0 g/dL   HCT 37.6 (*) 39.0 - 52.0 %   MCV 100.0  78.0 - 100.0 fL   MCH 34.0  26.0 - 34.0 pg   MCHC 34.0  30.0 - 36.0 g/dL   RDW 13.6  11.5 - 15.5 %   Platelets 227  150 - 400 K/uL   Neutrophils Relative % 83 (*) 43 - 77 %   Neutro Abs 8.7 (*) 1.7 - 7.7 K/uL   Lymphocytes Relative 7 (*) 12 - 46 %   Lymphs Abs 0.7  0.7 - 4.0 K/uL   Monocytes Relative 10  3 - 12 %   Monocytes Absolute 1.0  0.1 - 1.0 K/uL   Eosinophils Relative 0  0 - 5 %   Eosinophils Absolute 0.0  0.0 - 0.7 K/uL   Basophils Relative 0  0 - 1 %   Basophils Absolute 0.0  0.0 - 0.1 K/uL  TROPONIN I     Status: None   Collection Time    12/13/13  4:57 AM      Result Value Ref Range   Troponin I <0.30  <0.30 ng/mL   Comment:            Due to the release kinetics of cTnI,     a negative result within the first hours     of the onset of symptoms does not rule out     myocardial infarction with certainty.     If myocardial infarction is still suspected,     repeat the test at appropriate intervals.  CK     Status: Abnormal   Collection Time    12/13/13  4:57 AM      Result Value Ref Range   Total CK 4562 (*) 7 - 232 U/L  VITAMIN B12     Status: Abnormal   Collection Time    12/13/13 11:01 AM      Result Value Ref Range   Vitamin B-12 100 (*) 211 - 911 pg/mL   Comment: Performed at [facility] RBC     Status: None   Collection Time    12/13/13 11:01 AM      Result Value Ref Range   RBC Folate 339  >280 ng/mL   Comment: Reference range not established for pediatric patients.     Performed at [place]     Status: Abnormal   Collection Time    12/14/13  5:55 AM      Result Value Ref Range   Sodium 140  137 - 147 mEq/L   Potassium 4.3  3.7 - 5.3 mEq/L   Chloride 108  96 - 112 mEq/L   CO2 21   19 - 32 mEq/L   Glucose, Bld 134 (*) 70 - 99 mg/dL   BUN 18  6 - 23 mg/dL   Creatinine, Ser 0.88  0.50 - 1.35 mg/dL   Calcium 8.5  8.4 - 10.5 mg/dL   Total Protein 5.9 (*) 6.0 - 8.3 g/dL   Albumin 2.5 (*)  3.5 - 5.2 g/dL   AST 66 (*) 0 - 37 U/L   ALT 24  0 - 53 U/L   Alkaline Phosphatase 70  39 - 117 U/L   Total Bilirubin 0.5  0.3 - 1.2 mg/dL   GFR calc non Af Amer 79 (*) >90 mL/min   GFR calc Af Amer >90  >90 mL/min   Comment: (NOTE)     The eGFR has been calculated using the CKD EPI equation.     This calculation has not been validated in all clinical situations.     eGFR's persistently <90 mL/min signify possible Chronic Kidney     Disease.  CBC WITH DIFFERENTIAL     Status: Abnormal   Collection Time    12/14/13  5:55 AM      Result Value Ref Range   WBC 8.6  4.0 - 10.5 K/uL   RBC 3.67 (*) 4.22 - 5.81 MIL/uL   Hemoglobin 12.4 (*) 13.0 - 17.0 g/dL   HCT 37.1 (*) 39.0 - 52.0 %   MCV 101.1 (*) 78.0 - 100.0 fL   MCH 33.8  26.0 - 34.0 pg   MCHC 33.4  30.0 - 36.0 g/dL   RDW 13.5  11.5 - 15.5 %   Platelets 214  150 - 400 K/uL   Neutrophils Relative % 82 (*) 43 - 77 %   Neutro Abs 7.0  1.7 - 7.7 K/uL   Lymphocytes Relative 9 (*) 12 - 46 %   Lymphs Abs 0.8  0.7 - 4.0 K/uL   Monocytes Relative 8  3 - 12 %   Monocytes Absolute 0.7  0.1 - 1.0 K/uL   Eosinophils Relative 1  0 - 5 %   Eosinophils Absolute 0.1  0.0 - 0.7 K/uL   Basophils Relative 0  0 - 1 %   Basophils Absolute 0.0  0.0 - 0.1 K/uL    Dg Shoulder Left  12/13/2013   CLINICAL DATA:  80 year old male left shoulder pain and decreased range of motion after a fall 3 days ago. Initial encounter.  EXAM: LEFT SHOULDER - 2+ VIEW  COMPARISON:  None.  FINDINGS: Suboptimal positioning on these images. No glenohumeral joint dislocation. Grossly intact visible left clavicle. No proximal left humerus fracture identified. Grossly intact scapula. Subchondral sclerosis at the humeral head, favor degenerative.  IMPRESSION: No definite  acute fracture or dislocation identified about the left shoulder.   Electronically Signed   By: Lars Pinks M.D.   On: 12/13/2013 21:34    Review of Systems  Respiratory: Negative for shortness of breath.   Musculoskeletal: Positive for falls and myalgias (LUE, right shoulder).  Neurological: Positive for sensory change (Left fingers). Negative for seizures and loss of consciousness.  Psychiatric/Behavioral: Negative for memory loss.  All other systems reviewed and are negative.  Blood pressure 132/66, pulse 62, temperature 98 F (36.7 C), temperature source Oral, resp. rate 18, height '5\' 11"'  (1.803 m), weight 163 lb 11.2 oz (74.254 kg), SpO2 96.00%. Physical Exam  Constitutional: He appears well-developed and well-nourished. No distress.  Eyes: No scleral icterus.  Cardiovascular: Normal rate and regular rhythm.   Respiratory: Effort normal. No respiratory distress.  Musculoskeletal:       Left upper arm: He exhibits edema.  Left forearm compartments soft, 2+ radial pulse  Skin: Lesion (Multiple LUE bullae, mixed intact and ruptured) noted.  Psychiatric: He has a normal mood and affect. His speech is normal.    Assessment/Plan: LUE edema, bullae formation likely 2/2  ischemia-reperfusion injury -- No signs of compartment syndrome. CK was checked on arrival and is trending down. It's not unreasonable to treat lesions as a burn and so have rx Silvadene and non-adherent dressings. Keep LUE elevated to help with swelling and regular neurovascular checks. Check left forearm/humerus x-rays though I suspect fracture doubtful. We will follow along with you.  Thank you for this interesting consult on this most pleasant gentleman.    Lisette Abu, PA-C Pager: [phone] General Trauma PA Pager: [phone] 12/14/2013, 1:42 PM

## 2013-12-14 NOTE — Progress Notes (Signed)
ANTIBIOTIC CONSULT NOTE - INITIAL  Pharmacy Consult for Vancomycin and Zosyn Indication: LUE cellulitis/ wound  No Known Allergies  Patient Measurements: Height: 5\' 11"  (180.3 cm) Weight: 163 lb 11.2 oz (74.254 kg) IBW/kg (Calculated) : 75.3 Vital Signs: Temp: 98 F (36.7 C) (05/19 0524) Temp src: Oral (05/19 0524) BP: 132/66 mmHg (05/19 0524) Pulse Rate: 62 (05/19 0524) Intake/Output from previous day: 05/18 0701 - 05/19 0700 In: 4152 [P.O.:647; I.V.:3505] Out: 800 [Urine:800] Intake/Output from this shift: Total I/O In: 320 [P.O.:320] Out: -   Labs:  Recent Labs  12/12/13 1347 12/12/13 1940 12/13/13 0457 12/14/13 0555  WBC 17.5*  --  10.4 8.6  HGB 14.2  --  12.8* 12.4*  PLT 248  --  227 214  CREATININE 1.55* 1.35 1.14 0.88   Estimated Creatinine Clearance: 70.4 ml/min (by C-G formula based on Cr of 0.88). No results found for this basename: VANCOTROUGH, Corlis Leak, VANCORANDOM, GENTTROUGH, GENTPEAK, GENTRANDOM, TOBRATROUGH, TOBRAPEAK, TOBRARND, AMIKACINPEAK, AMIKACINTROU, AMIKACIN,  in the last 72 hours   Microbiology: Recent Results (from the past 720 hour(s))  URINE CULTURE     Status: None   Collection Time    12/12/13  5:56 PM      Result Value Ref Range Status   Specimen Description URINE, CLEAN CATCH   Final   Special Requests Normal   Final   Culture  Setup Time     Final   Value: 12/13/2013 02:41     Performed at [place]     Final   Value: NO GROWTH     Performed at [facility]   Culture     Final   Value: NO GROWTH     Performed at [facility]   Report Status 12/13/2013 FINAL   Final  CULTURE, BLOOD (ROUTINE X 2)     Status: None   Collection Time    12/12/13  7:40 PM      Result Value Ref Range Status   Specimen Description BLOOD RIGHT ANTECUBITAL   Final   Special Requests BOTTLES DRAWN AEROBIC ONLY 5CC   Final   Culture  Setup Time     Final   Value: 12/13/2013 02:11     Performed at [facility]   Culture     Final   Value:        BLOOD CULTURE RECEIVED NO GROWTH TO DATE CULTURE WILL BE HELD FOR 5 DAYS BEFORE ISSUING A FINAL NEGATIVE REPORT     Performed at [facility]   Report Status PENDING   Incomplete  CULTURE, BLOOD (ROUTINE X 2)     Status: None   Collection Time    12/12/13  7:45 PM      Result Value Ref Range Status   Specimen Description BLOOD RIGHT HAND   Final   Special Requests BOTTLES DRAWN AEROBIC ONLY 5CC   Final   Culture  Setup Time     Final   Value: 12/13/2013 02:11     Performed at [facility]   Culture     Final   Value:        BLOOD CULTURE RECEIVED NO GROWTH TO DATE CULTURE WILL BE HELD FOR 5 DAYS BEFORE ISSUING A FINAL NEGATIVE REPORT     Performed at [facility]   Report Status PENDING   Incomplete    Medical History: Past Medical History  Diagnosis Date  . Hypertension   . Cancer  Medications:  Anti-infectives   Start     Dose/Rate Route Frequency Ordered Stop   12/12/13 2200  cefTRIAXone (ROCEPHIN) 1 g in dextrose 5 % 50 mL IVPB  Status:  Discontinued     1 g 100 mL/hr over 30 Minutes Intravenous Every 24 hours 12/12/13 1754 12/14/13 1140     Assessment: 15 YOM s/p fall in AKI with rhabdo and LUE wound concerning for cellulitis to start vancomycin and Zosyn. Afebrile. WBC wnl. LUE wound is worse today. Surgery has been consulted. Current CrCl~ 57mL/min.   12/12/13 Blood >> ngtd 12/12/13 Urine >> negative  Goal of Therapy:  Vancomycin trough level 15-20 mcg/ml until osteo has been ruled out  Plan:  1. Zosyn 3.375g IV q8h - 4hr infusion. 2. Vancomycin 1g IV q12h.  3. Monitor renal function, clinical status, and cultures.  4. Vancomycin trough as appropriate once at steady state.   Sloan Leiter, PharmD, BCPS Clinical Pharmacist [phone] 12/14/2013,12:10 PM

## 2013-12-14 NOTE — Progress Notes (Signed)
FMTS Attending Note Pt seen and examined by me today, discussed with resident team and I agree with Dr Lennie Odor note for today.  Pt's left upper extremity appears more edematous and discolored than it did on my exam yesterday.  Maintains palpable radial pulses and handgrip, some decreased sensation in fingers but is present.  Agree with treatment as burn, concern for secondary soft tissue infection. Broad spectrum abx being started at the present time. Surgical consult for assessment for possible need for debridement.   Pressure ulcer of L greater trochanter noted on exam today, wound care has addressed.  Dalbert Mayotte, MD

## 2013-12-14 NOTE — Consult Note (Signed)
I agree. Without a heat source, this likely represents an ischemia reprofusion injury. Compartments are soft and motor function is at least partially preserved. Elevate to reduce swelling and do Silvadene dressings as above. Patient examined and I agree with the assessment and plan  Georganna Skeans, MD, MPH, FACS Trauma: [phone] General Surgery: [phone]  12/14/2013 2:42 PM

## 2013-12-14 NOTE — Progress Notes (Signed)
VASCULAR LAB PRELIMINARY  PRELIMINARY  PRELIMINARY  PRELIMINARY  Left upper extremity venous duplex completed.    Preliminary report:  Left:  No obvious evidence of DVT or superficial thrombosis.  Limited by position, open wound, blisters.   Nani Ravens, RVT 12/14/2013, 1:27 PM

## 2013-12-14 NOTE — Consult Note (Signed)
WOC wound follow up Asked to evaluate the left trochanter for possible sDTI, however after review of the record and the appearance of the wound it does appear this is an abrasion similar to the area on his forehead/arm on the same side in which he fell.   Wound type:  Abrasion left trochanter, skin flap mostly intact Measurement: 3.0cm x 4.0cm x 0.2cm  Wound TCY:ELYHT, pink, but does have skin flap intact over aprox. 75% of the wound bed, the remainder of the wound bed is pink, moist.  Drainage (amount, consistency, odor) minimal, serous Periwound: intact Dressing procedure/placement/frequency:  Foam for the left trochanter wound, change every 3 days and PRN soilage. For protection, insulation, and to encourage re epithelization.   Discussed POC with patient and bedside nurse.  Re consult if needed, will not follow at this time. Thanks  Quetzalli Clos Kellogg, [place] [phone])

## 2013-12-14 NOTE — Progress Notes (Signed)
Utilization review completed.  

## 2013-12-15 DIAGNOSIS — R609 Edema, unspecified: Secondary | ICD-10-CM | POA: Diagnosis present

## 2013-12-15 LAB — COMPREHENSIVE METABOLIC PANEL
ALBUMIN: 2.1 g/dL — AB (ref 3.5–5.2)
ALT: 32 U/L (ref 0–53)
AST: 56 U/L — AB (ref 0–37)
Alkaline Phosphatase: 61 U/L (ref 39–117)
BUN: 12 mg/dL (ref 6–23)
CALCIUM: 8 mg/dL — AB (ref 8.4–10.5)
CO2: 23 mEq/L (ref 19–32)
CREATININE: 0.87 mg/dL (ref 0.50–1.35)
Chloride: 108 mEq/L (ref 96–112)
GFR calc Af Amer: >90 mL/min (ref >90)
GFR calc non Af Amer: 79 mL/min — ABNORMAL LOW (ref >90)
Glucose, Bld: 113 mg/dL — ABNORMAL HIGH (ref 70–99)
Potassium: 4.2 mEq/L (ref 3.7–5.3)
Sodium: 139 mEq/L (ref 137–147)
TOTAL PROTEIN: 5 g/dL — AB (ref 6.0–8.3)
Total Bilirubin: 0.4 mg/dL (ref 0.3–1.2)

## 2013-12-15 LAB — CBC WITH DIFFERENTIAL/PLATELET
Basophils Absolute: 0 10*3/uL (ref 0.0–0.1)
Basophils Relative: 0 % (ref 0–1)
EOS ABS: 0.2 10*3/uL (ref 0.0–0.7)
EOS PCT: 3 % (ref 0–5)
HEMATOCRIT: 32.4 % — AB (ref 39.0–52.0)
HEMOGLOBIN: 10.9 g/dL — AB (ref 13.0–17.0)
LYMPHS ABS: 1.2 10*3/uL (ref 0.7–4.0)
Lymphocytes Relative: 16 % (ref 12–46)
MCH: 33.6 pg (ref 26.0–34.0)
MCHC: 33.6 g/dL (ref 30.0–36.0)
MCV: 100 fL (ref 78.0–100.0)
MONO ABS: 0.6 10*3/uL (ref 0.1–1.0)
Monocytes Relative: 9 % (ref 3–12)
Neutro Abs: 5.3 10*3/uL (ref 1.7–7.7)
Neutrophils Relative %: 72 % (ref 43–77)
Platelets: 211 10*3/uL (ref 150–400)
RBC: 3.24 MIL/uL — AB (ref 4.22–5.81)
RDW: 13.3 % (ref 11.5–15.5)
WBC: 7.3 10*3/uL (ref 4.0–10.5)

## 2013-12-15 LAB — GLUCOSE, CAPILLARY: Glucose-Capillary: 101 mg/dL — ABNORMAL HIGH (ref 70–99)

## 2013-12-15 MED ORDER — SIMVASTATIN 10 MG PO TABS
10.0000 mg | ORAL_TABLET | Freq: Every day | ORAL | Status: DC
  Filled 2013-12-15: qty 1

## 2013-12-15 MED ORDER — CYANOCOBALAMIN 1000 MCG PO TABS: 1000.0000 ug | ORAL_TABLET | Freq: Every day | ORAL | Status: DC

## 2013-12-15 MED ORDER — CYANOCOBALAMIN 1000 MCG/ML IJ SOLN
1000.0000 ug | Freq: Once | INTRAMUSCULAR | Status: DC
  Filled 2013-12-15 (×2): qty 1

## 2013-12-15 MED ORDER — SILVER SULFADIAZINE 1 % EX CREA: TOPICAL_CREAM | Freq: Two times a day (BID) | CUTANEOUS | Status: DC

## 2013-12-15 MED ORDER — HYDROCHLOROTHIAZIDE 25 MG PO TABS
25.0000 mg | ORAL_TABLET | Freq: Every day | ORAL | Status: DC
  Administered 2013-12-15: 25 mg via ORAL
  Filled 2013-12-15: qty 1

## 2013-12-15 NOTE — Discharge Summary (Signed)
Physician Discharge Summary  Patient ID: [name] [name] MRN: [id number] DOB: 1932-06-29 Age: 80 y.o.  Admit date: 12/12/2013 Discharge date: 12/15/2013 Admitting Physician: Alveda Reasons, MD  PCP: Rosemarie Ax, MD  Consultants:Surgery    Discharge Diagnosis: Principal Problem:   Rhabdomyolysis Active Problems:   DEMENTIA   HYPERTENSION, BENIGN SYSTEMIC   CHRONIC KIDNEY DISEASE STAGE II (MILD)   Acute kidney injury   Protein-calorie malnutrition, severe   Vitamin B12 deficiency   Reperfusion edema    Hospital Course Pt is an 80 y/o M with PMHx of HTN, HLD, dementia who lives by himself and was found to be down for unknown time.  Initial labs in the ED showed AKI with elevation in creatinine to 1.55 (baseline around 1), CK to 5600, UA with gross hematuria and negative micro, and leukocytosis to 17.5  #Rhabdomyolysis - Pt found to be in rhabdo upon admission.  He was initial given a 1 L NS bolus and run at 150 cc/hr.  Pt improved during his stay, his CK was trending down, and his kidney function returned to baseline.  Stable at discharge  # Acute Kidney Injury on CKD Stage II?- This was felt to be 2/2 rhabdo and trended down with fluids.  Creatinine was < 1 at time of discharge.   # LUE reperfusion/ischemia injury - Pt denied burn injury upon presentation, but was found to have bullous lesions along with diffuse erythema and edema of his LUE.  Wound care was initial consulted and recommended wet dressings.  As well, there was a concern for UE DVT and duplex was done which did not show any clot formation.  Vancomycin and Zosyn was added on at one point after BCx had been drawn for concern of superinfection of this wound.  However, pt was afebrile, w resolving leukocytosis, and his BCx were negative.  Surgery was consulted to r/o compartment syndrome or surgical need, and x-rays of his entire arm were done, showing no fx.  They felt this injury was 2/2 falling and with ischemia and  reperfusion.  Recommended silvadene and elevation and his arm improved during his hospital stay.  Vanc/Zosyn was stopped, and he was sent out with requests of wet dressings with silvadene for resolution.    # Possible UTI- Pt found to have + UA but negative UCx, he received rocephin x 3 doses.  No dysuria at d/c.    # Dementia - Pt on aricept at home, continued.  MMSE completed and was 20/30 during his stay.  # HTN - Home medications held due to [facility] and AKI.  Restarted on HCTZ prior to d/c and lisinopril held until f/u.    # B12 Deficiency - Due to his dementia and elevated MCV, B12 was drawn and was 100.  He was started on PO and given an injection prior to discharge.   # Severe Protein Calorie Malnutrition in setting of acute illness - Nutrition consulted during stay, recommended ensure during his hospital stay.          Discharge PE   Filed Vitals:   12/15/13 0530  BP: 120/58  Pulse: 75  Temp: 97.5 F (36.4 C)  Resp: 18   Gen: NAD  Heart: RRR, no murmurs  Lungs: CTAB via anterior ausc, normal resp effort  Abd: NABS, soft, NTND  Ext: No appreciable lower extremity edema bilaterally. LUE wrapped, did not unwrap. Hand appears stable in swelling today. Brisk cap refill, strong radial pulse. Able to move hand and all fingers  well. LLE trochanteric area with stage 1 ulcer, no signs of superinfection.       Procedures/Imaging:  Dg Chest 2 View  12/12/2013  .  IMPRESSION: Bibasilar atelectasis.   Electronically Signed   By: Maryclare Bean M.D.   On: 12/12/2013 13:16   Dg Forearm Left  12/15/2013 IMPRESSION: No acute fracture or subluxation.  Diffuse soft tissue swelling.   Electronically Signed   By: Lahoma Crocker M.D.   On: 12/15/2013 07:58   Ct Head Wo Contrast  12/12/2013    IMPRESSION: No acute intracranial pathology.  Chronic changes are noted.   Electronically Signed   By: Maryclare Bean M.D.   On: 12/12/2013 13:15   Dg Shoulder Left  12/13/2013     IMPRESSION: No definite acute  fracture or dislocation identified about the left shoulder.   Electronically Signed   By: Lars Pinks M.D.   On: 12/13/2013 21:34   Dg Humerus Left  12/15/2013 IMPRESSION: No acute fracture or subluxation. Degenerative changes as described above.   Electronically Signed   By: Lahoma Crocker M.D.   On: 12/15/2013 08:04    Labs  CBC  Recent Labs Lab 12/13/13 0457 12/14/13 0555 12/15/13 0655  WBC 10.4 8.6 7.3  HGB 12.8* 12.4* 10.9*  HCT 37.6* 37.1* 32.4*  PLT 227 214 211   BMET  Recent Labs Lab 12/13/13 0457 12/14/13 0555 12/15/13 0655  NA 139 140 139  K 4.3 4.3 4.2  CL 105 108 108  CO2 20 21 23   BUN 31* 18 12  CREATININE 1.14 0.88 0.87  CALCIUM 8.6 8.5 8.0*  PROT 6.3 5.9* 5.0*  BILITOT 0.8 0.5 0.4  ALKPHOS 77 70 61  ALT 22 24 32  AST 79* 66* 56*  GLUCOSE 129* 134* 113*    Initial CK - 5600   Lab Results  Component Value Date   CKTOTAL 4562* 12/13/2013   CKMB 2.1 09/30/2009   TROPONINI <0.30 12/13/2013   Lab Results  Component Value Date   VITAMINB12 100* 12/13/2013      Patient condition at time of discharge/disposition: stable  Disposition-SNF   Follow up issues: 1) B12 - Recheck in about 1 week and adjust daily dose accordingly.  Consider following every 3-4 months until stable 2) Acute Kidney Injury - 2/2 Rhabdo, creatinine stable at d/c, restarted on HCTZ.  If pressure high, could restart low dose lisinopril   Discharge follow up:    Follow-up Information   Follow up with Rosemarie Ax, MD. (Friday 5/29 @ 3:30 PM )    Specialty:  Family Medicine   Contact information:   [place] [address] [phone]        Discharge Instructions: Please refer to Patient Instructions section of EMR for full details.  Patient was counseled important signs and symptoms that should prompt return to medical care, changes in medications, dietary instructions, activity restrictions, and follow up appointments.  Significant instructions noted  below:    Discharge Medications   Medication List    STOP taking these medications       lisinopril 40 MG tablet  Commonly known as:  PRINIVIL,ZESTRIL      TAKE these medications       cyanocobalamin 1000 MCG tablet  Take 1 tablet (1,000 mcg total) by mouth daily.     donepezil 5 MG tablet  Commonly known as:  ARICEPT  Take 1 tablet (5 mg total) by mouth every morning.  hydrochlorothiazide 25 MG tablet  Commonly known as:  HYDRODIURIL  Take 1 tablet (25 mg total) by mouth daily.     silver sulfADIAZINE 1 % cream  Commonly known as:  SILVADENE  Apply topically 2 (two) times daily.     simvastatin 10 MG tablet  Commonly known as:  ZOCOR  Take 1 tablet (10 mg total) by mouth daily.        Nolon Rod, DO of [facility] 12/15/2013 11:42 AM

## 2013-12-15 NOTE — Progress Notes (Signed)
Patient ID: [name] [name], male   DOB: [birth year], 80 y.o.   MRN: [id number]   LOS: 3 days   Subjective: Feeling pretty fair. Denies increased pain in LUE.   Objective: Vital signs in last 24 hours: Temp:  [97.5 F (36.4 C)-98.3 F (36.8 C)] 97.5 F (36.4 C) (05/20 0530) Pulse Rate:  [70-82] 75 (05/20 0530) Resp:  [16-18] 18 (05/20 0530) BP: (120-140)/(58-73) 120/58 mmHg (05/20 0530) SpO2:  [96 %-100 %] 98 % (05/20 0530) Weight:  [163 lb 5.8 oz (74.1 kg)] 163 lb 5.8 oz (74.1 kg) (05/20 0530) Last BM Date: 12/14/13   Laboratory  CBC  Recent Labs  12/14/13 0555 12/15/13 0655  WBC 8.6 7.3  HGB 12.4* 10.9*  HCT 37.1* 32.4*  PLT 214 211   BMET  Recent Labs  12/14/13 0555 12/15/13 0655  NA 140 139  K 4.3 4.2  CL 108 108  CO2 21 23  GLUCOSE 134* 113*  BUN 18 12  CREATININE 0.88 0.87  CALCIUM 8.5 8.0*    Physical Exam Extremities: LUE: Compartments soft, 2+ radial pulse   Assessment/Plan: LUE ischemia-reperfusion injury -- CPM    Lisette Abu, PA-C Pager: [phone] General Trauma PA Pager: [phone]  12/15/2013

## 2013-12-15 NOTE — Progress Notes (Signed)
No compartment syndrome.  This patient has been seen and I agree with the findings and treatment plan.  Kathryne Eriksson. Dahlia Bailiff, MD, [place] [phone] (pager) [phone] (direct pager) Trauma Surgeon

## 2013-12-15 NOTE — Progress Notes (Signed)
Family Medicine Teaching Service Daily Progress Note Intern Pager: [phone]  Patient name: [name] [name] Medical record number: [id number] Date of birth: [birth year] Age: 80 y.o. Gender: male  Primary Care Provider: Rosemarie Ax, MD Consultants: Surgery, Wound   Code Status: full  Assessment and Plan:  [name] [name] is a 80 y.o. male presenting with fall (possible syncope) found to have rhabdomyolysis . PMH is significant for CKD I-II, HLD, HTN, Prostate CA   # L arm wound - Possible burn, DVT, cellulitis or abrasion 2/2 to fall. Significant hand edema but neurovascularly intact. LUE xray negative for dislocation or fracture. WBC down trending (7.3 today). Left upper extremity venous duplex completed with no obvious evidence of DVT or superficial thrombosis - surgery consulted, LUE edema and bullae formation likely 2/2 ischemia-reperfusion injury. No signs of compartment syndrome. Treat lesions as a burn with Silvadene and non-adherent dressings. Keep LUE elevated to help with swelling and regular neurovascular checks. Surgery will follow. Thank you for consult and recommendations - d/c Zosyn and Vancomycin as WBC continues downward trend and no signs of infection - continue wound care dressing change every shift - continue trending WBC  # LLE trochanteric area with stage 1 ulcer, no signs of superinfection. -continue wound dressing per wound recommendation  #Dementia, MMSE 20/24, PT/OT/CSW consulted with recommendation of SNF placement and 24-hr supervision. Family and patient agreeable to SNF.  - work with CSW to arrange SNF placement  #Acute Conjunctivitis, actively draining, vision intact  - continue Flouroquinolone eyedrops qid   #Nutriition, concerned for malnutrition due to inadequate oral intake. Nutrition consulted and recommended Ensure PO TID  -continue Ensure supplementation -change diet to soft as patient does not have teeth and dentures do not fit well.   #Fall:  patient was down for unknown amount of time, unclear story as to how fall happened. Fall possible 2/2 syncope vs. orthostatic hypertension vs. weakness. Echo showed LVEF 26-37%, grade 1 diastolic dysfunction. MAC with trivial mitral regurgitation and sclerotic aortic valve with trivial aortic regurgitation. Folate wnl but B-12 down to 100. B-12 injection given  - d/c telemetry. Telemetry strip showing sinus rhythm and no sign of arrythmia    #Rhabdomyolysis, on admissions CK elevated to 5621. Creatinine elevated to 1.55 (baseline 1-1.1). Resolving as CK trending downward, creatinine is 0.88  -hold nephrotoxic agents in setting of AKI and rhabdo  -continue IV fluids   #Dehydration, AKI on CKD (stage I-II?) Baseline creatinine around 1.0-1.1, on admission up to 1.55 with elevated BUN, resolving Today, creatinine is 0.88  - continue IV fluids and monitoring of CMET   #UTI, Pt with LE and leukocytosis on CBC, remain afibrile, WBC down to 7.3 today, UCx shows no growth to date.  - d/c rocephin   # Leukocytosis, resolving. May have been 2/2 to UTI or stress from being down for prolonged period but did have L shift as well. Denies fever, chills, sweats, mucous production and CXR negative for acute infiltrate . Blood and urine cultures with no growth  - continue trending CBC   #Hx of HTN, stable  - Hold home HCTZ and Lisinopril in setting of AKI and rhabdo  - Hydralazine PRN for elevated pressures   #Hyperlipidemia  - hold home zocor in setting of rhabdo   #Prostate CA, remote hx per chart   FEN/GI: NS @ 150 cc/hr, soft mechanical diet  Prophylaxis: SQ heparin   Disposition: pending evaluation and improvement, possible d/c to SNF soon   Subjective:  No acute events  overnight. Patient denies vomiting. Patient feels his left arm/hand is feeling better/less pain   Objective: Temp:  [97.5 F (36.4 C)-98.3 F (36.8 C)] 97.5 F (36.4 C) (05/20 0530) Pulse Rate:  [70-82] 75 (05/20  0530) Resp:  [16-18] 18 (05/20 0530) BP: (120-140)/(58-73) 120/58 mmHg (05/20 0530) SpO2:  [96 %-100 %] 98 % (05/20 0530) Weight:  [163 lb 5.8 oz (74.1 kg)] 163 lb 5.8 oz (74.1 kg) (05/20 0530)  Physical Exam: General: NAD, eating breakfast in bed Cardiovascular: RRR, no m/g/r appreciated Respiratory: CTAB, normal work of breathing Abdomen: soft, non-tender, non-distended, +BS Extremities: Palpable pulses throughout. LUE wrapped, hand edema and pain improved from yesterday. Patient able to squeeze hand, pinprick sensation intact, brisk capillary refill in fingers, able to move hand and fingers well without pain. LLE trochanteric area with stage 1 ulcer, no signs of superinfection.   Laboratory:  Recent Labs Lab 12/13/13 0457 12/14/13 0555 12/15/13 0655  WBC 10.4 8.6 7.3  HGB 12.8* 12.4* 10.9*  HCT 37.6* 37.1* 32.4*  PLT 227 214 211    Recent Labs Lab 12/13/13 0457 12/14/13 0555 12/15/13 0655  NA 139 140 139  K 4.3 4.3 4.2  CL 105 108 108  CO2 20 21 23   BUN 31* 18 12  CREATININE 1.14 0.88 0.87  CALCIUM 8.6 8.5 8.0*  PROT 6.3 5.9* 5.0*  BILITOT 0.8 0.5 0.4  ALKPHOS 77 70 61  ALT 22 24 32  AST 79* 66* 56*  GLUCOSE 129* 134* 113*   Imaging/Diagnostic Tests: Left upper extremity venous duplex, 12/14/2013. Preliminary report:  Left:  No obvious evidence of DVT or superficial thrombosis.  Limited by position, open wound, blisters.  LEFT HUMERUS - 2+ VIEW x-ray, 12/14/2013. No acute fracture or subluxation. Degenerative changes in [facility] joint. Mild degenerative changes glenohumeral joint  LEFT FOREARM - 2 VIEW x-ray, 12/14/2013, No acute fracture or subluxation.  Diffuse soft tissue swelling.    Shelly Bombard, Med Student 12/15/2013, 9:29 AM  Upper Level Addendum:  I have seen and evaluated this patient along with [facility] MS4 and reviewed the above note.   Additionally:  No new complaints, doing well  Exam:  Filed Vitals:   12/15/13 0530  BP: 120/58  Pulse: 75   Temp: 97.5 F (36.4 C)  Resp: 18    Gen: NAD  Heart: RRR, no murmurs  Lungs: CTAB via anterior ausc, normal resp effort  Abd: NABS, soft, NTND  Ext: No appreciable lower extremity edema bilaterally. LUE wrapped, did not unwrap. Hand appears stable in swelling today. Brisk cap refill, strong radial pulse. Able to move hand and all fingers well. LLE trochanteric area with stage 1 ulcer, no signs of superinfection.   A/P:  -Fall/syncope: echo without any obvious cause for syncope. Likely was mechanical fall from deconditioning.  -Rhabdo: Creatinine improving. Continue IVF. Hold home statin.  -LUE swelling: c/w ischemia/reperfusion injury per surgery consult, appreciate recs. D/C vanc/zosyn as no evidence of infection, no leukocytosis, no fever, and negative BC to date.  -UTI: urine cx no growth. D/c ceftriaxone. F/u blood cx.  -Conjunctivitis: continue cipro drops QID  -Dementia: MMSE 20/30, some limitations with inability to use hand 2/2 injury.  B12 very deficient. Will start PO repletion 1023mcg daily x7 days.  -Social: needs SNF placement, social work on Mining engineer.  Tamela Oddi Awanda Mink, DO of [facility] 12/15/2013, 9:58 AM

## 2013-12-15 NOTE — Discharge Instructions (Signed)
Mr. Reier you were seen in the hospital for injuries you received due to your fall. Your kidneys weren't working well.  We gave you fluids and now they have improved.  The injury to your arm including the swelling and redness was because of your fall and the amount to time you laid on your arm.  There is no sign of infection or blood clot.  Daily dressing changes with ointment should help with healing and prevent infection.  Rhabdomyolysis Rhabdomyolysis is the breakdown of muscle fibers due to injury. The injury may come from physical damage to the muscle like an injury but other causes are:  High fever (hyperthermia).  Seizures (convulsions).  Low phosphate levels.  Diseases of metabolism.  Heatstroke.  Drug toxicity.  Over exertion.  Alcoholism.  Muscle is cut off from oxygen (anoxia).  The squeezing of nerves and blood vessels (compartment syndrome). Some drugs which may cause the breakdown of muscle are:  Antibiotics.  Statins.  Alcohol.  Animal toxins. Myoglobin is a substance which helps muscle use oxygen. When the muscle is damaged, the myoglobin is released into the bloodstream. It is filtered out of the bloodstream by the kidneys. Myoglobin may block up the kidneys. This may cause damage, such as kidney failure. It also breaks down into other damaging toxic parts, which also cause kidney failure.  SYMPTOMS   Dark, red, or tea colored urine.  Weakness of affected muscles.  Weight gain from water retention.  Joint aches and pains.  Irregular heart from high potassium in the blood.  Muscle tenderness or aching.  Generalized weakness.  Seizures.  Feeling tired (fatigue). DIAGNOSIS  Your caregiver may find muscle tenderness on exam and suspect the problem. Urine tests and blood work can confirm the problem. TREATMENT   Early and aggressive treatment with large amounts of fluids may help prevent kidney failure.  Water producing medicine (diuretic) may  be used to help flush the kidneys.  High potassium and calcium problems (electrolyte) in your blood may need treatment. HOME CARE INSTRUCTIONS  This problem is usually cared for in a hospital. If you are allowed to go home and require dialysis, make sure you keep all appointments for lab work and dialysis. Not doing so could result in death. Document Released: 2004/07/23 Document Revised: 10/07/2011 Document Reviewed: 01/09/2009 [facility] Patient Information 2014 [place].

## 2013-12-16 ENCOUNTER — Non-Acute Institutional Stay (SKILLED_NURSING_FACILITY): Payer: Medicare Other | Admitting: Internal Medicine

## 2013-12-16 ENCOUNTER — Encounter: Payer: Self-pay | Admitting: Internal Medicine

## 2013-12-16 DIAGNOSIS — D539 Nutritional anemia, unspecified: Secondary | ICD-10-CM

## 2013-12-16 DIAGNOSIS — N390 Urinary tract infection, site not specified: Secondary | ICD-10-CM

## 2013-12-16 DIAGNOSIS — R609 Edema, unspecified: Secondary | ICD-10-CM

## 2013-12-16 DIAGNOSIS — N179 Acute kidney failure, unspecified: Secondary | ICD-10-CM

## 2013-12-16 DIAGNOSIS — I10 Essential (primary) hypertension: Secondary | ICD-10-CM

## 2013-12-16 DIAGNOSIS — M6282 Rhabdomyolysis: Secondary | ICD-10-CM

## 2013-12-16 DIAGNOSIS — E43 Unspecified severe protein-calorie malnutrition: Secondary | ICD-10-CM

## 2013-12-16 DIAGNOSIS — F0390 Unspecified dementia without behavioral disturbance: Secondary | ICD-10-CM

## 2013-12-16 NOTE — Discharge Summary (Signed)
FMTS Attending Note Patient seen and examined by me on day of discharge, I agree with discharge plan as per Dr Burt Ek note.  [name] Mayotte, MD

## 2013-12-17 ENCOUNTER — Encounter: Payer: Self-pay | Admitting: Internal Medicine

## 2013-12-17 DIAGNOSIS — N390 Urinary tract infection, site not specified: Secondary | ICD-10-CM | POA: Insufficient documentation

## 2013-12-17 NOTE — Assessment & Plan Note (Signed)
UA pos, Cx neg; pt received 3 days rocephin

## 2013-12-17 NOTE — Assessment & Plan Note (Signed)
Felt 2/2/ rhabdo and trended down with fluids; Cr <1 at d/c

## 2013-12-17 NOTE — Assessment & Plan Note (Signed)
Diet plus supplements

## 2013-12-17 NOTE — Assessment & Plan Note (Signed)
Continue aricept;MMSE this hosp was 20/30.

## 2013-12-17 NOTE — Progress Notes (Signed)
MRN: [id number] Name: [name] [name]  Sex: male Age: 80 y.o. DOB: 02/15/1932  [place] #: [facility] Facility/Room: 119 Level Of Care: SNF Provider: Hennie Duos Emergency Contacts: Extended Emergency Contact Information Primary Emergency Contact: Wooley,[name] Address: [place], [place] [address] [place] Phone: [phone] Relation: Daughter  Code Status:FULL   Allergies: Review of patient's allergies indicates no known allergies.  Chief Complaint  Patient presents with  . nursing home admission    HPI: Patient is 80 y.o. male who is admitted to SNF with generalized weakness after rhabdo for OT/PT.  Past Medical History  Diagnosis Date  . Hypertension   . Cancer     History reviewed. No pertinent past surgical history.    Medication List       This list is accurate as of: 12/16/13 11:59 PM.  Always use your most recent med list.               cyanocobalamin 1000 MCG tablet  Take 1 tablet (1,000 mcg total) by mouth daily.     donepezil 5 MG tablet  Commonly known as:  ARICEPT  Take 1 tablet (5 mg total) by mouth every morning.     hydrochlorothiazide 25 MG tablet  Commonly known as:  HYDRODIURIL  Take 1 tablet (25 mg total) by mouth daily.     silver sulfADIAZINE 1 % cream  Commonly known as:  SILVADENE  Apply topically 2 (two) times daily.     simvastatin 10 MG tablet  Commonly known as:  ZOCOR  Take 1 tablet (10 mg total) by mouth daily.        No orders of the defined types were placed in this encounter.    Immunization History  Administered Date(s) Administered  . Influenza Split 05/08/2011, 05/07/2012  . Influenza Whole 04/22/2008, 05/25/2009  . Pneumococcal Polysaccharide-23 04/10/2010, 12/15/2013  . Td 04/10/2010    History  Substance Use Topics  . Smoking status: Never Smoker   . Smokeless tobacco: Not on file  . Alcohol Use: Yes     Comment: Pt binges on alcohol per pt and family     Family history is noncontributory    Review of Systems  DATA OBTAINED: from patient, nurse; limited some by dementia GENERAL: no fevers, fatigue, appetite changes SKIN: No itching, rash; wounds on arm painful EYES: No eye pain, redness, discharge EARS: No earache, tinnitus, change in hearing NOSE: No congestion, drainage or bleeding  MOUTH/THROAT: No mouth or tooth pain, No sore throat RESPIRATORY: No cough, wheezing, SOB CARDIAC: No chest pain, palpitations, lower extremity edema  GI: No abdominal pain, No N/V/D or constipation, No heartburn or reflux  GU: No dysuria, frequency or urgency, or incontinence  MUSCULOSKELETAL: No unrelieved bone/joint pain NEUROLOGIC: No headache, dizziness  PSYCHIATRIC:  No behavior issue.   Filed Vitals:   12/16/13 1910  BP: 111/63  Pulse: 66  Temp: 97.1 F (36.2 C)  Resp: 18    Physical Exam  GENERAL APPEARANCE: Alert, min conversant. Appropriately groomed. No acute distress.  SKIN: No diaphoresis rash; L arm with several shallow ulcers and large bullae; no cellulitis, mild diffuse swelling; pt appears uncomfortable with witnesed dressing change HEAD: Normocephalic, atraumatic  EYES: Conjunctiva/lids clear. Pupils round, reactive. EOMs intact.  EARS: External exam WNL, canals clear. Hearing grossly normal.  NOSE: No deformity or discharge.  MOUTH/THROAT: Lips w/o lesions.  RESPIRATORY: Breathing is even, unlabored. Lung sounds are  clear   CARDIOVASCULAR: Heart RRR no murmurs, rubs or gallops. No peripheral edema.  GASTROINTESTINAL: Abdomen is soft, non-tender, not distended w/ normal bowel sounds GENITOURINARY: Bladder non tender, not distended  MUSCULOSKELETAL: No abnormal joints or musculature NEUROLOGIC: Oriented X1. Cranial nerves 2-12 grossly intact PSYCHIATRIC: Mood and affect appropriate to situation c/w dementia, no behavioral issues  Patient Active Problem List   Diagnosis Date Noted  . UTI (lower urinary tract infection)  12/17/2013  . Reperfusion edema 12/15/2013  . Vitamin B12 deficiency 12/14/2013  . Protein-calorie malnutrition, severe 12/13/2013  . Rhabdomyolysis 12/12/2013  . Acute kidney injury 12/12/2013  . Anemia, macrocytic 10/20/2010  . Dementia without behavioral disturbance 10/11/2009  . PROSTATE CANCER 09/25/2006  . HYPERCHOLESTEROLEMIA 09/25/2006  . HYPERTENSION, BENIGN SYSTEMIC 09/25/2006  . CHRONIC KIDNEY DISEASE STAGE II (MILD) 09/25/2006  . OSTEOARTHRITIS, MULTI SITES 09/25/2006    CBC    Component Value Date/Time   WBC 7.3 12/15/2013 0655   RBC 3.24* 12/15/2013 0655   HGB 10.9* 12/15/2013 0655   HCT 32.4* 12/15/2013 0655   PLT 211 12/15/2013 0655   MCV 100.0 12/15/2013 0655   LYMPHSABS 1.2 12/15/2013 0655   MONOABS 0.6 12/15/2013 0655   EOSABS 0.2 12/15/2013 0655   BASOSABS 0.0 12/15/2013 0655    CMP     Component Value Date/Time   NA 139 12/15/2013 0655   K 4.2 12/15/2013 0655   CL 108 12/15/2013 0655   CO2 23 12/15/2013 0655   GLUCOSE 113* 12/15/2013 0655   BUN 12 12/15/2013 0655   CREATININE 0.87 12/15/2013 0655   CREATININE 1.14 09/10/2013 1640   CALCIUM 8.0* 12/15/2013 0655   PROT 5.0* 12/15/2013 0655   ALBUMIN 2.1* 12/15/2013 0655   AST 56* 12/15/2013 0655   ALT 32 12/15/2013 0655   ALKPHOS 61 12/15/2013 0655   BILITOT 0.4 12/15/2013 0655   GFRNONAA 79* 12/15/2013 0655   GFRAA >90 12/15/2013 0655    Assessment and Plan  Rhabdomyolysis Pt found to be in rhabdo upon admission. He was initial given a 1 L NS bolus and run at 150 cc/hr. Pt improved during his stay, his CK was trending down, and his kidney function returned to baseline. Stable at discharge   Acute kidney injury Felt 2/2/ rhabdo and trended down with fluids; Cr <1 at d/c  Reperfusion edema Pt denied burn injury upon presentation, but was found to have bullous lesions along with diffuse erythema and edema of his LUE. Wound care was initial consulted and recommended wet dressings. As well, there was a concern for UE  DVT and duplex was done which did not show any clot formation. Vancomycin and Zosyn was added on at one point after BCx had been drawn for concern of superinfection of this wound. However, pt was afebrile, w resolving leukocytosis, and his BCx were negative. Surgery was consulted to r/o compartment syndrome or surgical need, and x-rays of his entire arm were done, showing no fx. They felt this injury was 2/2 falling and with ischemia and reperfusion. Recommended silvadene and elevation and his arm improved during his hospital stay. Vanc/Zosyn was stopped, and he was sent out with requests of wet dressings with silvadene for resolution.  Pt's arm is painful especially with dressing changes. Ordered Lortab 5 mg q6 prn  UTI (lower urinary tract infection) UA pos, Cx neg; pt received 3 days rocephin  Dementia without behavioral disturbance Continue aricept;MMSE this hosp was 20/30.  HYPERTENSION, BENIGN SYSTEMIC Pt 's meds stopped while in hosp;HCTZ resatrtes,  lisinopril was not; will add if needed  Anemia, macrocytic B12 was 100 in hosp; po started after injection  Protein-calorie malnutrition, severe Diet plus supplements    Hennie Duos, MD

## 2013-12-17 NOTE — Assessment & Plan Note (Signed)
Pt 's meds stopped while in hosp;HCTZ resatrtes, lisinopril was not; will add if needed

## 2013-12-17 NOTE — Assessment & Plan Note (Signed)
Pt found to be in rhabdo upon admission. He was initial given a 1 L NS bolus and run at 150 cc/hr. Pt improved during his stay, his CK was trending down, and his kidney function returned to baseline. Stable at discharge

## 2013-12-17 NOTE — Assessment & Plan Note (Signed)
Pt denied burn injury upon presentation, but was found to have bullous lesions along with diffuse erythema and edema of his LUE. Wound care was initial consulted and recommended wet dressings. As well, there was a concern for UE DVT and duplex was done which did not show any clot formation. Vancomycin and Zosyn was added on at one point after BCx had been drawn for concern of superinfection of this wound. However, pt was afebrile, w resolving leukocytosis, and his BCx were negative. Surgery was consulted to r/o compartment syndrome or surgical need, and x-rays of his entire arm were done, showing no fx. They felt this injury was 2/2 falling and with ischemia and reperfusion. Recommended silvadene and elevation and his arm improved during his hospital stay. Vanc/Zosyn was stopped, and he was sent out with requests of wet dressings with silvadene for resolution.  Pt's arm is painful especially with dressing changes. Ordered Lortab 5 mg q6 prn

## 2013-12-17 NOTE — Assessment & Plan Note (Signed)
B12 was 100 in hosp; po started after injection

## 2013-12-19 LAB — CULTURE, BLOOD (ROUTINE X 2)
Culture: NO GROWTH
Culture: NO GROWTH

## 2013-12-21 NOTE — Progress Notes (Signed)
Clinical social worker assisted with patient discharge to skilled nursing facility, [facility].  CSW addressed all family questions and concerns. CSW copied chart and added all important documents. CSW also set up patient transportation with [facility]. Clinical Social Worker will sign off for now as social work intervention is no longer needed.   Jelene Albano, MSW, LCSWA [phone] 

## 2013-12-24 ENCOUNTER — Inpatient Hospital Stay: Payer: Medicare Other | Admitting: Family Medicine

## 2013-12-29 ENCOUNTER — Observation Stay (HOSPITAL_COMMUNITY): Payer: Medicare Other

## 2013-12-29 ENCOUNTER — Observation Stay (HOSPITAL_COMMUNITY)
Admission: EM | Admit: 2013-12-29 | Discharge: 2013-12-30 | Disposition: A | Discharge status: Home / Self Care | Payer: Medicare Other | Attending: Family Medicine | Admitting: Family Medicine

## 2013-12-29 ENCOUNTER — Encounter (HOSPITAL_COMMUNITY): Payer: Self-pay | Admitting: Emergency Medicine

## 2013-12-29 DIAGNOSIS — F0390 Unspecified dementia without behavioral disturbance: Secondary | ICD-10-CM

## 2013-12-29 DIAGNOSIS — S8990XA Unspecified injury of unspecified lower leg, initial encounter: Secondary | ICD-10-CM | POA: Insufficient documentation

## 2013-12-29 DIAGNOSIS — Y939 Activity, unspecified: Secondary | ICD-10-CM | POA: Insufficient documentation

## 2013-12-29 DIAGNOSIS — Y929 Unspecified place or not applicable: Secondary | ICD-10-CM | POA: Insufficient documentation

## 2013-12-29 DIAGNOSIS — S99919A Unspecified injury of unspecified ankle, initial encounter: Secondary | ICD-10-CM

## 2013-12-29 DIAGNOSIS — Z859 Personal history of malignant neoplasm, unspecified: Secondary | ICD-10-CM | POA: Insufficient documentation

## 2013-12-29 DIAGNOSIS — E871 Hypo-osmolality and hyponatremia: Secondary | ICD-10-CM

## 2013-12-29 DIAGNOSIS — R55 Syncope and collapse: Principal | ICD-10-CM | POA: Insufficient documentation

## 2013-12-29 DIAGNOSIS — R5381 Other malaise: Secondary | ICD-10-CM | POA: Insufficient documentation

## 2013-12-29 DIAGNOSIS — S99929A Unspecified injury of unspecified foot, initial encounter: Secondary | ICD-10-CM

## 2013-12-29 DIAGNOSIS — E860 Dehydration: Secondary | ICD-10-CM

## 2013-12-29 DIAGNOSIS — R5383 Other fatigue: Secondary | ICD-10-CM

## 2013-12-29 DIAGNOSIS — I10 Essential (primary) hypertension: Secondary | ICD-10-CM | POA: Insufficient documentation

## 2013-12-29 DIAGNOSIS — W19XXXA Unspecified fall, initial encounter: Secondary | ICD-10-CM | POA: Insufficient documentation

## 2013-12-29 DIAGNOSIS — G319 Degenerative disease of nervous system, unspecified: Secondary | ICD-10-CM | POA: Insufficient documentation

## 2013-12-29 DIAGNOSIS — Z79899 Other long term (current) drug therapy: Secondary | ICD-10-CM | POA: Insufficient documentation

## 2013-12-29 LAB — CBC WITH DIFFERENTIAL/PLATELET
BASOS PCT: 0 % (ref 0–1)
Basophils Absolute: 0 10*3/uL (ref 0.0–0.1)
Eosinophils Absolute: 0 10*3/uL (ref 0.0–0.7)
Eosinophils Relative: 0 % (ref 0–5)
HEMATOCRIT: 39 % (ref 39.0–52.0)
Hemoglobin: 14.1 g/dL (ref 13.0–17.0)
Lymphocytes Relative: 10 % — ABNORMAL LOW (ref 12–46)
Lymphs Abs: 1.3 10*3/uL (ref 0.7–4.0)
MCH: 34.6 pg — ABNORMAL HIGH (ref 26.0–34.0)
MCHC: 36.2 g/dL — AB (ref 30.0–36.0)
MCV: 95.8 fL (ref 78.0–100.0)
MONO ABS: 1.2 10*3/uL — AB (ref 0.1–1.0)
MONOS PCT: 9 % (ref 3–12)
Neutro Abs: 10.6 10*3/uL — ABNORMAL HIGH (ref 1.7–7.7)
Neutrophils Relative %: 81 % — ABNORMAL HIGH (ref 43–77)
Platelets: 353 10*3/uL (ref 150–400)
RBC: 4.07 MIL/uL — ABNORMAL LOW (ref 4.22–5.81)
RDW: 13 % (ref 11.5–15.5)
WBC: 13.2 10*3/uL — ABNORMAL HIGH (ref 4.0–10.5)

## 2013-12-29 LAB — URINALYSIS, ROUTINE W REFLEX MICROSCOPIC
Glucose, UA: NEGATIVE mg/dL
HGB URINE DIPSTICK: NEGATIVE
Ketones, ur: NEGATIVE mg/dL
Nitrite: NEGATIVE
PH: 5 (ref 5.0–8.0)
Protein, ur: NEGATIVE mg/dL
SPECIFIC GRAVITY, URINE: 1.019 (ref 1.005–1.030)
Urobilinogen, UA: 1 mg/dL (ref 0.0–1.0)

## 2013-12-29 LAB — COMPREHENSIVE METABOLIC PANEL
ALT: 20 U/L (ref 0–53)
AST: 26 U/L (ref 0–37)
Albumin: 3.6 g/dL (ref 3.5–5.2)
Alkaline Phosphatase: 97 U/L (ref 39–117)
BUN: 33 mg/dL — AB (ref 6–23)
CO2: 23 meq/L (ref 19–32)
CREATININE: 1.25 mg/dL (ref 0.50–1.35)
Calcium: 9.9 mg/dL (ref 8.4–10.5)
Chloride: 88 mEq/L — ABNORMAL LOW (ref 96–112)
GFR calc non Af Amer: 53 mL/min — ABNORMAL LOW (ref >90)
GFR, EST AFRICAN AMERICAN: 61 mL/min — AB (ref >90)
Glucose, Bld: 119 mg/dL — ABNORMAL HIGH (ref 70–99)
Potassium: 4.8 mEq/L (ref 3.7–5.3)
Sodium: 127 mEq/L — ABNORMAL LOW (ref 137–147)
Total Bilirubin: 0.5 mg/dL (ref 0.3–1.2)
Total Protein: 7.9 g/dL (ref 6.0–8.3)

## 2013-12-29 LAB — URINE MICROSCOPIC-ADD ON

## 2013-12-29 LAB — CBG MONITORING, ED: GLUCOSE-CAPILLARY: 103 mg/dL — AB (ref 70–99)

## 2013-12-29 LAB — TROPONIN I: Troponin I: <0.3 ng/mL (ref <0.30)

## 2013-12-29 MED ORDER — ONDANSETRON HCL 4 MG PO TABS: 4.0000 mg | ORAL_TABLET | Freq: Four times a day (QID) | ORAL | Status: DC | PRN

## 2013-12-29 MED ORDER — HYDRALAZINE HCL 20 MG/ML IJ SOLN: 5.0000 mg | INTRAMUSCULAR | Status: DC | PRN

## 2013-12-29 MED ORDER — SIMVASTATIN 10 MG PO TABS
10.0000 mg | ORAL_TABLET | Freq: Every day | ORAL | Status: DC
  Administered 2013-12-30: 10 mg via ORAL
  Filled 2013-12-29: qty 1

## 2013-12-29 MED ORDER — SODIUM CHLORIDE 0.9 % IJ SOLN: 3.0000 mL | Freq: Two times a day (BID) | INTRAMUSCULAR | Status: DC

## 2013-12-29 MED ORDER — SODIUM CHLORIDE 0.9 % IV SOLN
INTRAVENOUS | Status: DC
  Administered 2013-12-29: 17:00:00 via INTRAVENOUS

## 2013-12-29 MED ORDER — SODIUM CHLORIDE 0.9 % IV SOLN
INTRAVENOUS | Status: DC
  Administered 2013-12-29 – 2013-12-30 (×3): 150 mL/h via INTRAVENOUS

## 2013-12-29 MED ORDER — ACETAMINOPHEN 650 MG RE SUPP: 650.0000 mg | Freq: Four times a day (QID) | RECTAL | Status: DC | PRN

## 2013-12-29 MED ORDER — HYDROCODONE-ACETAMINOPHEN 5-325 MG PO TABS: 1.0000 | ORAL_TABLET | Freq: Three times a day (TID) | ORAL | Status: DC | PRN

## 2013-12-29 MED ORDER — ONDANSETRON HCL 4 MG/2ML IJ SOLN: 4.0000 mg | Freq: Four times a day (QID) | INTRAMUSCULAR | Status: DC | PRN

## 2013-12-29 MED ORDER — ACETAMINOPHEN 325 MG PO TABS
650.0000 mg | ORAL_TABLET | Freq: Four times a day (QID) | ORAL | Status: DC | PRN
  Administered 2013-12-30: 650 mg via ORAL
  Filled 2013-12-29: qty 2

## 2013-12-29 MED ORDER — HEPARIN SODIUM (PORCINE) 5000 UNIT/ML IJ SOLN
5000.0000 [IU] | Freq: Three times a day (TID) | INTRAMUSCULAR | Status: DC
  Administered 2013-12-30 (×2): 5000 [IU] via SUBCUTANEOUS
  Filled 2013-12-29 (×5): qty 1

## 2013-12-29 NOTE — H&P (Signed)
[facility] Admission History and Physical Service Pager: [phone]  Patient name: [name] [name] Medical record number: [id number] Date of birth: 10-06-1931 Age: 80 y.o. Gender: male  Primary Care Provider: Rosemarie Ax, MD Consultants: None  Code Status: Full, discussed with Family  Chief Complaint: Fall   Assessment and Plan: [name] [name] is a 80 y.o. male presenting with syncopal episode without fall.  PMH is significant for CKD, HLD, HTN, Prostate CA and dementia.   #Syncope: Differential includes cardiogenic vs neurogenic vs orthostatic hypotension.Recent admission with syncopal episode with negative work up, presumed to be caused by dehydration. Trop negative x1. EKG is unchanged. No telemetry events. Pt dry on admission - Unclear etiology and story leading up to and after event. It appears he received fluids in transfer, IV fluids hanging in ED. Pt stable appearing during ED encounter and did not recall events. Did not appear to hypoglycemic, pt is hyponatremic (127) and hypochloremic (88), which is a change for him from discharge 2 weeks ago. Pt has been on new pain medication and arisep, both of which could have caused possible syncope or over sedation.  - Admitted to telemetry, Funches Attending. VSS.  - cycle trop - EKG PRN for chest pain and scheduled in am  - Hold arisep, loratab every 8 hours only if needed for pain, otherwise tylenol use.   #knee pain (left): No obvious signs of infection or deformity. Knee xray is normal, outside of degenerative changes.  - Tylenol for pain, Lortab for moderate to sever pain only every 8 hour.  - Continue to monitor  #hyponatremia: Likely from dehydration given dry appearance on exam.  - Volume repletion with NS - Continue to monitor with bmp in am  #Dehydration: Appeared dry on exam. VSS. - Orthostatics on admission - Baseline creatinine around 1.0-1.1, on admission up to 1.25 with elevated BUN (33);  pre-renal - I/O - 150cc/hr NS, after bolus  # Leukocytosis (13.2): CXR clear, pt has had reports of cough over the last few days. Possible URI vs. Stress related to event vs hemoconcentration.  - Will monitor closely for fever, signs of sepsis/infection - CBC in am - UA  #Hx of HTN - Hold until BP can tolerate - Hydralazine PRN for elevated pressures   #Hyperlipidemia - Continue home zocor   #Prostate CA, remote hx per chart  FEN/GI: NS @ 150 cc/hr, Regular diet, assistance with ambulation.  Prophylaxis: SQ heparin   Disposition: Telemetry pending evaluation and improvement   History of Present Illness: [name] [name] is a 80 y.o. male presented to ED via EMS from [facility] home after being found "down".  Report from nursing home pt was found unresponsive in a wheelchair, sternal rub was administered, BP 90/72, RR10.  Pt has had recent admission for similar event of being found down at home.  Pt has dementia at baseline and most of the information is provided by the daughter. He has had slow steady decline on aricept for presumed Alzheimer's. Patients only compliant is knee pain (right). He reports he ate breakfast. He does not recall the events that lead up to his syncopal event. He denies current chest pain, shortness of breath, abdominal pain, dysuria or headache. He does admit to a few episodes of diarrhea, but contributes it to the change in diet at the nursing home. Only changes in medications, is the addition of Lortab at last discharge. Daughter states he use to drink quite heavily, but not recently because it is  not available at nursing home.   Review Of Systems: Per HPI  Patient Active Problem List   Diagnosis Date Noted  . Syncope 12/29/2013  . UTI (lower urinary tract infection) 12/17/2013  . Reperfusion edema 12/15/2013  . Vitamin B12 deficiency 12/14/2013  . Protein-calorie malnutrition, severe 12/13/2013  . Rhabdomyolysis 12/12/2013  . Acute kidney injury  12/12/2013  . Anemia, macrocytic 10/20/2010  . Dementia without behavioral disturbance 10/11/2009  . PROSTATE CANCER 09/25/2006  . HYPERCHOLESTEROLEMIA 09/25/2006  . HYPERTENSION, BENIGN SYSTEMIC 09/25/2006  . CHRONIC KIDNEY DISEASE STAGE II (MILD) 09/25/2006  . OSTEOARTHRITIS, MULTI SITES 09/25/2006   Past Medical History: Past Medical History  Diagnosis Date  . Hypertension   . Cancer    Past Surgical History: History reviewed. No pertinent past surgical history. Social History: History  Substance Use Topics  . Smoking status: Never Smoker   . Smokeless tobacco: Not on file  . Alcohol Use: Yes     Comment: Pt binges on alcohol per pt and family   Family History: History reviewed. No pertinent family history. Allergies and Medications: No Known Allergies No current facility-administered medications on file prior to encounter.   Current Outpatient Prescriptions on File Prior to Encounter  Medication Sig Dispense Refill  . hydrochlorothiazide (HYDRODIURIL) 25 MG tablet Take 1 tablet (25 mg total) by mouth daily.  90 tablet  3  . simvastatin (ZOCOR) 10 MG tablet Take 1 tablet (10 mg total) by mouth daily.  90 tablet  3    Objective: BP 154/66  Pulse 88  Temp(Src) 98.4 F (36.9 C) (Oral)  Resp 17  Ht 5\' 11"  (1.803 m)  Wt 152 lb (68.947 kg)  BMI 21.21 kg/m2  SpO2 99% Exam: General: Mild to moderate Dementia. NAD comfortable in bed, no increased WOB, nontoxic in appearance. HEENT: L supraorbital healed laceration, otherwise AT/[place]. Left TM normal in appearance, right TM with cerumen impaction. Septum midline and atraumatic, no erythema or edema. Dry mucous membranes, EOMI B/L, PERRLA, Normocephalic,  + cataracts B/L Cardiovascular: RRR, 1/6 SM appreciated.  Respiratory: Decreased breath sounds bibasilar, no increased WOB,  Abdomen: Soft/NT/ND, NABS, no suprapubic tenderness or CVA tenderness Extremities: +2 pulses B/L LE, no edema B/L Skin: No sacral breakdown visible  on gross exam.  + L healing arm. L medial ankle with skin break. No erythema or drainage.  Neuro: No focal deficits, Awake, alert, oriented to place and person but not time. MS 5/5 UE/LE. PERLA, EOMI.   Labs and Imaging: Results for orders placed during the hospital encounter of 12/29/13 (from the past 24 hour(s))  CBG MONITORING, ED     Status: Abnormal   Collection Time    12/29/13  2:42 PM      Result Value Ref Range   Glucose-Capillary 103 (*) 70 - 99 mg/dL  CBC WITH DIFFERENTIAL     Status: Abnormal   Collection Time    12/29/13  4:00 PM      Result Value Ref Range   WBC 13.2 (*) 4.0 - 10.5 K/uL   RBC 4.07 (*) 4.22 - 5.81 MIL/uL   Hemoglobin 14.1  13.0 - 17.0 g/dL   HCT 39.0  39.0 - 52.0 %   MCV 95.8  78.0 - 100.0 fL   MCH 34.6 (*) 26.0 - 34.0 pg   MCHC 36.2 (*) 30.0 - 36.0 g/dL   RDW 13.0  11.5 - 15.5 %   Platelets 353  150 - 400 K/uL   Neutrophils Relative % 81 (*) 43 -  77 %   Neutro Abs 10.6 (*) 1.7 - 7.7 K/uL   Lymphocytes Relative 10 (*) 12 - 46 %   Lymphs Abs 1.3  0.7 - 4.0 K/uL   Monocytes Relative 9  3 - 12 %   Monocytes Absolute 1.2 (*) 0.1 - 1.0 K/uL   Eosinophils Relative 0  0 - 5 %   Eosinophils Absolute 0.0  0.0 - 0.7 K/uL   Basophils Relative 0  0 - 1 %   Basophils Absolute 0.0  0.0 - 0.1 K/uL  COMPREHENSIVE METABOLIC PANEL     Status: Abnormal   Collection Time    12/29/13  4:00 PM      Result Value Ref Range   Sodium 127 (*) 137 - 147 mEq/L   Potassium 4.8  3.7 - 5.3 mEq/L   Chloride 88 (*) 96 - 112 mEq/L   CO2 23  19 - 32 mEq/L   Glucose, Bld 119 (*) 70 - 99 mg/dL   BUN 33 (*) 6 - 23 mg/dL   Creatinine, Ser 1.25  0.50 - 1.35 mg/dL   Calcium 9.9  8.4 - 10.5 mg/dL   Total Protein 7.9  6.0 - 8.3 g/dL   Albumin 3.6  3.5 - 5.2 g/dL   AST 26  0 - 37 U/L   ALT 20  0 - 53 U/L   Alkaline Phosphatase 97  39 - 117 U/L   Total Bilirubin 0.5  0.3 - 1.2 mg/dL   GFR calc non Af Amer 53 (*) >90 mL/min   GFR calc Af Amer 61 (*) >90 mL/min  TROPONIN I      Status: None   Collection Time    12/29/13  4:00 PM      Result Value Ref Range   Troponin I <0.30  <0.30 ng/mL   EKG: Unchanged from prior admit  Dg Chest 2 View  12/29/2013   CLINICAL DATA:  Syncope and subsequent fall  EXAM: CHEST  2 VIEW  COMPARISON:  PA and lateral chest x-ray of Dec 12, 2013  FINDINGS: There is mild pulmonary hypoinflation. Bibasilar increased density is present. There is no pleural effusion or alveolar infiltrate. The cardiac silhouette and pulmonary vascularity are normal. There is mild tortuosity of the descending thoracic aorta. The observed portions of the bony thorax appear intact. There is calcification of the anterior longitudinal ligament of the thoracic spine.  IMPRESSION: Persistently increased lung markings at the bases are consistent with subsegmental atelectasis.   Electronically Signed   By: David  Martinique   On: 12/29/2013 15:42   Dg Knee Complete 4 Views Right  12/29/2013   CLINICAL DATA:  Right knee pain status post fall  EXAM: RIGHT KNEE - COMPLETE 4+ VIEW  COMPARISON:  Dec 17, 2007  FINDINGS: The bones are diffusely osteopenic. The tibial plateaus appear intact. There are degenerative changes involving the medial and lateral and patellofemoral compartments. The proximal fibula is normal. There is no joint effusion. There is calcification of the popliteal artery.  IMPRESSION: There is moderate degenerative change of all 3 joint compartments. No acute fracture is demonstrated. If the patient has significant right knee symptoms, CT scanning may be usefulgiven the diffuse osteopenia which could render detection of subtle fractures difficult.   Electronically Signed   By: David  Martinique   On: 12/29/2013 15:48    Ma Hillock, DO 12/29/2013, 4:51 PM PGY-2, [place] Intern pager: [phone], text pages welcome

## 2013-12-29 NOTE — ED Notes (Signed)
Paged IV team 

## 2013-12-29 NOTE — ED Notes (Signed)
Pt's family aware he will be transporting to 3[facility].

## 2013-12-29 NOTE — ED Notes (Signed)
Admitting MD at bedside.

## 2013-12-29 NOTE — ED Notes (Signed)
Notified RN oc CBG 103

## 2013-12-29 NOTE — ED Notes (Signed)
IV coming to obtain IV

## 2013-12-29 NOTE — ED Notes (Signed)
IV team and phlebotomy at bedside. Pt will go up to 3W after IV access gained

## 2013-12-29 NOTE — ED Notes (Signed)
Pt slumped over in wheelchair at [facility] rehab, staff believed patient needed cpr, so patient was moved to bed and patient was alert at this time.  Ems transferred patient to room A4.  Patient has no complaints at this time and is appropriate.  EKG completed by EMS reported SR.  Gluc 144.

## 2013-12-29 NOTE — ED Provider Notes (Signed)
CSN: [id number]     Arrival date & time 12/29/13  1330 History   First MD Initiated Contact with Patient 12/29/13 1335     Chief Complaint  Patient presents with  . Near Syncope     (Consider location/radiation/quality/duration/timing/severity/associated sxs/prior Treatment) HPI  Patient presents from nursing facility after a syncope event. Patient was found by nursing home staff slumped over, and the patient cannot describe the event. Per report the patient was awake and alert, at baseline after the event. Per EMS the patient was hemodynamically stable in route. The patient himself complains only pain in the knee from a prior fall. He cannot describe any aspect of the possible syncopal events to me.   Past Medical History  Diagnosis Date  . Hypertension   . Cancer    History reviewed. No pertinent past surgical history. History reviewed. No pertinent family history. History  Substance Use Topics  . Smoking status: Never Smoker   . Smokeless tobacco: Not on file  . Alcohol Use: Yes     Comment: Pt binges on alcohol per pt and family    Review of Systems  Constitutional:       Per HPI, otherwise negative  HENT:       Per HPI, otherwise negative  Respiratory:       Per HPI, otherwise negative  Cardiovascular:       Per HPI, otherwise negative  Gastrointestinal: Negative for vomiting.  Endocrine:       Negative aside from HPI  Genitourinary:       Neg aside from HPI   Musculoskeletal:       Per HPI, otherwise negative  Skin: Negative.   Neurological: Positive for syncope and weakness.      Allergies  Review of patient's allergies indicates no known allergies.  Home Medications   Prior to Admission medications   Medication Sig Start Date End Date Taking? Authorizing Provider  donepezil (ARICEPT) 5 MG tablet Take 5 mg by mouth daily.   Yes Historical Provider, MD  guaiFENesin (MUCINEX) 600 MG 12 hr tablet Take 600 mg by mouth 3 (three) times daily.   Yes  Historical Provider, MD  hydrochlorothiazide (HYDRODIURIL) 25 MG tablet Take 1 tablet (25 mg total) by mouth daily. 09/10/13  Yes Rosemarie Ax, MD  HYDROcodone-acetaminophen (LORTAB) 5-325 MG per tablet Take 1 tablet by mouth See admin instructions. Take 1 tablet 3 times daily; may take an additional tablet every 3 hours as needed for pain   Yes Historical Provider, MD  simvastatin (ZOCOR) 10 MG tablet Take 1 tablet (10 mg total) by mouth daily. 09/10/13  Yes Rosemarie Ax, MD  vitamin B-12 (CYANOCOBALAMIN) 1000 MCG tablet Take 1,000 mcg by mouth daily.   Yes Historical Provider, MD   BP 129/60  Pulse 87  Temp(Src) 97 F (36.1 C) (Oral)  Resp 14  SpO2 98% Physical Exam  Nursing note and vitals reviewed. Constitutional: He is oriented to person, place, and time. He appears well-developed. No distress.  HENT:  Head: Normocephalic and atraumatic.  Eyes: Conjunctivae and EOM are normal.  Cardiovascular: Normal rate and regular rhythm.   Pulmonary/Chest: Effort normal. No stridor. No respiratory distress.  Abdominal: He exhibits no distension.  Musculoskeletal: He exhibits no edema.       Right hip: Normal.       Right knee: He exhibits normal range of motion, no swelling, no effusion and no deformity. Tenderness found.       Left knee: Normal.  Legs: Neurological: He is alert and oriented to person, place, and time. He displays atrophy. He displays no tremor. No cranial nerve deficit or sensory deficit. He displays no seizure activity.  Speech is difficult to understand, but the patient seems to answer all questions appropriately, specifically, no facial asymmetry, no droop.  Skin: Skin is warm and dry.  Psychiatric: He has a normal mood and affect. His speech is delayed. He is slowed and withdrawn. Cognition and memory are impaired.    ED Course  Procedures (including critical care time) Labs Review Labs Reviewed  CBG MONITORING, ED - Abnormal; Notable for the following:      Glucose-Capillary 103 (*)    All other components within normal limits  CBC WITH DIFFERENTIAL  COMPREHENSIVE METABOLIC PANEL  TROPONIN I  URINALYSIS, ROUTINE W REFLEX MICROSCOPIC  POCT CBG (FASTING - GLUCOSE)-MANUAL ENTRY    Imaging Review Dg Chest 2 View  12/29/2013   CLINICAL DATA:  Syncope and subsequent fall  EXAM: CHEST  2 VIEW  COMPARISON:  PA and lateral chest x-ray of Dec 12, 2013  FINDINGS: There is mild pulmonary hypoinflation. Bibasilar increased density is present. There is no pleural effusion or alveolar infiltrate. The cardiac silhouette and pulmonary vascularity are normal. There is mild tortuosity of the descending thoracic aorta. The observed portions of the bony thorax appear intact. There is calcification of the anterior longitudinal ligament of the thoracic spine.  IMPRESSION: Persistently increased lung markings at the bases are consistent with subsegmental atelectasis.   Electronically Signed   By: David  Martinique   On: 12/29/2013 15:42     EKG Interpretation   Date/Time:  Wednesday December 29 2013 13:52:11 EDT Ventricular Rate:  84 PR Interval:  176 QRS Duration: 94 QT Interval:  386 QTC Calculation: 456 R Axis:   2 Text Interpretation:  Sinus rhythm Borderline T wave abnormalities Sinus  rhythm T wave abnormality Artifact No significant change since last  tracing Abnormal ekg Confirmed by Carmin Muskrat  MD [phone]) on 12/29/2013  2:40:31 PM     A review of patient's chart demonstrates that approximately 2 weeks ago he was found down in his residence. Patient was transferred to a nursing facility.  Patient's daughter and son in the wall are here, assisting with the history of present illness.   MDM   Final diagnoses:  Syncope    Patient presents from nursing facility after an episode of syncope.  Patient's inability to describe the event, his comorbidities indicate admission for monitoring, management. Patient's evaluation the emergency department was  initially reassuring, though on admission some labs are pending.    Carmin Muskrat, MD 12/29/13 [phone]

## 2013-12-29 NOTE — ED Notes (Signed)
2 RN's unable to obtain IV. Notified MD. Madaline Brilliant with pt to go to Xray for now

## 2013-12-29 NOTE — H&P (Signed)
Attending Addendum  I examined the patient and discussed the assessment and plan with Dr. Raoul Pitch. I have reviewed the note and agree.  Briefly, 80 yo M with period of unresponsiveness at [facility]. No fall or trauma. Taking HCTZ, lortab which could contribute to hypotension or sedation. Also on aricept which could contribute to nausea resulting in poor po intake.   Exam: VSS Alert Dry MMM Lungs clear, S1S2 RRR Abdomen soft No LE edema or calf tenderness  A/P Unresponsive/syncope Admit to tele Monitor vitals signs and orthostatics lying to sitting Cycle troponins Likely d.c HCTZ Consider exelon patch instead of Aricept for treatment of dementia.  Avoid narcotic unless pain uncontrolled with tylenol.  Dispo. Back to SNF once stable.           [name] [name], [place]

## 2013-12-30 DIAGNOSIS — E860 Dehydration: Secondary | ICD-10-CM

## 2013-12-30 LAB — CBC WITH DIFFERENTIAL/PLATELET
BASOS ABS: 0 10*3/uL (ref 0.0–0.1)
BASOS PCT: 0 % (ref 0–1)
Eosinophils Absolute: 0.1 10*3/uL (ref 0.0–0.7)
Eosinophils Relative: 2 % (ref 0–5)
HCT: 32.5 % — ABNORMAL LOW (ref 39.0–52.0)
Hemoglobin: 11.5 g/dL — ABNORMAL LOW (ref 13.0–17.0)
Lymphocytes Relative: 22 % (ref 12–46)
Lymphs Abs: 1.5 10*3/uL (ref 0.7–4.0)
MCH: 34.2 pg — ABNORMAL HIGH (ref 26.0–34.0)
MCHC: 35.4 g/dL (ref 30.0–36.0)
MCV: 96.7 fL (ref 78.0–100.0)
Monocytes Absolute: 1 10*3/uL (ref 0.1–1.0)
Monocytes Relative: 14 % — ABNORMAL HIGH (ref 3–12)
NEUTROS ABS: 4.4 10*3/uL (ref 1.7–7.7)
NEUTROS PCT: 62 % (ref 43–77)
Platelets: 311 10*3/uL (ref 150–400)
RBC: 3.36 MIL/uL — ABNORMAL LOW (ref 4.22–5.81)
RDW: 13.3 % (ref 11.5–15.5)
WBC: 7 10*3/uL (ref 4.0–10.5)

## 2013-12-30 LAB — BASIC METABOLIC PANEL
BUN: 27 mg/dL — ABNORMAL HIGH (ref 6–23)
CO2: 21 mEq/L (ref 19–32)
CREATININE: 0.96 mg/dL (ref 0.50–1.35)
Calcium: 8.9 mg/dL (ref 8.4–10.5)
Chloride: 95 mEq/L — ABNORMAL LOW (ref 96–112)
GFR calc Af Amer: 88 mL/min — ABNORMAL LOW (ref >90)
GFR, EST NON AFRICAN AMERICAN: 76 mL/min — AB (ref >90)
Glucose, Bld: 98 mg/dL (ref 70–99)
POTASSIUM: 4.2 meq/L (ref 3.7–5.3)
Sodium: 129 mEq/L — ABNORMAL LOW (ref 137–147)

## 2013-12-30 LAB — TROPONIN I: Troponin I: <0.3 ng/mL (ref <0.30)

## 2013-12-30 MED ORDER — ACETAMINOPHEN 325 MG PO TABS: 650.0000 mg | ORAL_TABLET | Freq: Two times a day (BID) | ORAL | Status: DC | PRN

## 2013-12-30 MED ORDER — ACETAMINOPHEN 325 MG PO TABS
650.0000 mg | ORAL_TABLET | Freq: Three times a day (TID) | ORAL | Status: DC
  Administered 2013-12-30 (×2): 650 mg via ORAL
  Filled 2013-12-30 (×2): qty 2

## 2013-12-30 MED ORDER — RIVASTIGMINE 4.6 MG/24HR TD PT24: 4.6000 mg | MEDICATED_PATCH | Freq: Every day | TRANSDERMAL | Status: DC

## 2013-12-30 NOTE — Discharge Summary (Signed)
Attending Addendum  Attending Addendum  I examined the patient and discussed the discharge plan with Dr. Bonner Puna. I have reviewed the note and agree.    [name] [name], [place]

## 2013-12-30 NOTE — Progress Notes (Signed)
[facility] and gave report to [place]. All questions were answered. Tye asked for staff to leave the condom catheter on pt for transfer. Pt will be ready for discharge when transport arrives.

## 2013-12-30 NOTE — Progress Notes (Signed)
Clinical Social Work Department BRIEF PSYCHOSOCIAL ASSESSMENT 12/30/2013  Patient:  [name] [name], [name] [name]     Account Number:  [id number]     Admit date:  12/29/2013  Clinical Social Worker:  Adair Laundry  Date/Time:  12/30/2013 11:00 AM  Referred by:  Physician  Date Referred:  12/30/2013 Referred for  SNF Placement   Other Referral:   Interview type:  Patient Other interview type:   Spoke with pt at bedside and pt daughter [name] over the phone    PSYCHOSOCIAL DATA Living Status:  [place] Admitted from facility:  [facility] Level of care:  [place] Primary support name:  Jaden Abreu [phone] Primary support relationship to patient:  CHILD, ADULT Degree of support available:   Pt has good support available    CURRENT CONCERNS Current Concerns  Post-Acute Placement   Other Concerns:    SOCIAL WORK ASSESSMENT / PLAN CSW informed that pt was admitted from facility. CSW spoke with pt who was able to confirm he was admitted from [place]. However, CSW was unable to obtain any further information from pt. When asked if pt was long term or short term at [facility] and if plan was to return to facility at dc, pt unclear in response. Pt asked if he was being discharged today. CSW explained MD would make this decision and CSW or pt nurse would update pt.    CSW called pt daughter [name] and spoke with her regarding pt dc plan. Pt is currently ST at [facility] and wants to return home, but pt daughter and team at [facility] believe pt should possibly become long term SNF. Pt daughter has concerns about pt safety should be return home and expressed fear for what might happen without constant supervision. CSW offered support and understanding. CSW asked if current plan will be for pt to dc back to [place] and at lease finish current rehab. Pt daughter said this would be the plan and they will continue to talk with pt about long term plan. Pt daughter  also requested call from MD to update on pt status. CSW notified pt nurse of this.   Assessment/plan status:  Psychosocial Support/Ongoing Assessment of Needs Other assessment/ plan:   Information/referral to community resources:   None needed    PATIENT'S/FAMILY'S RESPONSE TO PLAN OF CARE: Pt family agreeable for pt to return to SNF at [facility] [place]

## 2013-12-30 NOTE — Discharge Summary (Signed)
[facility] Discharge Summary  Patient name: [name] [name] Medical record number: [id number] Date of birth: Nov 26, 1931 Age: 80 y.o. Gender: male Date of Admission: 12/29/2013  Date of Discharge: 12/30/13 Admitting Physician: Minerva Ends, MD  Primary Care Provider: Rosemarie Ax, MD Consultants: None  Indication for Hospitalization: Dehydration  Discharge Diagnoses/Problem List:  Patient Active Problem List   Diagnosis Date Noted  . Syncope 12/29/2013  . UTI (lower urinary tract infection) 12/17/2013  . Reperfusion edema 12/15/2013  . Vitamin B12 deficiency 12/14/2013  . Protein-calorie malnutrition, severe 12/13/2013  . Rhabdomyolysis 12/12/2013  . Acute kidney injury 12/12/2013  . Anemia, macrocytic 10/20/2010  . Dementia without behavioral disturbance 10/11/2009  . PROSTATE CANCER 09/25/2006  . HYPERCHOLESTEROLEMIA 09/25/2006  . HYPERTENSION, BENIGN SYSTEMIC 09/25/2006  . CHRONIC KIDNEY DISEASE STAGE II (MILD) 09/25/2006  . OSTEOARTHRITIS, MULTI SITES 09/25/2006   Disposition: Return to [facility]  Discharge Condition: Stable, improved  Discharge Exam:  General: Demented but pleasant elderly male in NAD HEENT: L supraorbital healed laceration, otherwise AT/[place]. Left TM normal in appearance, right TM with cerumen impaction. Septum midline and atraumatic, no erythema or edema. Moist mucous membranes, EOMI B/L, PERRLA, + cataracts B/L  Skin: No sacral breakdown visible on gross exam. + L healing arm. L medial ankle with skin break. No erythema or drainage.  Neuro: No focal deficits, Awake, alert, oriented to place and person but not time. MS 5/5 UE/LE. PERLA, EOMI.   Brief Hospital Course:  [name] [name] is a 80 y.o. male presenting with syncopal episode without fall. PMH is significant for CKD, HLD, HTN, Prostate CA and dementia.   Upon presentation his mental status was at baseline. Recent admission with syncopal episode with negative work  up, presumed to be caused by dehydration. Troponins were negative and ECG was unchanged. He has had no telemetric events. He was dehydrated clinically and had hyponatremia, which was thought to be due in part to HCTZ. He had also experienced intolerance of aricept, so this was changed to an exelon patch. After IV fluid rehydration his leukocytosis resolved (no signs of true infection were found), BUN:Cr dropped, and sodium normalized. He is being discharged back to [facility] in improved condition.   Issues for Follow Up:  - F/u BP s/p discontinuing HCTZ - F/u mental status/tolerance of exelon patch (aricept was D/Ced)  Significant Procedures: None  Significant Labs and Imaging:   Recent Labs Lab 12/29/13 1600 12/30/13 0436  WBC 13.2* 7.0  HGB 14.1 11.5*  HCT 39.0 32.5*  PLT 353 311    Recent Labs Lab 12/29/13 1600 12/30/13 0436  NA 127* 129*  K 4.8 4.2  CL 88* 95*  CO2 23 21  GLUCOSE 119* 98  BUN 33* 27*  CREATININE 1.25 0.96  CALCIUM 9.9 8.9  ALKPHOS 97  --   AST 26  --   ALT 20  --   ALBUMIN 3.6  --    Urinalysis    Component Value Date/Time   COLORURINE AMBER* 12/29/2013 2312   APPEARANCEUR CLOUDY* 12/29/2013 2312   LABSPEC 1.019 12/29/2013 2312   PHURINE 5.0 12/29/2013 2312   GLUCOSEU NEGATIVE 12/29/2013 2312   HGBUR NEGATIVE 12/29/2013 2312   BILIRUBINUR SMALL* 12/29/2013 2312   KETONESUR NEGATIVE 12/29/2013 2312   PROTEINUR NEGATIVE 12/29/2013 2312   UROBILINOGEN 1.0 12/29/2013 2312   NITRITE NEGATIVE 12/29/2013 2312   LEUKOCYTESUR LARGE* 12/29/2013 2312     Recent Labs Lab 12/29/13 1600 12/29/13 2308 12/30/13 0436  TROPONINI <  0.30 <0.30 <0.30   Results/Tests Pending at Time of Discharge: None  Discharge Medications:    Medication List    STOP taking these medications       donepezil 5 MG tablet  Commonly known as:  ARICEPT     hydrochlorothiazide 25 MG tablet  Commonly known as:  HYDRODIURIL      TAKE these medications       guaiFENesin 600 MG 12 hr  tablet  Commonly known as:  MUCINEX  Take 600 mg by mouth 3 (three) times daily.     LORTAB 5-325 MG per tablet  Generic drug:  HYDROcodone-acetaminophen  Take 1 tablet by mouth See admin instructions. Take 1 tablet 3 times daily; may take an additional tablet every 3 hours as needed for pain     rivastigmine 4.6 mg/24hr  Commonly known as:  EXELON  Place 1 patch (4.6 mg total) onto the skin daily.     simvastatin 10 MG tablet  Commonly known as:  ZOCOR  Take 1 tablet (10 mg total) by mouth daily.     vitamin B-12 1000 MCG tablet  Commonly known as:  CYANOCOBALAMIN  Take 1,000 mcg by mouth daily.        Discharge Instructions: Please refer to Patient Instructions section of EMR for full details.  Patient was counseled important signs and symptoms that should prompt return to medical care, changes in medications, dietary instructions, activity restrictions, and follow up appointments.   Follow-Up Appointments:     Follow-up Information   Follow up with Rosemarie Ax, MD.   Specialty:  [facility] Medicine   Contact information:   [place] [address] [phone]       Vance Gather, MD 12/30/2013, 12:08 PM PGY-1, [place]

## 2013-12-30 NOTE — Consult Note (Signed)
WOC wound consult note Reason for Consult: Abrasion to left medial malleolus, result of fall at home. Multiple like abrasions to left arm, partial thickness and resolving.  Wound type:Trauma/abrasion Measurement: Left medial malleolus 1.5 cm x 1 cm x 0.1 cm  3 similar abrasions to arm.  All are healing well and require no topical therapy at this time.  Keep skin clean and moist and wounds are resolving.  Wound bed:100% pink and moist.  Evidence of new epithelialization present.  Drainage (amount, consistency, odor) None.  Periwound:Intact, dry.  Dressing procedure/placement/frequency:Keep skin clean and dry.  No other wound care orders at this time.  Will not follow at this time.  Please re-consult if needed.  Domenic Moras RN BSN [place] Pager [phone]

## 2013-12-30 NOTE — Progress Notes (Addendum)
CSW (Clinical Education officer, museum) prepared pt dc packet and placed with shadow chart. CSW arranged non-emergent ambulance transport. Pt, pt family, pt nurse, and facility informed. CSW signing off.   ADDENDUM 2:24pm: CSW received call from facility asking if pt had new auth. CSW under the impression pt was traditional Medicare primary. CSW reviewed chart and pt primary and secondary were switched. CSW has cancelled transport. Awaiting PT/OT eval and then will fax new clinicals to insurance. CSW also called and left voicemail for insurance representative to inquire if pt can be discharged to facility on previous authorization number since he is less than 24 hour observation.  ADDENDUM 3:23pm: CSW received call from Sharion Dove with insurance company. She confirmed pt will not need new authorization to return to facility. CSW spoke with [facility] and informed. They confirmed that with this information pt was okay to dc back. CSW arranged non-emergent ambulance transport. Pt and pt nurse informed. CSW signing of.  [place], [place]

## 2013-12-30 NOTE — Discharge Instructions (Signed)
You were admitted for dehydration and looked a lot better after IV fluids. Your sodium was low, which is probably due to HCTZ, so you should STOP taking HCTZ. You also seemed to be experiencing side effects from aricept, so this should also be STOPPED. In its place, you should start an exelon patch daily. Your daughter, [name], knows you are headed by to [facility] and is making arrangements for you.

## 2013-12-30 NOTE — Evaluation (Addendum)
Physical Therapy Evaluation Patient Details Name: [name] [name] MRN: [id number] DOB: 1932-02-01 Today's Date: 12/30/2013   History of Present Illness  [name] [name] is a 80 y.o. male presenting with syncopal episode without fall. PMH is significant for CKD, HLD, HTN, Prostate CA and dementia.    Pt adm due to the above from SNF. Pt presents with decreased independence with bed mobility and very limited with ability to perform basic transfers secondary to deficits indicated below. Pt does follow commands with increased time and is participatory with therapy. Will benefit from skilled PT to address deficits and prevent contractures. Pt is a high risk for skin breakdown due to decreased ability to move well in bed. Will recommend SNF for post acute rehab at this time.   Clinical Impression      Follow Up Recommendations SNF;Supervision/Assistance - 24 hour    Equipment Recommendations  None recommended by PT    Recommendations for Other Services       Precautions / Restrictions Precautions Precautions: Fall Precaution Comments: pt stating multiple times during session "you know i fell right"  Restrictions Weight Bearing Restrictions: No      Mobility  Bed Mobility Overal bed mobility: Needs Assistance Bed Mobility: Supine to Sit;Sit to Supine;Rolling Rolling: Min assist   Supine to sit: +2 for physical assistance;Max assist;HOB elevated Sit to supine: +2 for physical assistance;Max assist   General bed mobility comments: cues for hand placement and sequencing; pt demo difficulty advancing Rt LE; can advance Lt LE towards EOB; use of chuch to facilitate hip movement towards EOB and elevate trunk; pt with decreased ability to flex at hips and maintains posteriorl lean throught sitting   Transfers                 General transfer comment: unable to assess due to posterior lean and decreased hip flexion   Ambulation/Gait                Stairs             Wheelchair Mobility    Modified Rankin (Stroke Patients Only)       Balance Overall balance assessment: Needs assistance Sitting-balance support: Feet supported;Bilateral upper extremity supported Sitting balance-Leahy Scale: Zero Sitting balance - Comments: posterior lean throughout and decreased hip flexion; pt sat EOB ~5 min  Postural control: Posterior lean                                   Pertinent Vitals/Pain C/o Rt shoulder pain intermittently during session    Home Living Family/patient expects to be discharged to:: Skilled nursing facility                      Prior Function Level of Independence: Needs assistance         Comments: no family present but at this time and level would predict total (A) needed for all ADLs and mobility      Hand Dominance   Dominant Hand: Left    Extremity/Trunk Assessment   Upper Extremity Assessment: Generalized weakness;Difficult to assess due to impaired cognition (decreased ability to flex Rt shoulder due to pain)           Lower Extremity Assessment: Generalized weakness;Difficult to assess due to impaired cognition RLE Deficits / Details: Rt LE weaker vs Lt LE; increased tone with Rt Knee and hip flexion; supine MMT Rt hip grossly  2/5  LLE Deficits / Details: able to perform heel slide independently ; difficulty with LAQ grossly 3-/5   Cervical / Trunk Assessment: Kyphotic  Communication   Communication: HOH  Cognition Arousal/Alertness: Awake/alert Behavior During Therapy: Flat affect Overall Cognitive Status: Impaired/Different from baseline (DX of dementia ) Area of Impairment: Orientation;Following commands;Problem solving Orientation Level: Disoriented to;Situation;Time   Memory: Decreased short-term memory Following Commands: Follows one step commands with increased time     Problem Solving: Slow processing;Difficulty sequencing;Requires verbal cues;Requires tactile  cues General Comments: no family present     General Comments General comments (skin integrity, edema, etc.): pt with + BM; RN notified     Exercises Low Level/ICU Exercises Ankle Circles/Pumps: PROM;Both;10 reps;Supine;Other (comment) (difficulty achieving neutral in Rt LE )      Assessment/Plan    PT Assessment Patient needs continued PT services  PT Diagnosis Difficulty walking;Altered mental status;Generalized weakness   PT Problem List Decreased activity tolerance;Decreased strength;Decreased mobility;Decreased cognition;Decreased safety awareness;Decreased range of motion;Decreased balance;Decreased coordination;Decreased skin integrity  PT Treatment Interventions Therapeutic activities;Therapeutic exercise;Functional mobility training;Patient/family education;Neuromuscular re-education;Balance training;Wheelchair mobility training   PT Goals (Current goals can be found in the Care Plan section) Acute Rehab PT Goals Patient Stated Goal: to did not state  PT Goal Formulation: Patient unable to participate in goal setting Time For Goal Achievement: 01/03/14 Potential to Achieve Goals: Poor    Frequency Min 2X/week   Barriers to discharge        Co-evaluation               End of Session   Activity Tolerance: Patient limited by fatigue Patient left: in bed;with call bell/phone within reach;with nursing/sitter in room;with bed alarm set Nurse Communication: Need for lift equipment;Mobility status;Precautions    Functional Assessment Tool Used: clinical judgement  Functional Limitation: Mobility: Walking and moving around Mobility: Walking and Moving Around Current Status [phone]): 100 percent impaired, limited or restricted Mobility: Walking and Moving Around Goal Status [phone]): 100 percent impaired, limited or restricted Mobility: Walking and Moving Around Discharge Status [phone]): 100 percent impaired, limited or restricted    Time: 1545-1600 PT Time  Calculation (min): 15 min   Charges:   PT Evaluation $Initial PT Evaluation Tier I: 1 Procedure PT Treatments $Therapeutic Activity: 8-22 mins   PT G Codes:   Functional Assessment Tool Used: clinical judgement  Functional Limitation: Mobility: Walking and moving around    [place], [place]  [phone] 12/30/2013, 4:23 PM

## 2013-12-31 ENCOUNTER — Other Ambulatory Visit: Payer: Self-pay | Admitting: *Deleted

## 2013-12-31 MED ORDER — HYDROCODONE-ACETAMINOPHEN 5-325 MG PO TABS: ORAL_TABLET | ORAL | Status: DC

## 2013-12-31 NOTE — Telephone Encounter (Signed)
[facility] 

## 2014-01-21 ENCOUNTER — Non-Acute Institutional Stay (SKILLED_NURSING_FACILITY): Payer: Medicare Other | Admitting: Nurse Practitioner

## 2014-01-21 DIAGNOSIS — D539 Nutritional anemia, unspecified: Secondary | ICD-10-CM

## 2014-01-21 DIAGNOSIS — N182 Chronic kidney disease, stage 2 (mild): Secondary | ICD-10-CM

## 2014-01-21 DIAGNOSIS — E43 Unspecified severe protein-calorie malnutrition: Secondary | ICD-10-CM

## 2014-01-21 DIAGNOSIS — F0390 Unspecified dementia without behavioral disturbance: Secondary | ICD-10-CM

## 2014-01-21 DIAGNOSIS — I10 Essential (primary) hypertension: Secondary | ICD-10-CM

## 2014-01-21 NOTE — Progress Notes (Signed)
Patient ID: [name] [name], male   DOB: [birth year], 80 y.o.   MRN: [id number]    Nursing Home Location:  [facility]   Place of Service: SNF (31)  PCP: Rosemarie Ax, MD  No Known Allergies  Chief Complaint  Patient presents with  . Medical Management of Chronic Issues    HPI:  [name] [name] is a 80 y.o. male PMH is significant for CKD, HLD, HTN, Prostate CA and dementia.  Who was hospitalized due to syncopal episode without fall. Pt had another recent admission with syncopal episode with negative work up, presumed to be caused by dehydration. Troponins were negative and ECG was unchanged. He has had no telemetric events. He was dehydrated clinically and had hyponatremia, which was thought to be due in part to HCTZ. He had also experienced intolerance of aricept, so this was changed to an exelon patch and HCTC was dc'd, has been back at [facility] for about a month and doing well; staff denies any recurrent episodes. Pt working with therapy.   Review of Systems: LIMITED DUE TO DEMENTIA Review of Systems  Constitutional: Negative for malaise/fatigue.  Respiratory: Negative for cough and shortness of breath.   Cardiovascular: Negative for chest pain and leg swelling.  Gastrointestinal: Negative for heartburn, abdominal pain, diarrhea and constipation.  Genitourinary: Negative for dysuria.  Musculoskeletal: Negative for joint pain and myalgias.  Neurological: Negative for dizziness, weakness and headaches.  Psychiatric/Behavioral: Positive for memory loss.     Past Medical History  Diagnosis Date  . Hypertension   . Cancer    No past surgical history on file. Social History:   reports that he has never smoked. He does not have any smokeless tobacco history on file. He reports that he drinks alcohol. He reports that he does not use illicit drugs.  No family history on file.  Medications: Patient's Medications  New Prescriptions   No medications on file    Previous Medications   GUAIFENESIN (MUCINEX) 600 MG 12 HR TABLET    Take 600 mg by mouth 3 (three) times daily.   HYDROCODONE-ACETAMINOPHEN (LORTAB) 5-325 MG PER TABLET    Take one tablet by mouth three times daily; Take one tablet by mouth every 3 hours as needed for pain   RIVASTIGMINE (EXELON) 4.6 MG/24HR    Place 1 patch (4.6 mg total) onto the skin daily.   SIMVASTATIN (ZOCOR) 10 MG TABLET    Take 1 tablet (10 mg total) by mouth daily.   VITAMIN B-12 (CYANOCOBALAMIN) 1000 MCG TABLET    Take 1,000 mcg by mouth daily.  Modified Medications   No medications on file  Discontinued Medications   No medications on file     Physical Exam:  Filed Vitals:   01/21/14 1756  BP: 102/73  Pulse: 78  Temp: 97.4 F (36.3 C)  Resp: 20  Weight: 153 lb (69.4 kg)    GENERAL APPEARANCE: Alert, min conversant. Appropriately groomed. No acute distress.   HEENT: unremarkable.     RESPIRATORY: Breathing is even, unlabored. Lung sounds are clear    CARDIOVASCULAR: Heart RRR no murmurs, rubs or gallops. No peripheral edema.   GASTROINTESTINAL: Abdomen is soft, non-tender, not distended w/ normal bowel sounds GENITOURINARY: Bladder non tender, not distended   MUSCULOSKELETAL: No abnormal joints or musculature NEUROLOGIC: Oriented X1. Cranial nerves 2-12 grossly intact PSYCHIATRIC: Mood and affect appropriate to situation c/w dementia, no behavioral issues      Labs reviewed: Basic Metabolic Panel:  Recent Labs  12/15/13 0655 12/29/13 1600 12/30/13 0436  NA 139 127* 129*  K 4.2 4.8 4.2  CL 108 88* 95*  CO2 23 23 21   GLUCOSE 113* 119* 98  BUN 12 33* 27*  CREATININE 0.87 1.25 0.96  CALCIUM 8.0* 9.9 8.9   Liver Function Tests:  Recent Labs  12/14/13 0555 12/15/13 0655 12/29/13 1600  AST 66* 56* 26  ALT 24 32 20  ALKPHOS 70 61 97  BILITOT 0.5 0.4 0.5  PROT 5.9* 5.0* 7.9  ALBUMIN 2.5* 2.1* 3.6   No results found for this basename: LIPASE, AMYLASE,  in the last 8760 hours No  results found for this basename: AMMONIA,  in the last 8760 hours CBC:  Recent Labs  12/15/13 0655 12/29/13 1600 12/30/13 0436  WBC 7.3 13.2* 7.0  NEUTROABS 5.3 10.6* 4.4  HGB 10.9* 14.1 11.5*  HCT 32.4* 39.0 32.5*  MCV 100.0 95.8 96.7  PLT 211 353 311   Cardiac Enzymes:  Recent Labs  12/12/13 1347  12/13/13 0457 12/29/13 1600 12/29/13 2308 12/30/13 0436  CKTOTAL 5621*  --  4562*  --   --   --   TROPONINI  --   < > <0.30 <0.30 <0.30 <0.30  < > = values in this interval not displayed. BNP: No components found with this basename: POCBNP,  CBG:  Recent Labs  12/15/13 0830 12/29/13 1442  GLUCAP 101* 103*   TSH:  Recent Labs  12/12/13 1940  TSH 4.580*   A1C: No results found for this basename: HGBA1C   Lipid Panel:  Recent Labs  09/10/13 1640  CHOL 197  HDL 37*  LDLCALC 140*  TRIG 99  CHOLHDL 5.3      Assessment/Plan 1. HYPERTENSION, BENIGN SYSTEMIC conts to be stable off medications  2. Dementia without behavioral disturbance Stable, conts on exelon  3. CHRONIC KIDNEY DISEASE STAGE II (MILD) Will follow up bmp at this time  4. Anemia, macrocytic Will follow up cbc  5. Protein-calorie malnutrition, severe -RD following at [facility], supplements added

## 2014-02-12 ENCOUNTER — Emergency Department (HOSPITAL_COMMUNITY): Payer: Medicare Other

## 2014-02-12 ENCOUNTER — Encounter (HOSPITAL_COMMUNITY): Payer: Self-pay | Admitting: Emergency Medicine

## 2014-02-12 ENCOUNTER — Inpatient Hospital Stay (HOSPITAL_COMMUNITY)
Admission: EM | Admit: 2014-02-12 | Discharge: 2014-02-15 | DRG: 871 | Disposition: A | Discharge status: Skilled Nursing Facility | Payer: Medicare Other | Attending: Family Medicine | Admitting: Family Medicine

## 2014-02-12 DIAGNOSIS — E236 Other disorders of pituitary gland: Secondary | ICD-10-CM | POA: Diagnosis present

## 2014-02-12 DIAGNOSIS — R6520 Severe sepsis without septic shock: Secondary | ICD-10-CM | POA: Diagnosis present

## 2014-02-12 DIAGNOSIS — A419 Sepsis, unspecified organism: Principal | ICD-10-CM | POA: Diagnosis present

## 2014-02-12 DIAGNOSIS — N182 Chronic kidney disease, stage 2 (mild): Secondary | ICD-10-CM | POA: Diagnosis present

## 2014-02-12 DIAGNOSIS — F0390 Unspecified dementia without behavioral disturbance: Secondary | ICD-10-CM

## 2014-02-12 DIAGNOSIS — R1311 Dysphagia, oral phase: Secondary | ICD-10-CM | POA: Diagnosis present

## 2014-02-12 DIAGNOSIS — J189 Pneumonia, unspecified organism: Secondary | ICD-10-CM

## 2014-02-12 DIAGNOSIS — Z8546 Personal history of malignant neoplasm of prostate: Secondary | ICD-10-CM | POA: Diagnosis not present

## 2014-02-12 DIAGNOSIS — I10 Essential (primary) hypertension: Secondary | ICD-10-CM

## 2014-02-12 DIAGNOSIS — F0280 Dementia in other diseases classified elsewhere without behavioral disturbance: Secondary | ICD-10-CM | POA: Diagnosis present

## 2014-02-12 DIAGNOSIS — E785 Hyperlipidemia, unspecified: Secondary | ICD-10-CM | POA: Diagnosis present

## 2014-02-12 DIAGNOSIS — I959 Hypotension, unspecified: Secondary | ICD-10-CM

## 2014-02-12 DIAGNOSIS — IMO0002 Reserved for concepts with insufficient information to code with codable children: Secondary | ICD-10-CM | POA: Diagnosis not present

## 2014-02-12 DIAGNOSIS — I129 Hypertensive chronic kidney disease with stage 1 through stage 4 chronic kidney disease, or unspecified chronic kidney disease: Secondary | ICD-10-CM | POA: Diagnosis present

## 2014-02-12 DIAGNOSIS — E872 Acidosis, unspecified: Secondary | ICD-10-CM | POA: Diagnosis present

## 2014-02-12 DIAGNOSIS — R6510 Systemic inflammatory response syndrome (SIRS) of non-infectious origin without acute organ dysfunction: Secondary | ICD-10-CM | POA: Diagnosis present

## 2014-02-12 DIAGNOSIS — R112 Nausea with vomiting, unspecified: Secondary | ICD-10-CM | POA: Diagnosis not present

## 2014-02-12 DIAGNOSIS — G309 Alzheimer's disease, unspecified: Secondary | ICD-10-CM | POA: Diagnosis present

## 2014-02-12 DIAGNOSIS — D539 Nutritional anemia, unspecified: Secondary | ICD-10-CM

## 2014-02-12 HISTORY — DX: Dysphagia, unspecified: R13.10

## 2014-02-12 HISTORY — DX: Unspecified dementia, unspecified severity, without behavioral disturbance, psychotic disturbance, mood disturbance, and anxiety: F03.90

## 2014-02-12 HISTORY — DX: Rhabdomyolysis: M62.82

## 2014-02-12 HISTORY — DX: Unspecified symbolic dysfunctions: R48.9

## 2014-02-12 HISTORY — DX: Other congenital malformations of upper limb(s), including shoulder girdle: Q74.0

## 2014-02-12 HISTORY — DX: Chronic kidney disease, unspecified: N18.9

## 2014-02-12 HISTORY — DX: Unspecified fall, initial encounter: W19.XXXA

## 2014-02-12 LAB — CBC WITH DIFFERENTIAL/PLATELET
BASOS ABS: 0 10*3/uL (ref 0.0–0.1)
BASOS PCT: 0 % (ref 0–1)
Basophils Absolute: 0 10*3/uL (ref 0.0–0.1)
Basophils Relative: 0 % (ref 0–1)
EOS ABS: 0 10*3/uL (ref 0.0–0.7)
EOS PCT: 0 % (ref 0–5)
Eosinophils Absolute: 0 10*3/uL (ref 0.0–0.7)
Eosinophils Relative: 0 % (ref 0–5)
HCT: 45.7 % (ref 39.0–52.0)
HCT: 39 % (ref 39.0–52.0)
HEMOGLOBIN: 15.6 g/dL (ref 13.0–17.0)
Hemoglobin: 13.1 g/dL (ref 13.0–17.0)
LYMPHS ABS: 0.5 10*3/uL — AB (ref 0.7–4.0)
LYMPHS PCT: 16 % (ref 12–46)
Lymphocytes Relative: 12 % (ref 12–46)
Lymphs Abs: 1.1 10*3/uL (ref 0.7–4.0)
MCH: 33.3 pg (ref 26.0–34.0)
MCH: 32.5 pg (ref 26.0–34.0)
MCHC: 34.1 g/dL (ref 30.0–36.0)
MCHC: 33.6 g/dL (ref 30.0–36.0)
MCV: 97.4 fL (ref 78.0–100.0)
MCV: 96.8 fL (ref 78.0–100.0)
Monocytes Absolute: 0.4 10*3/uL (ref 0.1–1.0)
Monocytes Absolute: 1.8 10*3/uL — ABNORMAL HIGH (ref 0.1–1.0)
Monocytes Relative: 12 % (ref 3–12)
Monocytes Relative: 21 % — ABNORMAL HIGH (ref 3–12)
NEUTROS ABS: 2.3 10*3/uL (ref 1.7–7.7)
NEUTROS PCT: 67 % (ref 43–77)
Neutro Abs: 5.9 10*3/uL (ref 1.7–7.7)
Neutrophils Relative %: 72 % (ref 43–77)
PLATELETS: 191 10*3/uL (ref 150–400)
Platelets: 275 10*3/uL (ref 150–400)
RBC: 4.03 MIL/uL — ABNORMAL LOW (ref 4.22–5.81)
RBC: 4.69 MIL/uL (ref 4.22–5.81)
RDW: 12.8 % (ref 11.5–15.5)
RDW: 13 % (ref 11.5–15.5)
WBC: 3.1 10*3/uL — ABNORMAL LOW (ref 4.0–10.5)
WBC: 8.8 10*3/uL (ref 4.0–10.5)

## 2014-02-12 LAB — BASIC METABOLIC PANEL
Anion gap: 15 (ref 5–15)
BUN: 23 mg/dL (ref 6–23)
CO2: 21 mEq/L (ref 19–32)
Calcium: 8.2 mg/dL — ABNORMAL LOW (ref 8.4–10.5)
Chloride: 95 mEq/L — ABNORMAL LOW (ref 96–112)
Creatinine, Ser: 1.12 mg/dL (ref 0.50–1.35)
GFR calc Af Amer: 70 mL/min — ABNORMAL LOW (ref >90)
GFR calc non Af Amer: 60 mL/min — ABNORMAL LOW (ref >90)
Glucose, Bld: 104 mg/dL — ABNORMAL HIGH (ref 70–99)
Potassium: 4.8 mEq/L (ref 3.7–5.3)
Sodium: 131 mEq/L — ABNORMAL LOW (ref 137–147)

## 2014-02-12 LAB — I-STAT CHEM 8, ED
BUN: 23 mg/dL (ref 6–23)
CREATININE: 1.1 mg/dL (ref 0.50–1.35)
Calcium, Ion: 1.1 mmol/L — ABNORMAL LOW (ref 1.13–1.30)
Chloride: 95 mEq/L — ABNORMAL LOW (ref 96–112)
Glucose, Bld: 156 mg/dL — ABNORMAL HIGH (ref 70–99)
HCT: 51 % (ref 39.0–52.0)
HEMOGLOBIN: 17.3 g/dL — AB (ref 13.0–17.0)
Potassium: 4.3 mEq/L (ref 3.7–5.3)
Sodium: 129 mEq/L — ABNORMAL LOW (ref 137–147)
TCO2: 20 mmol/L (ref 0–100)

## 2014-02-12 LAB — URINE MICROSCOPIC-ADD ON

## 2014-02-12 LAB — GLUCOSE, CAPILLARY
GLUCOSE-CAPILLARY: 105 mg/dL — AB (ref 70–99)
GLUCOSE-CAPILLARY: 101 mg/dL — AB (ref 70–99)

## 2014-02-12 LAB — COMPREHENSIVE METABOLIC PANEL
ALT: 30 U/L (ref 0–53)
AST: 37 U/L (ref 0–37)
Albumin: 3.3 g/dL — ABNORMAL LOW (ref 3.5–5.2)
Alkaline Phosphatase: 106 U/L (ref 39–117)
Anion gap: 20 — ABNORMAL HIGH (ref 5–15)
BILIRUBIN TOTAL: 0.9 mg/dL (ref 0.3–1.2)
BUN: 21 mg/dL (ref 6–23)
CHLORIDE: 89 meq/L — AB (ref 96–112)
CO2: 19 meq/L (ref 19–32)
CREATININE: 1.03 mg/dL (ref 0.50–1.35)
Calcium: 9.5 mg/dL (ref 8.4–10.5)
GFR calc Af Amer: 77 mL/min — ABNORMAL LOW (ref >90)
GFR, EST NON AFRICAN AMERICAN: 66 mL/min — AB (ref >90)
GLUCOSE: 155 mg/dL — AB (ref 70–99)
Potassium: 4.5 mEq/L (ref 3.7–5.3)
Sodium: 128 mEq/L — ABNORMAL LOW (ref 137–147)
Total Protein: 7.3 g/dL (ref 6.0–8.3)

## 2014-02-12 LAB — URINALYSIS, ROUTINE W REFLEX MICROSCOPIC
Glucose, UA: NEGATIVE mg/dL
Ketones, ur: 15 mg/dL — AB
Leukocytes, UA: NEGATIVE
Nitrite: NEGATIVE
Protein, ur: NEGATIVE mg/dL
Specific Gravity, Urine: 1.026 (ref 1.005–1.030)
UROBILINOGEN UA: 1 mg/dL (ref 0.0–1.0)
pH: 5 (ref 5.0–8.0)

## 2014-02-12 LAB — MRSA PCR SCREENING: MRSA by PCR: NEGATIVE

## 2014-02-12 LAB — TROPONIN I: Troponin I: <0.3 ng/mL (ref <0.30)

## 2014-02-12 LAB — STREP PNEUMONIAE URINARY ANTIGEN: Strep Pneumo Urinary Antigen: NEGATIVE

## 2014-02-12 LAB — I-STAT CG4 LACTIC ACID, ED: Lactic Acid, Venous: 4.97 mmol/L — ABNORMAL HIGH (ref 0.5–2.2)

## 2014-02-12 LAB — HIV ANTIBODY (ROUTINE TESTING W REFLEX): HIV 1&2 Ab, 4th Generation: NONREACTIVE

## 2014-02-12 MED ORDER — ONDANSETRON 8 MG/NS 50 ML IVPB
8.0000 mg | Freq: Four times a day (QID) | INTRAVENOUS | Status: DC | PRN
  Filled 2014-02-12: qty 8

## 2014-02-12 MED ORDER — VANCOMYCIN HCL IN DEXTROSE 750-5 MG/150ML-% IV SOLN
750.0000 mg | Freq: Two times a day (BID) | INTRAVENOUS | Status: AC
  Administered 2014-02-12 – 2014-02-14 (×6): 750 mg via INTRAVENOUS
  Filled 2014-02-12 (×6): qty 150

## 2014-02-12 MED ORDER — SODIUM CHLORIDE 0.9 % IV BOLUS (SEPSIS)
1000.0000 mL | Freq: Once | INTRAVENOUS | Status: AC
  Administered 2014-02-12: 1000 mL via INTRAVENOUS

## 2014-02-12 MED ORDER — SODIUM CHLORIDE 0.9 % IV SOLN: 250.0000 mL | INTRAVENOUS | Status: DC | PRN

## 2014-02-12 MED ORDER — RIVASTIGMINE 4.6 MG/24HR TD PT24
4.6000 mg | MEDICATED_PATCH | Freq: Every day | TRANSDERMAL | Status: DC
  Administered 2014-02-12 – 2014-02-15 (×4): 4.6 mg via TRANSDERMAL
  Filled 2014-02-12 (×4): qty 1

## 2014-02-12 MED ORDER — SODIUM CHLORIDE 0.9 % IV SOLN
INTRAVENOUS | Status: DC
  Administered 2014-02-12 – 2014-02-13 (×3): via INTRAVENOUS

## 2014-02-12 MED ORDER — ONDANSETRON HCL 4 MG/2ML IJ SOLN
4.0000 mg | Freq: Once | INTRAMUSCULAR | Status: AC
  Administered 2014-02-12: 4 mg via INTRAVENOUS
  Filled 2014-02-12: qty 2

## 2014-02-12 MED ORDER — ONDANSETRON 8 MG PO TBDP: 8.0000 mg | ORAL_TABLET | Freq: Once | ORAL | Status: DC

## 2014-02-12 MED ORDER — IPRATROPIUM-ALBUTEROL 0.5-2.5 (3) MG/3ML IN SOLN: 3.0000 mL | RESPIRATORY_TRACT | Status: DC | PRN

## 2014-02-12 MED ORDER — ENOXAPARIN SODIUM 40 MG/0.4ML ~~LOC~~ SOLN
40.0000 mg | SUBCUTANEOUS | Status: DC
  Administered 2014-02-12 – 2014-02-15 (×4): 40 mg via SUBCUTANEOUS
  Filled 2014-02-12 (×4): qty 0.4

## 2014-02-12 MED ORDER — CEFEPIME HCL 1 G IJ SOLR
1.0000 g | Freq: Three times a day (TID) | INTRAMUSCULAR | Status: AC
  Administered 2014-02-12 – 2014-02-14 (×8): 1 g via INTRAVENOUS
  Filled 2014-02-12 (×11): qty 1

## 2014-02-12 MED ORDER — IPRATROPIUM-ALBUTEROL 0.5-2.5 (3) MG/3ML IN SOLN
3.0000 mL | RESPIRATORY_TRACT | Status: DC
  Filled 2014-02-12: qty 3

## 2014-02-12 MED ORDER — VITAMIN B-12 1000 MCG PO TABS
1000.0000 ug | ORAL_TABLET | Freq: Every day | ORAL | Status: DC
  Administered 2014-02-12 – 2014-02-15 (×4): 1000 ug via ORAL
  Filled 2014-02-12 (×4): qty 1

## 2014-02-12 MED ORDER — HEPARIN SODIUM (PORCINE) 5000 UNIT/ML IJ SOLN: 5000.0000 [IU] | Freq: Three times a day (TID) | INTRAMUSCULAR | Status: DC

## 2014-02-12 MED ORDER — SIMVASTATIN 10 MG PO TABS
10.0000 mg | ORAL_TABLET | Freq: Every day | ORAL | Status: DC
  Administered 2014-02-12 – 2014-02-13 (×2): 10 mg via ORAL
  Filled 2014-02-12 (×4): qty 1

## 2014-02-12 MED ORDER — DEXTROSE 5 % IV SOLN
1.0000 g | Freq: Once | INTRAVENOUS | Status: AC
  Administered 2014-02-12: 1 g via INTRAVENOUS
  Filled 2014-02-12: qty 1

## 2014-02-12 MED ORDER — ONDANSETRON HCL 4 MG/5ML PO SOLN
4.0000 mg | Freq: Four times a day (QID) | ORAL | Status: DC | PRN
  Filled 2014-02-12: qty 5

## 2014-02-12 MED ORDER — VANCOMYCIN HCL IN DEXTROSE 1-5 GM/200ML-% IV SOLN
1000.0000 mg | Freq: Once | INTRAVENOUS | Status: AC
  Administered 2014-02-12: 1000 mg via INTRAVENOUS
  Filled 2014-02-12: qty 200

## 2014-02-12 NOTE — ED Notes (Signed)
Admitting dr. Hulen Skains  To consider ICU b/c bp labile from 85-105 SBP/MAP 55- 68.  Pt. Continues to require oropharyngeal suctioning.

## 2014-02-12 NOTE — Consult Note (Signed)
PULMONARY  / [place]   Name: [name] [name] MRN: [id number] DOB: 04-15-1932    ADMISSION DATE:  02/12/2014 CONSULTATION DATE: 02/12/14  REQUESTING CLINICIAN: Chambliss PRIMARY SERVICE: Family Medicine  CHIEF COMPLAINT:  Hypotension, severe sepsis  BRIEF PATIENT DESCRIPTION: [name] [name] with PMH of CKD II, dementia who lives in a ALF presents with N/V and cough, found to have a LLL infiltrate and hypotension consistent with severe sepsis from PNA.  SIGNIFICANT EVENTS / STUDIES:  7/18: Pt presented to ED, CXR significant for LLL infiltrate  LINES / TUBES: None  CULTURES: Blood cx pending Sputum cx pending  ANTIBIOTICS: Vanc: 7/18--> Cefepime: 7/18-->  HISTORY OF PRESENT ILLNESS:  [name] [name] with PMH of dementia, currently residing in [place] [facility] presents with cc of n/v, hypoxia, and cough, found to have a LLL infiltrate and to be febrile.  History is slightly limited 2/2 pts dementia and may not be reliable but pt endorses nausea over the past 24-48 hours.  Also c/o cough x 2 days, SOB and chills.  No chest pain.  Per EMS reports he was noted to be vomiting and to have congestion while at his nursing facility.  Sats at that time were checked and were notable for SaO2 in low 80's, prompting EMS to be called.  Upon presentation to the ED, he was noted to be febrile, tachycardic and tachypneic.  Blood cx were drawn and broad spectrum Abx started.  He was initially admitted to stepdown under family medicine's care.  However, in the interim between their assessment and his transport to the SDU, he was noted to have copious secretions and persistent hypotension prompting an evaluation by [place] for further management and possible ICU transfer.  At the time of my exam, he is coughing up copious thick green sputum but is awake, alert and conversant.    PAST MEDICAL HISTORY :  Past Medical History  Diagnosis Date  . Hypertension   . Cancer   . Rhabdomyolysis   . Dysphagia   .  Symbolic dysfunction   . Fall   . Buford complex    No past surgical history on file. Prior to Admission medications   Medication Sig Start Date End Date Taking? Authorizing Provider  HYDROcodone-acetaminophen (NORCO/VICODIN) 5-325 MG per tablet Take 1 tablet by mouth every 8 (eight) hours as needed for moderate pain.   Yes Historical Provider, MD  guaiFENesin (MUCINEX) 600 MG 12 hr tablet Take 600 mg by mouth 3 (three) times daily.    Historical Provider, MD  rivastigmine (EXELON) 4.6 mg/24hr Place 1 patch (4.6 mg total) onto the skin daily. 12/30/13   Vance Gather, MD  simvastatin (ZOCOR) 10 MG tablet Take 1 tablet (10 mg total) by mouth daily. 09/10/13   Rosemarie Ax, MD  vitamin B-12 (CYANOCOBALAMIN) 1000 MCG tablet Take 1,000 mcg by mouth daily.    Historical Provider, MD   No Known Allergies  FAMILY HISTORY:  Reviewed.  Noncontributory  SOCIAL HISTORY: Lifelong nonsmoker  REVIEW OF SYSTEMS:  10pts reviewed and neg except HPI but question reliability of hx given h/o dementia.  SUBJECTIVE:   VITAL SIGNS: Temp:  [102.1 F (38.9 C)] 102.1 F (38.9 C) (07/18 0232) Pulse Rate:  [106-124] 113 (07/18 0449) Resp:  [22-35] 27 (07/18 0449) BP: (86-162)/(37-113) 86/41 mmHg (07/18 0449) SpO2:  [76 %-100 %] 92 % (07/18 0449) :   INTAKE / OUTPUT: Intake/Output     07/17 0701 - 07/18 0700   I.V. 1750   Total Intake 1750  Urine 100   Total Output 100   Net +1650         PHYSICAL EXAMINATION: General:  Thin, cachectic, NAD, coughing frequently Neuro:  Oriented to person and year, grossly nonfocal from motor standpoint HEENT:  PERRL, EOMI Neck: Supple, no JVD Cardiovascular:  Tachy, no murmurs Lungs:  Coarse insp crackles in bilateral bases, L>R, no wheezes.  Good air movement.  Normal WOB Abdomen:  +BS, NTTP Musculoskeletal:  MAEW Skin:  No rashes, edema  LABS:  CBC  Recent Labs Lab 02/12/14 0033 02/12/14 0052  WBC 3.1*  --   HGB 15.6 17.3*  HCT 45.7 51.0  PLT  275  --    BMET  Recent Labs Lab 02/12/14 0033 02/12/14 0052  NA 128* 129*  K 4.5 4.3  CL 89* 95*  CO2 19  --   BUN 21 23  CREATININE 1.03 1.10  GLUCOSE 155* 156*   Electrolytes  Recent Labs Lab 02/12/14 0033  CALCIUM 9.5   Sepsis Markers  Recent Labs Lab 02/12/14 0211  LATICACIDVEN 4.97*   Liver Enzymes  Recent Labs Lab 02/12/14 0033  AST 37  ALT 30  ALKPHOS 106  BILITOT 0.9  ALBUMIN 3.3*   Cardiac Enzymes  Recent Labs Lab 02/12/14 0155  TROPONINI <0.30   Glucose No results found for this basename: GLUCAP,  in the last 168 hours  Imaging Dg Chest Portable 1 View  02/12/2014   CLINICAL DATA:  Nausea, vomiting  EXAM: PORTABLE CHEST - 1 VIEW  COMPARISON:  12/29/2013  FINDINGS: There is left lower lobe airspace disease. There is minimal right lower lobe airspace disease. There is a trace left pleural effusion. There is no pneumothorax. Normal cardiomediastinal silhouette. Unremarkable osseous structures.  IMPRESSION: 1. Left lower lobe pneumonia. Minimal right lower lobe airspace disease which may reflect atelectasis versus developing pneumonia.   Electronically Signed   By: Kathreen Devoid   On: 02/12/2014 01:11    EKG: Sinus with rate in 120's.  Slight ST depression in V4-5, II, AVF  CXR: LLL infiltrate  ASSESSMENT / PLAN: 32yom with PMH of CKD II, dementia who lives in a ALF presents with N/V and cough, found to have a LLL infiltrate and hypotension consistent with severe sepsis from PNA.   PULMONARY A: Pt with LLL infiltrate, fever, tachypnea, leukopenia, hypotension.  Consistent with severe sepsis with end organ dysfunction (hypotension, elevated lactate) from PNA.  Difficult to determine whether pt was vomiting and aspirated vs coughing and then having resultant vomiting.  No clear aspiration episode witnessed, however, and per report, pt tolerates regular diet.  At this point, seems most likely to be HCAP.  Pt requiring very frequent suctioning as  well.  P:   O2 as needed for sats>90% Broad Abx with Vanc/ Cefepime Blood and sputum cx pending Suctioning as needed.  Will plan on admit to ICU for now given frequent suctioning needs and soft pressures Urine legionella pending   CARDIOVASCULAR A: Hypotensive and tachycardic but responding to IVF.  Hx and exam c/w severe sepsis as above.  Receiving 4th liter now with MAPS in low 60's.  Pt awake, alert and making some urine indicating decent organ perfusion at this time.  Has some ST depression on EKG.  Suspect this is 2/2 demand  P:   Cont 4th liter Will provide 5th if necessary If BP's downtrend despite this, may have to consider pressors but would like to avoid if possible in this frail, elderly pt Cycle CE's  RENAL A: H/o CKD II with baseline Cr of approx 0.9-1.1, which likely reflects a poorer GFR than one would expect given his age and minimal muscle mass.  Regardless, appears at baseline.    P:   Will monitor for now. IVF as above  GASTROINTESTINAL A: Pt with nausea/ vomiting.  Unclear if nausea/ vomiting is primary event which led to aspiration event or if PNA is driving N/V.  At this point, latter of those two seems more likely.  Will leave pt NPO for now and provide prn zofran for nausea relief.  If nausea persists, will need to consider d/c'ing home Exelon patch but will cont for now.  Pt also appears cachectic on exam so suspect longstanding nutrition issues.  P:   NPO for now Speech c/s for swallow eval, diet recs based on their eval PRN zofran for nausea   HEMATOLOGIC A: Pt appears heme concentrated on initial labs with HG of 15.6  P:   IVF as above  INFECTIOUS A: Severe sepsis 2/2 PNA as above  P:   Vanc/ Cefepime Blood and sputum cx pending   NEUROLOGIC A:  Long standing dementia with unclear but definitely diminished functional status at baseline.  Recently changed from aricept to excelon 2/2 nausea.  P:   Will cont Excelon for now but may have  to d/c if nausea persists  Prophy:  SQH, SCD's GI:  None indicated Dispo: Transfer to ICU Code: Full  TODAY'S SUMMARY: 43yom with PMH of dementia, currently residing in [place] [facility]F presents with cc of n/v, hypoxia, and cough, found to have a LLL infiltrate and to be febrile c/w severe sepsis from PNA.  Initially admitted to family medicine but transferred to ICU given suctioning needs and soft BP's.    I have personally obtained a history, examined the patient, evaluated laboratory and imaging results, formulated the assessment and plan and placed orders.  CRITICAL CARE: The patient is critically ill with multiple organ systems failure and requires high complexity decision making for assessment and support, frequent evaluation and titration of therapies, application of advanced monitoring technologies and extensive interpretation of multiple databases. Critical Care Time devoted to patient care services described in this note is 60 minutes.   Lucrezia Starch, MD Pulmonary and [place] Pager: [phone]   02/12/2014, 6:16 AM

## 2014-02-12 NOTE — ED Notes (Signed)
Repositioned blood pressure cuff. Continue to re-evaluate bp.

## 2014-02-12 NOTE — Progress Notes (Signed)
ANTIBIOTIC CONSULT NOTE - INITIAL  Pharmacy Consult for vancomycin and cefepime Indication: pneumonia  No Known Allergies  Patient Measurements:   Adjusted Body Weight: ~70kg  Vital Signs: Temp: 98 F (36.7 C) (07/18 0752) Temp src: Oral (07/18 0752) BP: 95/53 mmHg (07/18 0800) Pulse Rate: 110 (07/18 0800) Intake/Output from previous day: 07/17 0701 - 07/18 0700 In: 1750 [I.V.:1750] Out: 100 [Urine:100] Intake/Output from this shift: Total I/O In: 1500 [I.V.:1500] Out: -   Labs:  Recent Labs  02/12/14 0033 02/12/14 0052  WBC 3.1*  --   HGB 15.6 17.3*  PLT 275  --   CREATININE 1.03 1.10   The CrCl is unknown because both a height and weight (above a minimum accepted value) are required for this calculation. No results found for this basename: VANCOTROUGH, VANCOPEAK, VANCORANDOM, GENTTROUGH, GENTPEAK, GENTRANDOM, TOBRATROUGH, TOBRAPEAK, TOBRARND, AMIKACINPEAK, AMIKACINTROU, AMIKACIN,  in the last 72 hours   Microbiology: No results found for this or any previous visit (from the past 720 hour(s)).  Medical History: Past Medical History  Diagnosis Date  . Hypertension   . Cancer   . Rhabdomyolysis   . Dysphagia   . Symbolic dysfunction   . Fall   . Buford complex     Medications:  Prescriptions prior to admission  Medication Sig Dispense Refill  . HYDROcodone-acetaminophen (NORCO/VICODIN) 5-325 MG per tablet Take 1 tablet by mouth every 8 (eight) hours as needed for moderate pain.      Marland Kitchen guaiFENesin (MUCINEX) 600 MG 12 hr tablet Take 600 mg by mouth 3 (three) times daily.      . rivastigmine (EXELON) 4.6 mg/24hr Place 1 patch (4.6 mg total) onto the skin daily.  30 patch  12  . simvastatin (ZOCOR) 10 MG tablet Take 1 tablet (10 mg total) by mouth daily.  90 tablet  3  . vitamin B-12 (CYANOCOBALAMIN) 1000 MCG tablet Take 1,000 mcg by mouth daily.       Assessment: 80 year old man with severe sepsis from pneumonia, residing at [place]  prior to admission, to receive vancomycin and cefepime per pharmacy protocol.  Goal of Therapy:  Vancomycin trough level 15-20 mcg/ml  Plan:  Measure antibiotic drug levels at steady state Follow up culture results Vancomycin 750mg  IV q12 and cefepime 1g IV q8h Follow renal function and clinical response  Candie Mile 02/12/2014,9:03 AM

## 2014-02-12 NOTE — Progress Notes (Signed)
NAD. No new complaints  Filed Vitals:   02/12/14 1200 02/12/14 1300 02/12/14 1400 02/12/14 1500  BP: 97/46 96/49 114/54 97/56  Pulse: 96 96 103 97  Temp:      TempSrc:      Resp: 24 24 29 26   SpO2: 99% 100% 97% 96%   NAD No JVD noted HEENT WNL L basilar crackles, no wheezes RRR NABS No edema  I have reviewed all of today's lab results. Relevant abnormalities are discussed in the A/P section  CXR: no new film. Admission CXR reviewed  IMP: LLL PNA - Vanc/cefepime day 2 Assisted Living resident Hypotension - resolved  PLAN/REC: -Transfer to SDU and back to [place] CXR AM 7/19 -Cont current abx noting that there is some debate whether residents of assisted living facilities require empiric coverage for HCAP (rather than routine CAP coverage) -If no causative organism identified, would transition to levofloxacin and complete 7-10 days -PCCM will sign off. Please call if we can be of further assistance  Merton Border, MD ; [facility] [phone].  After 5:30 PM or weekends, call [phone]

## 2014-02-12 NOTE — H&P (Signed)
I did not examine [name] [name].  He was admitted to the ICU

## 2014-02-12 NOTE — ED Notes (Signed)
Attempted to call report

## 2014-02-12 NOTE — ED Notes (Signed)
Resp. Therapy did oral suctioning.

## 2014-02-12 NOTE — ED Notes (Signed)
CCM at bedside 

## 2014-02-12 NOTE — ED Notes (Addendum)
~   1300: From [facility]. Staff stated, "n/v earlier."  Now, he cannot spit it up very well and is congested. Pt. Vomiting enroute to hospital - bile/emesis.

## 2014-02-12 NOTE — ED Notes (Signed)
Respiratory Therapy to do oropharyngeal suctioning.

## 2014-02-12 NOTE — ED Provider Notes (Signed)
CSN: [id number]     Arrival date & time 02/12/14  0025 History   First MD Initiated Contact with Patient 02/12/14 0101     Chief Complaint  Patient presents with  . Nausea  . Emesis     (Consider location/radiation/quality/duration/timing/severity/associated sxs/prior Treatment) HPI Comments: 80 yo male sent from his nursing facility secondary to vomiting and congestion.  History from patient is very limited.  He reports coming to the ED because his nursing facility sent him here.  He does state that he feels better now, just cold.  O2 sats in low 80's via EMS, required NRB O2.    Level V Caveat applies. The history is provided by the patient.    Past Medical History  Diagnosis Date  . Hypertension   . Cancer   . Rhabdomyolysis   . Dysphagia   . Symbolic dysfunction   . Fall   . Buford complex    No past surgical history on file. No family history on file. History  Substance Use Topics  . Smoking status: Never Smoker   . Smokeless tobacco: Not on file  . Alcohol Use: Yes     Comment: Pt binges on alcohol per pt and family    Review of Systems  Unable to perform ROS: Dementia      Allergies  Review of patient's allergies indicates no known allergies.  Home Medications   Prior to Admission medications   Medication Sig Start Date End Date Taking? Authorizing Provider  guaiFENesin (MUCINEX) 600 MG 12 hr tablet Take 600 mg by mouth 3 (three) times daily.    Historical Provider, MD  HYDROcodone-acetaminophen (LORTAB) 5-325 MG per tablet Take one tablet by mouth three times daily; Take one tablet by mouth every 3 hours as needed for pain 12/31/13   Pricilla Larsson, NP  rivastigmine (EXELON) 4.6 mg/24hr Place 1 patch (4.6 mg total) onto the skin daily. 12/30/13   Vance Gather, MD  simvastatin (ZOCOR) 10 MG tablet Take 1 tablet (10 mg total) by mouth daily. 09/10/13   Rosemarie Ax, MD  vitamin B-12 (CYANOCOBALAMIN) 1000 MCG tablet Take 1,000 mcg by mouth daily.    Historical  Provider, MD   BP 106/55  Pulse 112  SpO2 100% Physical Exam  Nursing note and vitals reviewed. Constitutional: He is oriented to person, place, and time. He appears well-developed and well-nourished. No distress.  HENT:  Head: Normocephalic and atraumatic.  Mouth/Throat: Oropharynx is clear and moist.  Eyes: Conjunctivae are normal. Pupils are equal, round, and reactive to light. No scleral icterus.  Neck: Neck supple.  Cardiovascular: Normal rate, regular rhythm, normal heart sounds and intact distal pulses.   No murmur heard. Pulmonary/Chest: Breath sounds normal. No stridor. Tachypnea noted. No respiratory distress. He has no wheezes. He has no rales.  Abdominal: Soft. He exhibits no distension. There is no tenderness.  Musculoskeletal: Normal range of motion. He exhibits no edema.  Neurological: He is alert and oriented to person, place, and time.  Skin: Skin is warm and dry. No rash noted.  Psychiatric: He has a normal mood and affect. His behavior is normal.    ED Course  Procedures (including critical care time) Labs Review Labs Reviewed  CBC WITH DIFFERENTIAL - Abnormal; Notable for the following:    WBC 3.1 (*)    Lymphs Abs 0.5 (*)    All other components within normal limits  COMPREHENSIVE METABOLIC PANEL - Abnormal; Notable for the following:    Sodium 128 (*)  Chloride 89 (*)    Glucose, Bld 155 (*)    Albumin 3.3 (*)    GFR calc non Af Amer 66 (*)    GFR calc Af Amer 77 (*)    Anion gap 20 (*)    All other components within normal limits  I-STAT CHEM 8, ED - Abnormal; Notable for the following:    Sodium 129 (*)    Chloride 95 (*)    Glucose, Bld 156 (*)    Calcium, Ion 1.10 (*)    Hemoglobin 17.3 (*)    All other components within normal limits  URINALYSIS, ROUTINE W REFLEX MICROSCOPIC    Imaging Review Dg Chest Portable 1 View  02/12/2014   CLINICAL DATA:  Nausea, vomiting  EXAM: PORTABLE CHEST - 1 VIEW  COMPARISON:  12/29/2013  FINDINGS: There  is left lower lobe airspace disease. There is minimal right lower lobe airspace disease. There is a trace left pleural effusion. There is no pneumothorax. Normal cardiomediastinal silhouette. Unremarkable osseous structures.  IMPRESSION: 1. Left lower lobe pneumonia. Minimal right lower lobe airspace disease which may reflect atelectasis versus developing pneumonia.   Electronically Signed   By: Kathreen Devoid   On: 02/12/2014 01:11  All radiology studies independently viewed by me.      EKG Interpretation None     EKG - sinus tachycardia, rate 122, leftward axis, normal intervals, nonspecific ST/T changes, which are new compared to prior.   MDM   Final diagnoses:  HCAP (healthcare-associated pneumonia)    80 yo male presenting from the nursing home with vomiting and congestion.  Found to be hypoxic via EMS.  Has pneumonia on CXR.  Treated with IV fluids, IV Vanco, IV Cefepime.  Admitted to stepdown unit due to his severe sepsis and O2 requirement.      Houston Siren III, MD 02/12/14 [phone]

## 2014-02-12 NOTE — H&P (Signed)
[facility] Admission History and Physical Service Pager: [phone]  Patient name: [name] [name] Medical record number: [id number] Date of birth: 05-Jun-1932 Age: 80 y.o. Gender: male  Primary Care Provider: Rosemarie Ax, MD Consultants: None Code Status: Full   Chief Complaint: Nausea/Vomiting  Assessment and Plan: [name] [name] is a 80 y.o. male presenting with LLL PNA` . PMH is significant for Dementia, CKD Stage II, Hyperlipidemia   #Severe Sepsis 2/2 HCAP- LLL PNA evident on CXR and Lactic Acid elevated to 4.97, fever, tachypnea and tachycardic.  Clear source for sepsis and his Sx, however, would consider PE and cardiac origin due to his Sx as well if not improving.   - Admit to SDU, vitals per unit - Continue Vanc/Cefipime, repeat NS bolus x 2 - Repeat CBC/BMET in AM.  Can consider Procalcitonin to evaluate for severity  - Sputum Cx, BCx, gram stain, Strep Pneumo urine, legionella urine  - Repeat EKG In AM and will obtain one more troponin due to nausea/vomiting and EKG changes  - Continuous pulse ox, O2 to keep sat > 92%, Incentive Spirometry  - Consult to CSW for placement back at nursing facility eventually ([facility])   #Hx of HTN - Monitor Vitals  #Anion Gap Metabolic Acidosis - 2/2 to his Lactic Acid - Monitor closely, can repeat Lactate as indicated   #Hyperlipidemia - Continue home zocor   #CKD Stage II - Baseline creatinine 1.0-1.1 - Repeat in AM   #Dementia - Most likely Alzheimers - Continue home medications (Previously intolerant of Aricept, switched to [place])   #Hyponatremia - Appears to be new over the past month.  Could be related to SIADH vs dehydration which was the culprit from his last hospitalization  - Continue to monitor   FEN/GI: Soft Diet, NS @ 125 cc/hr Prophylaxis: Lovenox SQ  Disposition: SDU pending further improvement   History of Present Illness: [name] [name] is a 80 y.o. male presenting with  nausea and vomiting from [facility] found to have LLL/HCAP.  Pt have severe dementia and is unable to elaborate on his story other than that he has chills.  He was transported by EMS for low O2's sats from the nursing facility, requiring NRB in transportation.  He denies any Chest pain or pressure but does endorse some productive cough recently.   In the ED, multiple evaluations were performed including Lactic Acid at 5.97, BMET with AG of 20, Na of 128, BCx were drawn, EKG with ST depression in the lateral leads and sinus tachycardia.  CXR was down showing LLL PNA and he was started on Vanc/Cefipime and given a 1 L bolus.   Level 5 Caveat   Review Of Systems: Per HPI  Patient Active Problem List   Diagnosis Date Noted  . Syncope 12/29/2013  . UTI (lower urinary tract infection) 12/17/2013  . Reperfusion edema 12/15/2013  . Vitamin B12 deficiency 12/14/2013  . Protein-calorie malnutrition, severe 12/13/2013  . Rhabdomyolysis 12/12/2013  . Acute kidney injury 12/12/2013  . Anemia, macrocytic 10/20/2010  . Dementia without behavioral disturbance 10/11/2009  . PROSTATE CANCER 09/25/2006  . HYPERCHOLESTEROLEMIA 09/25/2006  . HYPERTENSION, BENIGN SYSTEMIC 09/25/2006  . CHRONIC KIDNEY DISEASE STAGE II (MILD) 09/25/2006  . OSTEOARTHRITIS, MULTI SITES 09/25/2006   Past Medical History: Past Medical History  Diagnosis Date  . Hypertension   . Cancer   . Rhabdomyolysis   . Dysphagia   . Symbolic dysfunction   . Fall   . Buford complex    Past Surgical  History: No past surgical history on file. Social History: History  Substance Use Topics  . Smoking status: Never Smoker   . Smokeless tobacco: Not on file  . Alcohol Use: Yes     Comment: Pt binges on alcohol per pt and family    Family History: No family history on file. Allergies and Medications: No Known Allergies No current facility-administered medications on file prior to encounter.   Current Outpatient Prescriptions  on File Prior to Encounter  Medication Sig Dispense Refill  . guaiFENesin (MUCINEX) 600 MG 12 hr tablet Take 600 mg by mouth 3 (three) times daily.      . rivastigmine (EXELON) 4.6 mg/24hr Place 1 patch (4.6 mg total) onto the skin daily.  30 patch  12  . simvastatin (ZOCOR) 10 MG tablet Take 1 tablet (10 mg total) by mouth daily.  90 tablet  3  . vitamin B-12 (CYANOCOBALAMIN) 1000 MCG tablet Take 1,000 mcg by mouth daily.        Objective: BP 102/88  Pulse 109  Temp(Src) 102.1 F (38.9 C) (Rectal)  Resp 27  SpO2 97% Exam: General: NAD, venti mask in place HEENT: [place]/AT, Dry mucous membranes, PERRLA, EOMI B/L  Neck: No JVD Cardiovascular: Tachycardia, regular rhythm no murmurs appreciated Respiratory: No increased WOB or accessory muscle use, bibasilar crackles L > R, no wheezing  Abdomen: Soft/NT/ND/NABS Extremities: no edema B/L  Skin: No rashes  Neuro: Alert, not oriented to place or time   Labs and Imaging: CBC BMET   Recent Labs Lab 02/12/14 0033 02/12/14 0052  WBC 3.1*  --   HGB 15.6 17.3*  HCT 45.7 51.0  PLT 275  --     Recent Labs Lab 02/12/14 0033 02/12/14 0052  NA 128* 129*  K 4.5 4.3  CL 89* 95*  CO2 19  --   BUN 21 23  CREATININE 1.03 1.10  GLUCOSE 155* 156*  CALCIUM 9.5  --      EKG - Sinus Tachycardia, ST depression in V5/V6 new from previous   CXR 02/12/2014 - LLL PNA    Nolon Rod, DO 02/12/2014, 4:10 AM PGY-3, [place] Intern pager: [phone], text pages welcome

## 2014-02-12 NOTE — ED Notes (Signed)
Cont. To have a productive cough.  At times pt. Needs encouragement to expel it in emesis bag. Moderate amt. Of light green sputum; pt. Does require Resp. Therapy for oropharyngeal suctioning.

## 2014-02-12 NOTE — Progress Notes (Signed)
Report given to RN on [facility] room 10. Daughter [name] also made aware of patients new location.

## 2014-02-12 NOTE — Evaluation (Signed)
Clinical/Bedside Swallow Evaluation Patient Details  Name: Cristin Szatkowski MRN: [id number] Date of Birth: 1931/08/23  Today's Date: 02/12/2014 Time: 1410-1430 SLP Time Calculation (min): 20 min  Past Medical History:  Past Medical History  Diagnosis Date  . Hypertension   . Cancer   . Rhabdomyolysis   . Dysphagia   . Symbolic dysfunction   . Fall   . Buford complex    Past Surgical History: No past surgical history on file. HPI:  80 yo male adm to [facility] with AMS, hypotensive, coughing up secretions - found to have sepsis/left lobe pna.  Pt resides at SNF and reports episodes of vomiting and choking on emesis prior to admission.  Swallow evaluation ordered. .   Assessment / Plan / Recommendation Clinical Impression  Pt presents with cognitive based dysphagia presumed to be exacerbated by lack of dentition and current medical condition.  Pt found with meat retained in mouth while attempting to masticate.  He did follow directions to expectorate per SLP cue.   Pt with overt productive cough while attempting to talk while masticating soft roll  - concerning for aspiration of solid prior to swallow.    Delayed swallow across consistencies up to 10 seconds with puree.  Safety was facilitated by SLP verbally cueing pt to swallow with good pt response.  No indications of airway compromise with puree/thin liquids with full assistance.    Strict compliance to aspiration precautions indicated to decrease aspiration risk due to pt's cognitive based dysphagia.  Recommend puree/thin diet with absolute full supervision.    SLP to follow for family/pt education, readiness for dietary advancement and indication for instrumental evaluation.      Aspiration Risk  Moderate    Diet Recommendation Dysphagia 1 (Puree);Thin liquid   Liquid Administration via: Straw;Cup Medication Administration: Whole meds with puree Supervision: Full supervision/cueing for compensatory strategies Compensations: Slow  rate;Small sips/bites;Check for pocketing (Tell pt to swallow if orally holding) Postural Changes and/or Swallow Maneuvers: Seated upright 90 degrees;Upright 30-60 min after meal    Other  Recommendations Oral Care Recommendations: Oral care BID   Follow Up Recommendations  Skilled Nursing facility    Frequency and Duration min 2x/week  2 weeks   Pertinent Vitals/Pain Low grade temp today, productive cough      Swallow Study Prior Functional Status   resident of SNF    General Date of Onset: 02/12/14 HPI: 80 yo male adm to [facility] with AMS, hypotensive, coughing up secretions - found to have sepsis/left lobe pna.  Pt resides at SNF and reports episodes of vomiting and choking on emesis prior to admission.  Swallow evaluation ordered. . Type of Study: Bedside swallow evaluation Diet Prior to this Study: Dysphagia 3 (soft);Thin liquids Temperature Spikes Noted: Yes Respiratory Status: Nasal cannula (4 liters) History of Recent Intubation: No Behavior/Cognition: Alert;Cooperative;Pleasant mood;Confused;Decreased sustained attention (pt has dementia) Oral Cavity - Dentition: Edentulous (pt reports he needs to get some new dentures) Self-Feeding Abilities: Needs assist Patient Positioning: Upright in bed Baseline Vocal Quality: Clear Volitional Cough: Strong Volitional Swallow: Able to elicit (with delay)    Oral/Motor/Sensory Function Overall Oral Motor/Sensory Function: Appears within functional limits for tasks assessed (no focal CN deficits apparent)   Ice Chips Ice chips: Not tested   Thin Liquid Thin Liquid: Impaired Presentation: Cup;Straw Oral Phase Impairments: Reduced lingual movement/coordination;Reduced labial seal;Impaired anterior to posterior transit Oral Phase Functional Implications: Prolonged oral transit Pharyngeal  Phase Impairments: Suspected delayed Swallow    Nectar Thick Nectar Thick Liquid: Not  tested   Honey Thick Honey Thick Liquid: Not tested   Puree  Puree: Impaired Presentation: Spoon;Self Fed Oral Phase Impairments: Reduced lingual movement/coordination;Impaired anterior to posterior transit Oral Phase Functional Implications: Prolonged oral transit Pharyngeal Phase Impairments: Suspected delayed Swallow   Solid   GO    Solid: Impaired Oral Phase Impairments: Reduced labial seal;Reduced lingual movement/coordination;Impaired anterior to posterior transit;Impaired mastication Oral Phase Functional Implications: Left lateral sulci pocketing;Oral residue;Oral holding Pharyngeal Phase Impairments: Suspected delayed Swallow;Cough - Delayed Other Comments: cough during swallow while pt attempting to speak with boluses in oral cavity, concern for aspiration before the swallow       Claudie Fisherman, [place] [facility] SLP [phone]

## 2014-02-13 ENCOUNTER — Inpatient Hospital Stay (HOSPITAL_COMMUNITY): Payer: Medicare Other

## 2014-02-13 DIAGNOSIS — J189 Pneumonia, unspecified organism: Secondary | ICD-10-CM

## 2014-02-13 LAB — GLUCOSE, CAPILLARY: Glucose-Capillary: 119 mg/dL — ABNORMAL HIGH (ref 70–99)

## 2014-02-13 LAB — CBC
HCT: 33.2 % — ABNORMAL LOW (ref 39.0–52.0)
Hemoglobin: 11.1 g/dL — ABNORMAL LOW (ref 13.0–17.0)
MCH: 32.3 pg (ref 26.0–34.0)
MCHC: 33.4 g/dL (ref 30.0–36.0)
MCV: 96.5 fL (ref 78.0–100.0)
PLATELETS: 196 10*3/uL (ref 150–400)
RBC: 3.44 MIL/uL — ABNORMAL LOW (ref 4.22–5.81)
RDW: 13.1 % (ref 11.5–15.5)
WBC: 9 10*3/uL (ref 4.0–10.5)

## 2014-02-13 LAB — LEGIONELLA ANTIGEN, URINE: LEGIONELLA ANTIGEN, URINE: NEGATIVE

## 2014-02-13 LAB — BASIC METABOLIC PANEL
Anion gap: 15 (ref 5–15)
BUN: 17 mg/dL (ref 6–23)
CO2: 20 mEq/L (ref 19–32)
Calcium: 8 mg/dL — ABNORMAL LOW (ref 8.4–10.5)
Chloride: 96 mEq/L (ref 96–112)
Creatinine, Ser: 0.8 mg/dL (ref 0.50–1.35)
GFR calc non Af Amer: 82 mL/min — ABNORMAL LOW (ref >90)
Glucose, Bld: 99 mg/dL (ref 70–99)
POTASSIUM: 4.2 meq/L (ref 3.7–5.3)
Sodium: 131 mEq/L — ABNORMAL LOW (ref 137–147)

## 2014-02-13 MED ORDER — BIOTENE DRY MOUTH MT LIQD
15.0000 mL | Freq: Two times a day (BID) | OROMUCOSAL | Status: DC
  Administered 2014-02-13 – 2014-02-15 (×4): 15 mL via OROMUCOSAL

## 2014-02-13 NOTE — Progress Notes (Signed)
Family Medicine Teaching Service Attending Note  I interviewed and examined patient [name] [name] and reviewed their tests and x-rays.  I discussed with Dr. Raeford Razor and reviewed their note for today.  I agree with their assessment and plan.     Additionally  Alert oriented to place and person no shortness of breath without O2 Unable to maneuver well in bed with sore left arm  HCPNA - continue iv antibiotics until cultures negative  Consider Physical Therapy consult for mobility

## 2014-02-13 NOTE — Progress Notes (Signed)
Family Medicine Teaching Service Daily Progress Note Intern Pager: [phone]  Patient name: [name] [name] Medical record number: [id number] Date of birth: 02/19/1932 Age: 80 y.o. Gender: male  Primary Care Provider: Rosemarie Ax, MD Consultants: CCM Code Status: Full   Pt Overview and Major Events to Date:  7/18: admitted for severe sepsis 2/2 HCAP,  7/19: transferred from ICU to SDU, CXR showed worsening airspace disease   ABX/Culture Vanc/cefepime (7/17)  Sputum cx (7/18):  Resp cx (7/19):  Blood cx x 2 (7/18):   Assessment and Plan: Keyondre Hepburn is a 80 y.o. male presenting with LLL PNA` . PMH is significant for Dementia, CKD Stage II, Hyperlipidemia   #Severe Sepsis 2/2 HCAP- patient was transferred out of ICU.  - CXR (7/19):  Worsening left lower lobe airspace disease - Continue Vanc/Cefipime; will narrow ABX once cx return negative after 48 hours.  - will transition to levaquin to complete course of 7-10 days.  - Step pneumonia: negative  - HIV: NR  - Sputum Cx, BCx, gram stain,  legionella urine  - Consult to CSW for placement back at nursing facility eventually ([facility])   #Hx of HTN  - Monitor Vitals   #Anion Gap Metabolic Acidosis - 2/2 to his Lactic Acid  - Monitor closely, can repeat Lactate as indicated   #Hyperlipidemia  - Continue home zocor   #CKD Stage II:  Baseline creatinine 1.0-1.1  - improving    #Dementia - Most likely Alzheimers  - Continue home medications (Previously intolerant of Aricept, switched to [place])   #Hyponatremia - resolved.  - Continue to monitor   FEN/GI: Soft Diet, NS @ 125 cc/hr  Prophylaxis: Lovenox SQ  Disposition: transferred from ICU to step down; continue in step down pending improvement.   Subjective:  He has a cough and complaining of right shoulder pain. Otherwise feeling well.   Objective: Temp:  [97 F (36.1 C)-99.4 F (37.4 C)] 99.4 F (37.4 C) (07/19 0700) Pulse Rate:  [90-103] 94 (07/19  0801) Resp:  [20-29] 25 (07/19 0801) BP: (96-126)/(46-62) 126/62 mmHg (07/19 0801) SpO2:  [92 %-100 %] 95 % (07/19 0801) Physical Exam: General: NAD, venti mask in place  HEENT: [place]/AT,   Neck: No JVD  Cardiovascular: regular rate rhythm, no murmurs appreciated  Respiratory: No increased WOB or accessory muscle use, bibasilar crackles L > R, no wheezing  Abdomen: Soft/NT/ND/NABS  Extremities: no edema B/L  Skin: No rashes  Neuro: Alert, not oriented to place or time   Laboratory:  Recent Labs Lab 02/12/14 0033 02/12/14 0052 02/12/14 0924 02/13/14 0225  WBC 3.1*  --  8.8 9.0  HGB 15.6 17.3* 13.1 11.1*  HCT 45.7 51.0 39.0 33.2*  PLT 275  --  191 196    Recent Labs Lab 02/12/14 0033 02/12/14 0052 02/12/14 0924 02/13/14 0225  NA 128* 129* 131* 131*  K 4.5 4.3 4.8 4.2  CL 89* 95* 95* 96  CO2 19  --  21 20  BUN 21 23 23 17   CREATININE 1.03 1.10 1.12 0.80  CALCIUM 9.5  --  8.2* 8.0*  PROT 7.3  --   --   --   BILITOT 0.9  --   --   --   ALKPHOS 106  --   --   --   ALT 30  --   --   --   AST 37  --   --   --   GLUCOSE 155* 156* 104* 99  Recent Labs Lab 02/12/14 0155 02/12/14 0924  TROPONINI <0.30 <0.30    EKG - Sinus Tachycardia, ST depression in V5/V6 new from previous   Imaging/Diagnostic Tests: CXR 02/12/2014 - LLL PNA    Rosemarie Ax, MD 02/13/2014, 9:14 AM PGY-2, [place] Intern pager: [phone], text pages welcome

## 2014-02-13 NOTE — Progress Notes (Signed)
Utilization review completed.  

## 2014-02-14 ENCOUNTER — Encounter (HOSPITAL_COMMUNITY): Payer: Self-pay

## 2014-02-14 DIAGNOSIS — D539 Nutritional anemia, unspecified: Secondary | ICD-10-CM

## 2014-02-14 DIAGNOSIS — N182 Chronic kidney disease, stage 2 (mild): Secondary | ICD-10-CM

## 2014-02-14 DIAGNOSIS — F0390 Unspecified dementia without behavioral disturbance: Secondary | ICD-10-CM

## 2014-02-14 LAB — CULTURE, RESPIRATORY W GRAM STAIN

## 2014-02-14 LAB — CULTURE, RESPIRATORY

## 2014-02-14 MED ORDER — LEVOFLOXACIN 750 MG PO TABS
750.0000 mg | ORAL_TABLET | Freq: Every day | ORAL | Status: DC
  Administered 2014-02-15: 750 mg via ORAL
  Filled 2014-02-14: qty 1

## 2014-02-14 MED ORDER — ENSURE COMPLETE PO LIQD
237.0000 mL | Freq: Two times a day (BID) | ORAL | Status: DC
  Administered 2014-02-14: 237 mL via ORAL

## 2014-02-14 MED ORDER — CHLORHEXIDINE GLUCONATE 0.12 % MT SOLN
15.0000 mL | Freq: Two times a day (BID) | OROMUCOSAL | Status: DC
  Filled 2014-02-14 (×3): qty 15

## 2014-02-14 MED ORDER — ENSURE PUDDING PO PUDG: 1.0000 | Freq: Two times a day (BID) | ORAL | Status: DC

## 2014-02-14 MED ORDER — SODIUM CHLORIDE 0.9 % IV SOLN
INTRAVENOUS | Status: DC
  Administered 2014-02-14 – 2014-02-15 (×2): via INTRAVENOUS

## 2014-02-14 NOTE — Progress Notes (Addendum)
INITIAL NUTRITION ASSESSMENT  DOCUMENTATION CODES Per approved criteria  -Severe malnutrition in the context of chronic illness   INTERVENTION:  Ensure Pudding PO BID, each supplement provides 170 kcal and 4 grams of protein Ensure Complete po BID, each supplement provides 350 kcal and 13 grams of protein  NUTRITION DIAGNOSIS: Malnutrition related to inadequate oral intake as evidenced by severe depletion of muscle and subcutaneous fat mass.   Goal: Intake to meet >90% of estimated nutrition needs.  Monitor:  PO intake, labs, weight trend.  Reason for Assessment: MST  80 y.o. male  Admitting Dx: HCAP (healthcare-associated pneumonia); severe sepsis  ASSESSMENT: 80 y.o. male presenting with LLL PNA. PMH is significant for Dementia, CKD Stage II, Hyperlipidemia.   Patient unable to provide much nutrition history. He appears very thin.   Nutrition Focused Physical Exam:  Subcutaneous Fat:  Orbital Region: severe depletion Upper Arm Region: WNL Thoracic and Lumbar Region: moderate depletion  Muscle:  Temple Region: severe depletion Clavicle Bone Region: severe depletion Clavicle and Acromion Bone Region: severe depletion Scapular Bone Region: moderate depletion Dorsal Hand: WNL Patellar Region: WNL Anterior Thigh Region: WNL Posterior Calf Region: moderate depletion  Edema: none  Pt meets criteria for severe MALNUTRITION in the context of chronic illness as evidenced by severe depletion of muscle and subcutaneous fat mass.  Height: Ht Readings from Last 1 Encounters:  12/29/13 5\' 11"  (1.803 m)    Weight: Wt Readings from Last 1 Encounters:  01/21/14 153 lb (69.4 kg)    Ideal Body Weight: 78.2 kg  % Ideal Body Weight: 89%  Wt Readings from Last 10 Encounters:  01/21/14 153 lb (69.4 kg)  12/30/13 151 lb 12.7 oz (68.854 kg)  12/15/13 163 lb 5.8 oz (74.1 kg)  11/20/11 170 lb (77.111 kg)  06/11/11 165 lb (74.844 kg)  03/05/11 159 lb (72.122 kg)   10/19/10 161 lb (73.029 kg)  08/03/10 152 lb 8 oz (69.174 kg)  07/18/10 155 lb 6.4 oz (70.489 kg)  04/10/10 149 lb (67.586 kg)    Usual Body Weight: 163 lb (2 months ago)  % Usual Body Weight: 94%  BMI:  There is no weight on file to calculate BMI.  Estimated Nutritional Needs: Kcal: 1800-2000 Protein: 90-100 gm Fluid: 1.8 - 2 L  Skin: WDL  Diet Order: Dysphagia 1 with thin liquids  EDUCATION NEEDS: -No education needs identified at this time   Intake/Output Summary (Last 24 hours) at 02/14/14 1304 Last data filed at 02/14/14 0700  Gross per 24 hour  Intake    450 ml  Output   1200 ml  Net   -750 ml    Last BM: 7/18   Labs:   Recent Labs Lab 02/12/14 0033 02/12/14 0052 02/12/14 0924 02/13/14 0225  NA 128* 129* 131* 131*  K 4.5 4.3 4.8 4.2  CL 89* 95* 95* 96  CO2 19  --  21 20  BUN 21 23 23 17   CREATININE 1.03 1.10 1.12 0.80  CALCIUM 9.5  --  8.2* 8.0*  GLUCOSE 155* 156* 104* 99    CBG (last 3)   Recent Labs  02/12/14 0825 02/12/14 1133 02/13/14 2002  GLUCAP 105* 101* 119*    Scheduled Meds: . antiseptic oral rinse  15 mL Mouth Rinse BID  . ceFEPime (MAXIPIME) IV  1 g Intravenous Q8H  . enoxaparin (LOVENOX) injection  40 mg Subcutaneous Q24H  . rivastigmine  4.6 mg Transdermal Daily  . simvastatin  10 mg Oral q1800  .  vancomycin  750 mg Intravenous Q12H  . vitamin B-12  1,000 mcg Oral Daily    Continuous Infusions:   Past Medical History  Diagnosis Date  . Hypertension   . Cancer   . Rhabdomyolysis   . Dysphagia   . Symbolic dysfunction   . Fall   . Buford complex     No past surgical history on file.  Molli Barrows, RD, LDN, [address] Pager [phone] After Hours Pager [phone]

## 2014-02-14 NOTE — Evaluation (Signed)
Physical Therapy Evaluation Patient Details Name: [name] [name] MRN: [id number] DOB: [birth year] Today's Date: 02/14/2014   History of Present Illness  [name] [name] is a 80 y.o. male presenting with nausea and vomiting from [facility] found to have LLL/HCAP. Pt have severe dementia and is unable to elaborate on his story other than that he has chills. He was transported by EMS for low O2's sats from the nursing facility, requiring NRB in transportation. Found to have Severe Sepsis 2/2 HCAP.  Clinical Impression  Patient demonstrates deficits in functional mobility as indicated below. Will need continued skilled PT to address deficits and maximize function. Will see as indicated and progress as tolerated. Recommend return to SNF.    Follow Up Recommendations SNF;Supervision/Assistance - 24 hour    Equipment Recommendations  None recommended by PT    Recommendations for Other Services       Precautions / Restrictions Precautions Precautions: Fall Restrictions Weight Bearing Restrictions: No      Mobility  Bed Mobility Overal bed mobility: Needs Assistance;+2 for physical assistance Bed Mobility: Supine to Sit;Sit to Supine     Supine to sit: Total assist Sit to supine: Total assist      Transfers Overall transfer level: Needs assistance Equipment used: 2 person hand held assist Transfers: Sit to/from Stand Sit to Stand: Max assist;+2 physical assistance         General transfer comment: pt very anxious, could not reach full upright position  Ambulation/Gait                Stairs            Wheelchair Mobility    Modified Rankin (Stroke Patients Only)       Balance Overall balance assessment: Needs assistance;History of Falls Sitting-balance support: Bilateral upper extremity supported;Feet supported Sitting balance-Leahy Scale: Poor   Postural control: Posterior lean;Left lateral lean Standing balance support: Bilateral upper extremity  supported Standing balance-Leahy Scale: Zero                               Pertinent Vitals/Pain SpO2 mid 90s with 2 liters    Home Living Family/patient expects to be discharged to:: Skilled nursing facility                      Prior Function Level of [place]: Needs assistance         Comments: no family present but at this time and level would predict total (A) needed for all ADLs and mobility      Hand Dominance   Dominant Hand: Left    Extremity/Trunk Assessment               Lower Extremity Assessment: Generalized weakness;RLE deficits/detail;LLE deficits/detail;Difficult to assess due to impaired cognition RLE Deficits / Details: limited ROM LLE Deficits / Details: Limited ROM 2/5 gross strength with tested motions  Cervical / Trunk Assessment: Kyphotic  Communication   Communication: HOH  Cognition Arousal/Alertness: Awake/alert Behavior During Therapy: WFL for tasks assessed/performed Overall Cognitive Status: History of cognitive impairments - at baseline                      General Comments      Exercises        Assessment/Plan    PT Assessment Patient needs continued PT services  PT Diagnosis Difficulty walking;Abnormality of gait;Generalized weakness;Acute pain;Altered mental status   PT Problem List Decreased  strength;Decreased range of motion;Decreased activity tolerance;Decreased balance;Decreased mobility;Decreased coordination;Decreased safety awareness;Cardiopulmonary status limiting activity;Pain  PT Treatment Interventions DME instruction;Gait training;Functional mobility training;Therapeutic activities;Therapeutic exercise;Balance training;Patient/family education   PT Goals (Current goals can be found in the Care Plan section) Acute Rehab PT Goals Patient Stated Goal: to walk again PT Goal Formulation: With patient Time For Goal Achievement: 02/28/14 Potential to Achieve Goals: Fair     Frequency Min 2X/week   Barriers to discharge        Co-evaluation               End of Session Equipment Utilized During Treatment: Gait belt;Oxygen Activity Tolerance: Patient limited by fatigue Patient left: in bed;with call bell/phone within reach;with bed alarm set Nurse Communication: Mobility status         Time: 6269-4854 PT Time Calculation (min): 24 min   Charges:   PT Evaluation $Initial PT Evaluation Tier I: 1 Procedure PT Treatments $Therapeutic Activity: 23-37 mins   PT G CodesDuncan Dull 02/14/2014, 6:25 PM Alben Deeds, [place] DPT  [phone]

## 2014-02-14 NOTE — Progress Notes (Signed)
FMTS Attending Daily Note:  Annabell Sabal MD  [phone] pager  Family Practice pager:  [phone] I have seen and examined this patient and have reviewed their chart. I have discussed this patient with the resident. I agree with the resident's findings, assessment and care plan.  Additionally:  Cultures negative.   Improving from pulmonary standpoint.   Attempting to DC back to [facility] for full course abx.    Alveda Reasons, MD 02/14/2014 4:09 PM

## 2014-02-14 NOTE — Progress Notes (Signed)
Family Medicine Teaching Service Daily Progress Note Intern Pager: [phone]  Patient name: [name] [name] Medical record number: [id number] Date of birth: 12-04-1931 Age: 80 y.o. Gender: male  Primary Care Provider: Rosemarie Ax, MD Consultants: CCM Code Status: Full   Pt Overview and Major Events to Date:  7/18: admitted for severe sepsis 2/2 HCAP,  7/19: transferred from ICU to SDU, CXR showed worsening airspace disease   ABX/Culture Vanc/cefepime (7/17)  Sputum cx (7/18):  Resp cx (7/19):  Blood cx x 2 (7/18): NGTD  Assessment and Plan: [name] [name] is a 80 y.o. male presenting with LLL PNA` . PMH is significant for Dementia, CKD Stage II, Hyperlipidemia   #Severe Sepsis 2/2 HCAP- patient was transferred out of ICU to SDU yesterday.  - CXR (7/19):  Worsening left lower lobe airspace disease - On Vanc/Cefipime; blood cx negative >48 hours, therefore will transition to levaquin to complete course of 7-10 days.  - Step pneumonia and legionella urine: negative  - HIV: NR  - Sputum Cx, BCx, gram stain final results pending - Consult to CSW for placement back at nursing facility eventually ([facility])   #Hx of HTN No home medications noted and currently normotensive without intervention - Monitor Vitals   #Anion Gap Metabolic Acidosis - Most likely secondary to his Lactic Acid. AG went from 20-->15 today - May repeat as an outpatient  #Hyperlipidemia  - Continue home zocor   #CKD Stage II:  Baseline creatinine 1.0-1.1  - Cr at 0.8   #Dementia - Most likely Alzheimers  - Continue home medications (Previously intolerant of Aricept, switched to [place])   #Hyponatremia - resolved.  - Continue to monitor   FEN/GI: Soft Diet, NS IV lock Prophylaxis: Lovenox SQ  Disposition: Discharge to [facility] pending improvement in breathing, possibly today.  Subjective:  Feels his SOB and cough is stable. Denies chest pain. No fevers/chills.  Objective: Temp:  [97.8 F  (36.6 C)-99.4 F (37.4 C)] 98.3 F (36.8 C) (07/20 0319) Pulse Rate:  [90-102] 90 (07/20 0319) Resp:  [23-29] 26 (07/20 0319) BP: (120-139)/(62-90) 139/62 mmHg (07/20 0319) SpO2:  [95 %-100 %] 96 % (07/20 0319) Physical Exam: General: NAD, Loud, wet cough on exam  HEENT: [place]/AT, PERRL.Oropharynx clear. MMM  Neck: No JVD  Cardiovascular: regular rate rhythm, no murmurs, rubs, or gallops appreciated  Respiratory: No increased WOB or accessory muscle use, Rhonchous. Bibasilar crackles L > R, no wheezing  Abdomen: Soft/NT/ND/NABS  Extremities: no edema B/L  Skin: No rashes  Neuro: Alert. Not oriented to place or time   Laboratory:  Recent Labs Lab 02/12/14 0033 02/12/14 0052 02/12/14 0924 02/13/14 0225  WBC 3.1*  --  8.8 9.0  HGB 15.6 17.3* 13.1 11.1*  HCT 45.7 51.0 39.0 33.2*  PLT 275  --  191 196    Recent Labs Lab 02/12/14 0033 02/12/14 0052 02/12/14 0924 02/13/14 0225  NA 128* 129* 131* 131*  K 4.5 4.3 4.8 4.2  CL 89* 95* 95* 96  CO2 19  --  21 20  BUN 21 23 23 17   CREATININE 1.03 1.10 1.12 0.80  CALCIUM 9.5  --  8.2* 8.0*  PROT 7.3  --   --   --   BILITOT 0.9  --   --   --   ALKPHOS 106  --   --   --   ALT 30  --   --   --   AST 37  --   --   --  GLUCOSE 155* 156* 104* 99      Recent Labs Lab 02/12/14 0155 02/12/14 0924  TROPONINI <0.30 <0.30    EKG - Sinus Tachycardia, ST depression in V5/V6 new from previous   Imaging/Diagnostic Tests: CXR 02/12/2014 - LLL PNA   CXR 7/19/15Worsening left lower lobe airspace disease. Small left pleural effusion cannot be excluded on this portable exam.   Archie Patten, MD 02/14/2014, 6:39 AM PGY-1, [place] Intern pager: [phone], text pages welcome

## 2014-02-14 NOTE — Discharge Summary (Signed)
[facility] Discharge Summary  Patient name: [name] [name] Medical record number: [id number] Date of birth: 1932-04-28 Age: 80 y.o. Gender: male Date of Admission: 02/12/2014  Date of Discharge: 02/14/2014  Admitting Physician: Thad Ranger, MD  Primary Care Provider: Rosemarie Ax, MD Consultants: CCM  Indication for Hospitalization: Lower left pneumonia  Discharge Diagnoses/Problem List:  Healthcare associated pneumonia with sepsis  Hyperlipidemia  CKD stage II Dementia Hyponatremia  History of hypertension   Disposition: Discharge back to [facility]   Discharge Condition: Stable  Discharge Exam:  Filed Vitals:   02/15/14 1110  BP: 124/56  Pulse: 85  Temp: 97.9 F (36.6 C)  Resp: 27   General: NAD, Loud, wet cough on exam  HEENT: [place]/AT, PERRL.Oropharynx clear. MMM  Neck: No JVD  Cardiovascular: regular rate rhythm, no murmurs, rubs, or gallops appreciated  Respiratory: No increased WOB or accessory muscle use, Rhonchous. Bibasilar crackles. No wheezing noted. On RA when I saw pt Abdomen: Soft/NT/ND/NABS  Extremities: no edema B/L  Skin: No rashes  Neuro: Alert. Not oriented to place or time    Brief Hospital Course:  Patient presented with NV and cough found to have a LLL infiltrate and hypotension consistent with severe sepsis from PNA. Due to copious amounts of secretions and persistent hypotension, he was transferred to the ICU. BP responded to IV boluses. He was started on Vancomycin/cefepmine on 7/18 and transferred out of the ICU at that time. After blood cultures were negative x 48 hours, he was transitioned to Levaquin PO on 7/21.   Additionally, nursing noted some concerns for aspiration on 7/20. Speech re-evaluated and a modified barium swallow was obtained. Dsyphagia 3 (mechanical soft) diet ordered.  At the time of discharge the pt was breathing well on RA and was afebrile.  Issues for Follow Up:  - Dysphagia 3  (Mechanical Soft);Thin liquid Liquid Administration via: Straw;Cup Medication Administration: Whole meds with liquid. Supervision: Staff to assist with self feeding  Compensations: Slow rate;Small sips/bites  Postural Changes and/or Swallow Maneuvers: Seated upright 90 degrees  - Repeat CXR to ensure complete resolution of PNA - Consider rechecking a BMET, pt with a resolving hyponatremia on discharge thought to be secondary to dehydration vs SIADH.   Significant Procedures: None  Significant Labs and Imaging:   Recent Labs Lab 02/12/14 0033 02/12/14 0052 02/12/14 0924 02/13/14 0225  WBC 3.1*  --  8.8 9.0  HGB 15.6 17.3* 13.1 11.1*  HCT 45.7 51.0 39.0 33.2*  PLT 275  --  191 196    Recent Labs Lab 02/12/14 0033 02/12/14 0052 02/12/14 0924 02/13/14 0225  NA 128* 129* 131* 131*  K 4.5 4.3 4.8 4.2  CL 89* 95* 95* 96  CO2 19  --  21 20  GLUCOSE 155* 156* 104* 99  BUN 21 23 23 17   CREATININE 1.03 1.10 1.12 0.80  CALCIUM 9.5  --  8.2* 8.0*  ALKPHOS 106  --   --   --   AST 37  --   --   --   ALT 30  --   --   --   ALBUMIN 3.3*  --   --   --     EKG - Sinus Tachycardia, ST depression in V5/V6 new from previous   CXR 02/12/2014 - LLL PNA   CXR 7/19/15Worsening left lower lobe airspace disease. Small left pleural effusion cannot be excluded on this portable exam.  Modified barium swallow:   Results/Tests Pending at Time of  Discharge:  Blood culture final results Sputum culture final results   Discharge Medications:    Medication List         guaiFENesin 600 MG 12 hr tablet  Commonly known as:  MUCINEX  Take 600 mg by mouth 3 (three) times daily.     HYDROcodone-acetaminophen 5-325 MG per tablet  Commonly known as:  NORCO/VICODIN  Take 1 tablet by mouth every 8 (eight) hours as needed for moderate pain.     levofloxacin 750 MG tablet  Commonly known as:  LEVAQUIN  Take 1 tablet (750 mg total) by mouth daily.     rivastigmine 4.6 mg/24hr  Commonly known as:   EXELON  Place 1 patch (4.6 mg total) onto the skin daily.     simvastatin 10 MG tablet  Commonly known as:  ZOCOR  Take 1 tablet (10 mg total) by mouth daily.     vitamin B-12 1000 MCG tablet  Commonly known as:  CYANOCOBALAMIN  Take 1,000 mcg by mouth daily.        Discharge Instructions: Please refer to Patient Instructions section of EMR for full details.  Patient was counseled important signs and symptoms that should prompt return to medical care, changes in medications, dietary instructions, activity restrictions, and follow up appointments.   Follow-Up Appointments: Follow-up Information   Follow up with With the doctor at [facility].      Archie Patten, MD 02/15/2014, 1:17 PM PGY-1, [place]

## 2014-02-14 NOTE — Progress Notes (Signed)
Speech Language Pathology Treatment: Dysphagia  Patient Details Name: Esley Brooking MRN: [id number] DOB: [birth year] Today's Date: 02/14/2014 Time: 5465-6812 SLP Time Calculation (min): 25 min  Assessment / Plan / Recommendation Clinical Impression  Skilled dysphagia treatment today for observation of POs/ compensatory strategy training. Pt is demonstrating s/s of aspiration at bedside today- wet vocal quality following thin and nectar-thick liquids. Immediate cough x2 with thin liquids; pt needed cues to continue coughing as voice quality continued to sound wet, and cough sounded very congested. Wet vocal quality continued with nectar thick liquid trials- unclear whether this was from previous aspiration or new silent aspiration. Across consistencies pt had inconsistent delay in swallow trigger (suspected) ranging from about 2-8 seconds. Given these findings, recommend that pt be NPO with meds via alternative means, further recommendations pending objective evaluation, hopefully tomorrow a.m. ST will continue to follow.     HPI HPI: 80 yo male adm to [facility] with AMS, hypotensive, coughing up secretions - found to have sepsis/left lobe pna.  Pt resides at SNF and reports episodes of vomiting and choking on emesis prior to admission.  Swallow evaluation ordered. .   Pertinent Vitals n/a  SLP Plan  MBS    Recommendations Diet recommendations: NPO Medication Administration: Via alternative means              Oral Care Recommendations: Oral care Q4 per protocol Follow up Recommendations: Skilled Nursing facility Plan: [place], Sorah Falkenstein K, MA, CCC-SLP 02/14/2014, 4:30 PM

## 2014-02-15 ENCOUNTER — Inpatient Hospital Stay (HOSPITAL_COMMUNITY): Payer: Medicare Other

## 2014-02-15 MED ORDER — HYDROCODONE-ACETAMINOPHEN 5-325 MG PO TABS: 1.0000 | ORAL_TABLET | Freq: Three times a day (TID) | ORAL | Status: DC | PRN

## 2014-02-15 MED ORDER — LEVOFLOXACIN 750 MG PO TABS: 750.0000 mg | ORAL_TABLET | Freq: Every day | ORAL | Status: DC

## 2014-02-15 NOTE — Progress Notes (Signed)
Patient discharged to [facility] via Ambulance.

## 2014-02-15 NOTE — Discharge Instructions (Signed)
You can in due to shortness of breath and was found to have a pneumonia. Continue antiobiotics until they are finished Follow up with your PCP at [facility] Pneumonia, Adult Pneumonia is an infection of the lungs.  CAUSES Pneumonia may be caused by bacteria or a virus. Usually, these infections are caused by breathing infectious particles into the lungs (respiratory tract). SYMPTOMS   Cough.  Fever.  Chest pain.  Increased rate of breathing.  Wheezing.  Mucus production. DIAGNOSIS  If you have the common symptoms of pneumonia, your caregiver will typically confirm the diagnosis with a chest X-ray. The X-ray will show an abnormality in the lung (pulmonary infiltrate) if you have pneumonia. Other tests of your blood, urine, or sputum may be done to find the specific cause of your pneumonia. Your caregiver may also do tests (blood gases or pulse oximetry) to see how well your lungs are working. TREATMENT  Some forms of pneumonia may be spread to other people when you cough or sneeze. You may be asked to wear a mask before and during your exam. Pneumonia that is caused by bacteria is treated with antibiotic medicine. Pneumonia that is caused by the influenza virus may be treated with an antiviral medicine. Most other viral infections must run their course. These infections will not respond to antibiotics.  PREVENTION A pneumococcal shot (vaccine) is available to prevent a common bacterial cause of pneumonia. This is usually suggested for:  People over 1 years old.  Patients on chemotherapy.  People with chronic lung problems, such as bronchitis or emphysema.  People with immune system problems. If you are over 65 or have a high risk condition, you may receive the pneumococcal vaccine if you have not received it before. In some countries, a routine influenza vaccine is also recommended. This vaccine can help prevent some cases of pneumonia.You may be offered the influenza vaccine as  part of your care. If you smoke, it is time to quit. You may receive instructions on how to stop smoking. Your caregiver can provide medicines and counseling to help you quit. HOME CARE INSTRUCTIONS   Cough suppressants may be used if you are losing too much rest. However, coughing protects you by clearing your lungs. You should avoid using cough suppressants if you can.  Your caregiver may have prescribed medicine if he or she thinks your pneumonia is caused by a bacteria or influenza. Finish your medicine even if you start to feel better.  Your caregiver may also prescribe an expectorant. This loosens the mucus to be coughed up.  Only take over-the-counter or prescription medicines for pain, discomfort, or fever as directed by your caregiver.  Do not smoke. Smoking is a common cause of bronchitis and can contribute to pneumonia. If you are a smoker and continue to smoke, your cough may last several weeks after your pneumonia has cleared.  A cold steam vaporizer or humidifier in your room or home may help loosen mucus.  Coughing is often worse at night. Sleeping in a semi-upright position in a recliner or using a couple pillows under your head will help with this.  Get rest as you feel it is needed. Your body will usually let you know when you need to rest. SEEK IMMEDIATE MEDICAL CARE IF:   Your illness becomes worse. This is especially true if you are elderly or weakened from any other disease.  You cannot control your cough with suppressants and are losing sleep.  You begin coughing up blood.  You  develop pain which is getting worse or is uncontrolled with medicines.  You have a fever.  Any of the symptoms which initially brought you in for treatment are getting worse rather than better.  You develop shortness of breath or chest pain. MAKE SURE YOU:   Understand these instructions.  Will watch your condition.  Will get help right away if you are not doing well or get  worse. Document Released: 07/15/2005 Document Revised: 10/07/2011 Document Reviewed: 10/04/2010 [facility] Patient Information 2015 [place], [place]. This information is not intended to replace advice given to you by your health care provider. Make sure you discuss any questions you have with your health care provider.

## 2014-02-15 NOTE — Clinical Social Work Psychosocial (Signed)
Clinical Social Work Department BRIEF PSYCHOSOCIAL ASSESSMENT 02/15/2014  Patient:  [name] [name],[name] [name]     Account Number:  [id number]     Admit date:  02/12/2014  Clinical Social Worker:  Hubert Azure  Date/Time:  02/15/2014 02:38 PM  Referred by:  Physician  Date Referred:  02/15/2014 Referred for  Other - See comment   Other Referral:   Return to [place] type:  Family Other interview type:   Patient's daughter [name] [name])    PSYCHOSOCIAL DATA Living Status:  FACILITY Admitted from facility:  [facility] Level of care:  [place] Primary support name:  [name] [name] [phone]) Primary support relationship to patient:  CHILD, ADULT Degree of support available:   Good    CURRENT CONCERNS Current Concerns  Post-Acute Placement   Other Concerns:    SOCIAL WORK ASSESSMENT / PLAN CSW contacted patient's daughter [name] [name]) via telephone, as patient has dementia and pleasantly confused. CSW introduced self and explained role. CSW discussed d/c plan with daughter. Per daughter patient has been at [facility] for 2 months. Daughter stated patient fell in his bathroom and was found by neighbors. Per daughter, she believes patient cannot live alone. Patient's daughter is agreeable with patient returning to [place].   Assessment/plan status:  Other - See comment Other assessment/ plan:   CSW to update FL2 for return to [place]. CSW to fax clinicals to [facility] for authorization.   Information/referral to community resources:    PATIENT'S/FAMILY'S RESPONSE TO PLAN OF CARE: Patient's daughter was cooperative and pleasant. Patient's daughter thanked CSW for assisting with d/c plan.   [place], [place] Weekend Clinical Social Worker [phone]

## 2014-02-15 NOTE — Progress Notes (Signed)
CARE MANAGEMENT NOTE 02/15/2014  Patient:  [name],[name]   Account Number:  [id number]  Date Initiated:  02/15/2014  Documentation initiated by:  Marvetta Gibbons  Subjective/Objective Assessment:   Pt admitted with sepsis, PNA     Action/Plan:   PTA pt was at [facility]   Anticipated DC Date:  02/15/2014   Anticipated DC Plan:  [place]  In-house referral  Clinical Social Worker      DC Planning Services  CM consult      Choice offered to / List presented to:             Status of service:  Completed, signed off Medicare Important Message given?  YES (If response is "NO", the following Medicare IM given date fields will be blank) Date Medicare IM given:  02/15/2014 Medicare IM given by:  Marvetta Gibbons Date Additional Medicare IM given:   Additional Medicare IM given by:    Discharge Disposition:  [place]  Per UR Regulation:  Reviewed for med. necessity/level of care/duration of stay  If discussed at [place] of Stay Meetings, dates discussed:    Comments:  02/15/14-  1100- Marvetta Gibbons RN, BSN [phone] pt having a MBS - Medicare IM left at bedside- no family present in room.

## 2014-02-15 NOTE — Clinical Social Work Note (Signed)
Per Md patient ready for DC to [facility]. RN, patient, patient's daughter and facility notified of DC. RN give number for report. DC packet on chart. [facility]bul[facility] transport requested. CSW signing off.  Liz Beach MSW, [place], [place], [phone]

## 2014-02-15 NOTE — Clinical Social Work Note (Signed)
CSW obtained authorization from Cloretta Ned [place], [place]: [id number], for initial 7 days at [facility]. CSW contacted [facility] and spoke with [place]. Per Suanne Marker, she received authorization from [place] and facility can accept patient.   [place], [place] Weekend Clinical Social Worker [phone]

## 2014-02-15 NOTE — Clinical Social Work Note (Signed)
CSW made aware of patient d/c to [facility]. CSW contacted patient's daughter [name]) who stated she was made aware earlier in the day. Patient's daughter is agreeable with d/c plan. CSW contacted Mendel Ryder of [facility], who stated she is currently in the process of obtaining insurance authorization. CSW to follow.  [place], [place] Weekend Clinical Social Worker [phone]

## 2014-02-15 NOTE — Procedures (Addendum)
Objective Swallowing Evaluation: Modified Barium Swallow  Patient Details  Name: [name] [name] MRN: [id number] Date of Birth: 1931-10-18  Today's Date: 02/15/2014 Time: 0950-1020 SLP Time Calculation (min): 30 min  Past Medical History:  Past Medical History  Diagnosis Date  . Hypertension   . Cancer   . Rhabdomyolysis   . Dysphagia   . Symbolic dysfunction   . Fall   . [place] complex   . Dementia   . Chronic kidney disease    Past Surgical History: History reviewed. No pertinent past surgical history. HPI:  80 yo male adm to [facility] with AMS, hypotensive, coughing up secretions - found to have sepsis/left lobe pna.  Pt resides at SNF and reports episodes of vomiting and choking on emesis prior to admission.  Swallow evaluation ordered. .     Assessment / Plan / Recommendation Clinical Impression  Dysphagia Diagnosis: Mild oral phase dysphagia Clinical impression: Pt demonstrates a mild oral dyspahgia with brief oral holding prior to transit of the bolus, possibly an independent compensatory strategy to improve timing of swallow. Otherwise there were no signficant findings. Pt is able to masticate soft solids despite missing dentition, there was no penetration aspiration or significant residuals. Esophageal transit WNL. After the test pt expectorated quite a bit of yellow gree mucous. Pt may initiate a dys 3 (mechanical soft) diet with thin liquids. No SLP f/u needed.     Treatment Recommendation  No treatment recommended at this time    Diet Recommendation Dysphagia 3 (Mechanical Soft);Thin liquid   Liquid Administration via: Straw;Cup Medication Administration: Whole meds with liquid Supervision: Staff to assist with self feeding Compensations: Slow rate;Small sips/bites Postural Changes and/or Swallow Maneuvers: Seated upright 90 degrees    Other  Recommendations Oral Care Recommendations: Oral care BID   Follow Up Recommendations  Skilled Nursing facility    Frequency and  Duration        Pertinent Vitals/Pain NA    SLP Swallow Goals     General HPI: 80 yo male adm to [facility] with AMS, hypotensive, coughing up secretions - found to have sepsis/left lobe pna.  Pt resides at SNF and reports episodes of vomiting and choking on emesis prior to admission.  Swallow evaluation ordered. . Type of Study: Bedside swallow evaluation Reason for Referral: Objectively evaluate swallowing function Diet Prior to this Study: NPO Respiratory Status: Nasal cannula History of Recent Intubation: No Behavior/Cognition: Alert;Cooperative;Requires cueing Oral Cavity - Dentition: Edentulous Oral Motor / Sensory Function: Within functional limits Self-Feeding Abilities: Able to feed self Patient Positioning: Upright in chair Baseline Vocal Quality: Clear Volitional Cough: Strong Volitional Swallow: Able to elicit Anatomy: Within functional limits    Reason for Referral Objectively evaluate swallowing function   Oral Phase Oral Preparation/Oral Phase Oral Phase: Impaired Oral - Thin Oral - Thin Cup: Holding of bolus Oral - Thin Straw: Holding of bolus Oral - Solids Oral - Puree: Holding of bolus Oral - Regular: Holding of bolus Oral - Pill: Holding of bolus   Pharyngeal Phase Pharyngeal Phase Pharyngeal Phase: Within functional limits  Cervical Esophageal Phase    GO    Cervical Esophageal Phase Cervical Esophageal Phase: [facility]        [name] [place], MA CCC-SLP [phone]  [name] [name], Katherene Ponto 02/15/2014, 11:09 AM

## 2014-02-15 NOTE — Progress Notes (Signed)
Family Medicine Teaching Service Daily Progress Note Intern Pager: [phone]  Patient name: [name] [name] Medical record number: [id number] Date of birth: [birth year] Age: 80 y.o. Gender: male  Primary Care Provider: Rosemarie Ax, MD Consultants: CCM Code Status: Full   Pt Overview and Major Events to Date:  7/18: admitted for severe sepsis 2/2 HCAP,  7/19: transferred from ICU to SDU, CXR showed worsening airspace disease  7/21: Transition to PO Levaquin   ABX/Culture Vanc/cefepime (7/18)  Resp cx (7/18): PMNs with rare gram positive cocci in pairs and in clusters. Culture: Non-pathogenic oropharyngeal-type flora isolated Blood cx x 2 (7/18): NGTD  Assessment and Plan: [name] [name] is a 80 y.o. male presenting with LLL PNA` . PMH is significant for Dementia, CKD Stage II, Hyperlipidemia   #Severe Sepsis 2/2 HCAP- patient was transferred out of ICU to SDU yesterday.  - CXR (7/19):  Worsening left lower lobe airspace disease - On Vanc/Cefipime; blood cx negative >48 hours, therefore will transition to levaquin to complete a 10 day course. - Step pneumonia and legionella urine: negative  - HIV: NR  - Sputum Cx, BCx, gram stain final results pending - Consult to CSW for placement back at nursing facility eventually ([facility])   #Hx of HTN No home medications noted and currently normotensive without intervention - Monitor Vitals   #Anion Gap Metabolic Acidosis - Most likely secondary to his Lactic Acid. AG went from 20-->15 on 7.19 - May repeat as an outpatient  #Hyperlipidemia  - Continue home zocor   #CKD Stage II:  Baseline creatinine 1.0-1.1  - Last Cr at 0.8   #Dementia - Most likely Alzheimers  - Continue home medications (Previously intolerant of Aricept, switched to [place])   #Hyponatremia - stable at 131 - Continue to monitor as an outpatient.   FEN/GI: Was previously Dysphagia 1 (puree); thin liquid however repeat modified swallow test today per speech NS  IV lock Prophylaxis: Lovenox SQ  Disposition: Discharge to [facility] pending improvement in breathing, possibly today.  Subjective:  Pt doing well overnight. He tells me he "has shortness of breath because I have pneumonia." However, he does note that it is improving. Cough is also improving. Denies chest pain. Concerns for silent aspiration by RN on 7/20: Speech evaluated and plans to have modified barium swallow today.   Objective: Temp:  [97.9 F (36.6 C)-98.4 F (36.9 C)] 97.9 F (36.6 C) (07/21 0351) Pulse Rate:  [81-98] 81 (07/21 0351) Resp:  [20-23] 20 (07/21 0351) BP: (125-136)/(58-66) 129/58 mmHg (07/21 0351) SpO2:  [95 %-97 %] 95 % (07/21 0351) Weight:  [155 lb 6.8 oz (70.5 kg)] 155 lb 6.8 oz (70.5 kg) (07/21 0030) Physical Exam: General: NAD, Loud, wet cough on exam  HEENT: [place]/AT, PERRL.Oropharynx clear. MMM  Neck: No JVD  Cardiovascular: regular rate rhythm, no murmurs, rubs, or gallops appreciated  Respiratory: No increased WOB or accessory muscle use, Rhonchous. Bibasilar crackles bilaterally, no wheezing  Abdomen: Soft/NT/ND/NABS  Extremities: no edema B/L  Skin: No rashes  Neuro: Alert. Not oriented to place or time   Laboratory:  Recent Labs Lab 02/12/14 0033 02/12/14 0052 02/12/14 0924 02/13/14 0225  WBC 3.1*  --  8.8 9.0  HGB 15.6 17.3* 13.1 11.1*  HCT 45.7 51.0 39.0 33.2*  PLT 275  --  191 196    Recent Labs Lab 02/12/14 0033 02/12/14 0052 02/12/14 0924 02/13/14 0225  NA 128* 129* 131* 131*  K 4.5 4.3 4.8 4.2  CL 89* 95* 95* 96  CO2 19  --  21 20  BUN 21 23 23 17   CREATININE 1.03 1.10 1.12 0.80  CALCIUM 9.5  --  8.2* 8.0*  PROT 7.3  --   --   --   BILITOT 0.9  --   --   --   ALKPHOS 106  --   --   --   ALT 30  --   --   --   AST 37  --   --   --   GLUCOSE 155* 156* 104* 99      Recent Labs Lab 02/12/14 0155 02/12/14 0924  TROPONINI <0.30 <0.30    EKG - Sinus Tachycardia, ST depression in V5/V6 new from previous   CXR  02/12/2014 - LLL PNA   CXR 7/19/15Worsening left lower lobe airspace disease. Small left pleural effusion cannot be excluded on this portable exam.   Archie Patten, MD 02/15/2014, 7:01 AM PGY-1, [place] Intern pager: [phone], text pages welcome

## 2014-02-16 NOTE — Discharge Summary (Signed)
Family Medicine Teaching Service  Discharge Note : Attending Jeff Naelle Diegel MD Pager [phone] Inpatient Team Pager:  [phone]  I have reviewed this patient and the patient's chart and have discussed discharge planning with the resident at the time of discharge. I agree with the discharge plan as above.    

## 2014-02-17 ENCOUNTER — Non-Acute Institutional Stay (SKILLED_NURSING_FACILITY): Payer: Medicare Other | Admitting: Internal Medicine

## 2014-02-17 ENCOUNTER — Encounter: Payer: Self-pay | Admitting: Internal Medicine

## 2014-02-17 DIAGNOSIS — E871 Hypo-osmolality and hyponatremia: Secondary | ICD-10-CM

## 2014-02-17 DIAGNOSIS — R6510 Systemic inflammatory response syndrome (SIRS) of non-infectious origin without acute organ dysfunction: Secondary | ICD-10-CM

## 2014-02-17 DIAGNOSIS — F0390 Unspecified dementia without behavioral disturbance: Secondary | ICD-10-CM

## 2014-02-17 DIAGNOSIS — N182 Chronic kidney disease, stage 2 (mild): Secondary | ICD-10-CM

## 2014-02-17 DIAGNOSIS — R1310 Dysphagia, unspecified: Secondary | ICD-10-CM

## 2014-02-17 DIAGNOSIS — I10 Essential (primary) hypertension: Secondary | ICD-10-CM

## 2014-02-18 LAB — CULTURE, BLOOD (ROUTINE X 2)
Culture: NO GROWTH
Culture: NO GROWTH

## 2014-02-19 ENCOUNTER — Encounter: Payer: Self-pay | Admitting: Internal Medicine

## 2014-02-19 DIAGNOSIS — E871 Hypo-osmolality and hyponatremia: Secondary | ICD-10-CM | POA: Insufficient documentation

## 2014-02-19 NOTE — Assessment & Plan Note (Signed)
Thought 2/2 dehydration; SIADH from PNA? ; recheck

## 2014-02-19 NOTE — Assessment & Plan Note (Signed)
Dysphagia 3-mechanical soft, thin liquids with small bites

## 2014-02-19 NOTE — Assessment & Plan Note (Signed)
Was hypotensive in hosp;on no meds now;will monitor

## 2014-02-19 NOTE — Assessment & Plan Note (Signed)
Cr 0.8

## 2014-02-19 NOTE — Progress Notes (Signed)
MRN: [id number] Name: [name] [name]  Sex: male Age: 80 y.o. DOB: 12/14/1931  [place] #: [facility] Facility/Room: 129B Level Of Care: SNF Provider: Inocencio Homes D Emergency Contacts: Extended Emergency Contact Information Primary Emergency Contact: Gotts,[name] Address: [place], [place] [address] [place] Phone: [phone] Relation: Daughter  Code Status: FULL  Allergies: Review of patient's allergies indicates no known allergies.  Chief Complaint  Patient presents with  . nursing home admission    HPI: Patient is 80 y.o. male who was hospitalized with pna AND sirs.  Past Medical History  Diagnosis Date  . Hypertension   . Cancer   . Rhabdomyolysis   . Dysphagia   . Symbolic dysfunction   . Fall   . Buford complex   . Dementia   . Chronic kidney disease     History reviewed. No pertinent past surgical history.    Medication List       This list is accurate as of: 02/17/14 11:59 PM.  Always use your most recent med list.               guaiFENesin 600 MG 12 hr tablet  Commonly known as:  MUCINEX  Take 600 mg by mouth 3 (three) times daily.     HYDROcodone-acetaminophen 5-325 MG per tablet  Commonly known as:  NORCO/VICODIN  Take 1 tablet by mouth every 8 (eight) hours as needed for moderate pain.     levofloxacin 750 MG tablet  Commonly known as:  LEVAQUIN  Take 1 tablet (750 mg total) by mouth daily.     rivastigmine 4.6 mg/24hr  Commonly known as:  EXELON  Place 1 patch (4.6 mg total) onto the skin daily.     simvastatin 10 MG tablet  Commonly known as:  ZOCOR  Take 1 tablet (10 mg total) by mouth daily.     vitamin B-12 1000 MCG tablet  Commonly known as:  CYANOCOBALAMIN  Take 1,000 mcg by mouth daily.        No orders of the defined types were placed in this encounter.    Immunization History  Administered Date(s) Administered  . Influenza Split 05/08/2011, 05/07/2012  . Influenza Whole  04/22/2008, 05/25/2009  . Pneumococcal Polysaccharide-23 04/10/2010, 12/15/2013  . Td 04/10/2010    History  Substance Use Topics  . Smoking status: Never Smoker   . Smokeless tobacco: Not on file  . Alcohol Use: Yes     Comment: Pt binges on alcohol per pt and family    Family history is noncontributory    Review of Systems  DATA OBTAINED: from patient; no c/o , dementia GENERAL:  no fevers, fatigue, appetite changes SKIN: No itching, rash  EYES: No eye pain, redness, discharge EARS: No earache, tinnitus, change in hearing NOSE: No congestion, drainage or bleeding  MOUTH/THROAT: No mouth or tooth pain RESPIRATORY: No cough, wheezing, SOB CARDIAC: No chest pain, palpitations, lower extremity edema  GI: No abdominal pain, No N/V/D or constipation, No heartburn or reflux  GU: No dysuria, frequency or urgency, or incontinence  MUSCULOSKELETAL: No unrelieved bone/joint pain NEUROLOGIC: No headache, dizziness  PSYCHIATRIC: No overt anxiety or sadness No behavior issue.   Filed Vitals:   02/19/14 2248  BP: 124/70  Pulse: 86  Temp: 97.8 F (36.6 C)  Resp: 19    Physical Exam  GENERAL APPEARANCE: Alert, minconversant. Appropriately groomed. No acute distress.  SKIN: No diaphoresis rash HEAD: Normocephalic,  atraumatic  EYES: Conjunctiva/lids clear. Pupils round, reactive. EOMs intact.  EARS: External exam WNL, canals clear. Hearing grossly normal.  NOSE: No deformity or discharge.  MOUTH/THROAT: Lips w/o lesions RESPIRATORY: Breathing is even, unlabored. Lung sounds are clear   CARDIOVASCULAR: Heart RRR no murmurs, rubs or gallops. No peripheral edema.  GASTROINTESTINAL: Abdomen is soft, non-tender, not distended w/ normal bowel sounds GENITOURINARY: Bladder non tender, not distended  MUSCULOSKELETAL: No abnormal joints or musculature NEUROLOGIC:  Cranial nerves 2-12 grossly intact. Moves all extremities no tremor. PSYCHIATRIC: dementia no behavioral issues  Patient  Active Problem List   Diagnosis Date Noted  . Hyponatremia 02/19/2014  . Dysphagia   . HCAP (healthcare-associated pneumonia) 02/12/2014  . SIRS (systemic inflammatory response syndrome) 02/12/2014  . Severe sepsis 02/12/2014  . Hypotension 02/12/2014  . Vitamin B12 deficiency 12/14/2013  . Protein-calorie malnutrition, severe 12/13/2013  . Anemia, macrocytic 10/20/2010  . Dementia without behavioral disturbance 10/11/2009  . PROSTATE CANCER 09/25/2006  . HYPERCHOLESTEROLEMIA 09/25/2006  . HYPERTENSION, BENIGN SYSTEMIC 09/25/2006  . CHRONIC KIDNEY DISEASE STAGE II (MILD) 09/25/2006  . OSTEOARTHRITIS, MULTI SITES 09/25/2006    CBC    Component Value Date/Time   WBC 9.0 02/13/2014 0225   RBC 3.44* 02/13/2014 0225   HGB 11.1* 02/13/2014 0225   HCT 33.2* 02/13/2014 0225   PLT 196 02/13/2014 0225   MCV 96.5 02/13/2014 0225   LYMPHSABS 1.1 02/12/2014 0924   MONOABS 1.8* 02/12/2014 0924   EOSABS 0.0 02/12/2014 0924   BASOSABS 0.0 02/12/2014 0924    CMP     Component Value Date/Time   NA 131* 02/13/2014 0225   K 4.2 02/13/2014 0225   CL 96 02/13/2014 0225   CO2 20 02/13/2014 0225   GLUCOSE 99 02/13/2014 0225   BUN 17 02/13/2014 0225   CREATININE 0.80 02/13/2014 0225   CREATININE 1.14 09/10/2013 1640   CALCIUM 8.0* 02/13/2014 0225   PROT 7.3 02/12/2014 0033   ALBUMIN 3.3* 02/12/2014 0033   AST 37 02/12/2014 0033   ALT 30 02/12/2014 0033   ALKPHOS 106 02/12/2014 0033   BILITOT 0.9 02/12/2014 0033   GFRNONAA 82* 02/13/2014 0225   GFRAA >90 02/13/2014 0225    Assessment and Plan  SIRS (systemic inflammatory response syndrome) 2/2 to PNA in LLL;in ICU because of hypotension and copious secretions; BC were negative  Dysphagia Dysphagia 3-mechanical soft, thin liquids with small bites  Hyponatremia Thought 2/2 dehydration; SIADH from PNA? ; recheck  Dementia without behavioral disturbance Continue exelon  HYPERTENSION, BENIGN SYSTEMIC Was hypotensive in hosp;on no meds now;will  monitor  CHRONIC KIDNEY DISEASE STAGE II (MILD) Cr 0.8    Hennie Duos, MD

## 2014-02-19 NOTE — Assessment & Plan Note (Signed)
Continue exelon

## 2014-02-19 NOTE — Assessment & Plan Note (Addendum)
2/2 to PNA in LLL;in ICU because of hypotension and copious secretions; BC were negative

## 2014-03-07 ENCOUNTER — Non-Acute Institutional Stay (SKILLED_NURSING_FACILITY): Payer: Medicare Other | Admitting: Internal Medicine

## 2014-03-07 DIAGNOSIS — D539 Nutritional anemia, unspecified: Secondary | ICD-10-CM

## 2014-03-07 DIAGNOSIS — F0390 Unspecified dementia without behavioral disturbance: Secondary | ICD-10-CM

## 2014-03-07 DIAGNOSIS — R6510 Systemic inflammatory response syndrome (SIRS) of non-infectious origin without acute organ dysfunction: Secondary | ICD-10-CM

## 2014-03-07 DIAGNOSIS — J189 Pneumonia, unspecified organism: Secondary | ICD-10-CM

## 2014-03-07 DIAGNOSIS — N182 Chronic kidney disease, stage 2 (mild): Secondary | ICD-10-CM

## 2014-03-07 DIAGNOSIS — I10 Essential (primary) hypertension: Secondary | ICD-10-CM

## 2014-03-07 DIAGNOSIS — E7800 Pure hypercholesterolemia, unspecified: Secondary | ICD-10-CM

## 2014-03-07 NOTE — Progress Notes (Signed)
MRN: [id number] Name: [name] [name]  Sex: male Age: 80 y.o. DOB: Dec 25, 1931  [place] #: [facility] Facility/Room: 129b Level Of Care: SNF Provider: Inocencio Homes D Emergency Contacts: Extended Emergency Contact Information Primary Emergency Contact: Grayson,[name] Address: [place], [place] [address] [place] Phone: [phone] Relation: Daughter  Code Status: FULL  Allergies: Review of patient's allergies indicates no known allergies.  Chief Complaint  Patient presents with  . Hospitalization Follow-up    HPI: Patient is 80 y.o. male who is being followed up s/p SIRS and PNA 3 weeks ago.  Past Medical History  Diagnosis Date  . Hypertension   . Cancer   . Rhabdomyolysis   . Dysphagia   . Symbolic dysfunction   . Fall   . Buford complex   . Dementia   . Chronic kidney disease     History reviewed. No pertinent past surgical history.    Medication List       This list is accurate as of: 03/07/14 11:59 PM.  Always use your most recent med list.               guaiFENesin 600 MG 12 hr tablet  Commonly known as:  MUCINEX  Take 600 mg by mouth 3 (three) times daily.     HYDROcodone-acetaminophen 5-325 MG per tablet  Commonly known as:  NORCO/VICODIN  Take 1 tablet by mouth every 8 (eight) hours as needed for moderate pain.     rivastigmine 4.6 mg/24hr  Commonly known as:  EXELON  Place 1 patch (4.6 mg total) onto the skin daily.     simvastatin 10 MG tablet  Commonly known as:  ZOCOR  Take 1 tablet (10 mg total) by mouth daily.     vitamin B-12 1000 MCG tablet  Commonly known as:  CYANOCOBALAMIN  Take 1,000 mcg by mouth daily.        No orders of the defined types were placed in this encounter.    Immunization History  Administered Date(s) Administered  . Influenza Split 05/08/2011, 05/07/2012  . Influenza Whole 04/22/2008, 05/25/2009  . Pneumococcal Polysaccharide-23 04/10/2010, 12/15/2013  . Td  04/10/2010    History  Substance Use Topics  . Smoking status: Never Smoker   . Smokeless tobacco: Not on file  . Alcohol Use: Yes     Comment: Pt binges on alcohol per pt and family    Review of Systems  DATA OBTAINED: from patient GENERAL: Feels well no fevers,+ fatigue but doing better SKIN: No itching, rash HEENT: No complaint RESPIRATORY: No cough, wheezing, SOB CARDIAC: No chest pain, palpitations, lower extremity edema  GI: No abdominal pain, No N/V/D or constipation, No heartburn or reflux  GU: No dysuria, frequency or urgency, or incontinence  MUSCULOSKELETAL: No unrelieved bone/joint pain NEUROLOGIC: No headache, dizziness or focal weakness PSYCHIATRIC: No overt anxiety or sadness. Sleeps well.   Filed Vitals:   03/14/14 2129  BP: 113/67  Pulse: 77  Temp: 98.5 F (36.9 C)  Resp: 20    Physical Exam  GENERAL APPEARANCE: Alert, conversant. Appropriately groomed. No acute distress  SKIN: No diaphoresis rash HEENT: Unremarkable RESPIRATORY: Breathing is even, unlabored. Lung sounds are clear   CARDIOVASCULAR: Heart RRR no murmurs, rubs or gallops. No peripheral edema  GASTROINTESTINAL: Abdomen is soft, non-tender, not distended w/ normal bowel sounds.  GENITOURINARY: Bladder non tender, not distended  MUSCULOSKELETAL: No abnormal joints or musculature NEUROLOGIC: Cranial nerves 2-12  grossly intact. Moves all extremities no tremor. PSYCHIATRIC: Mood and affect appropriate to situation, no behavioral issues  Patient Active Problem List   Diagnosis Date Noted  . Hyponatremia 02/19/2014  . Dysphagia   . HCAP (healthcare-associated pneumonia) 02/12/2014  . SIRS (systemic inflammatory response syndrome) 02/12/2014  . Severe sepsis 02/12/2014  . Hypotension 02/12/2014  . Vitamin B12 deficiency 12/14/2013  . Protein-calorie malnutrition, severe 12/13/2013  . Anemia, macrocytic 10/20/2010  . Dementia without behavioral disturbance 10/11/2009  . PROSTATE CANCER  09/25/2006  . HYPERCHOLESTEROLEMIA 09/25/2006  . HYPERTENSION, BENIGN SYSTEMIC 09/25/2006  . CHRONIC KIDNEY DISEASE STAGE II (MILD) 09/25/2006  . OSTEOARTHRITIS, MULTI SITES 09/25/2006    CBC    7/30  WBC 7.6  12.1/34.6  PLT 370    Component Value Date/Time   WBC 9.0 02/13/2014 0225   RBC 3.44* 02/13/2014 0225   HGB 11.1* 02/13/2014 0225   HCT 33.2* 02/13/2014 0225   PLT 196 02/13/2014 0225   MCV 96.5 02/13/2014 0225   LYMPHSABS 1.1 02/12/2014 0924   MONOABS 1.8* 02/12/2014 0924   EOSABS 0.0 02/12/2014 0924   BASOSABS 0.0 02/12/2014 0924    CMP 7/30  134, 4.7, 100, 95,  10/0.94, Ca 8.7    Component Value Date/Time   NA 131* 02/13/2014 0225   K 4.2 02/13/2014 0225   CL 96 02/13/2014 0225   CO2 20 02/13/2014 0225   GLUCOSE 99 02/13/2014 0225   BUN 17 02/13/2014 0225   CREATININE 0.80 02/13/2014 0225   CREATININE 1.14 09/10/2013 1640   CALCIUM 8.0* 02/13/2014 0225   PROT 7.3 02/12/2014 0033   ALBUMIN 3.3* 02/12/2014 0033   AST 37 02/12/2014 0033   ALT 30 02/12/2014 0033   ALKPHOS 106 02/12/2014 0033   BILITOT 0.9 02/12/2014 0033   GFRNONAA 82* 02/13/2014 0225   GFRAA >90 02/13/2014 0225    Assessment and Plan  SIRS (systemic inflammatory response syndrome) Pt continues to do well;getting strongr\er  HYPERTENSION, BENIGN SYSTEMIC Pt BP is good on no meds-no hypotension  HCAP (healthcare-associated pneumonia) Finished with abx;doing well  Dementia without behavioral disturbance Stable on exelon  CHRONIC KIDNEY DISEASE STAGE II (MILD) No known problems;7/30  Cr stable 0.94  HYPERCHOLESTEROLEMIA Continue zocor  Anemia, macrocytic 7/30 Hb 12.1- improved    Hennie Duos, MD

## 2014-03-14 ENCOUNTER — Encounter: Payer: Self-pay | Admitting: Internal Medicine

## 2014-03-14 NOTE — Assessment & Plan Note (Signed)
Pt continues to do well;getting strongr\er

## 2014-03-14 NOTE — Assessment & Plan Note (Addendum)
No known problems;7/30  Cr stable 0.94

## 2014-03-14 NOTE — Assessment & Plan Note (Signed)
Pt BP is good on no meds-no hypotension

## 2014-03-14 NOTE — Assessment & Plan Note (Signed)
Continue zocor 

## 2014-03-14 NOTE — Assessment & Plan Note (Signed)
Finished with abx;doing well

## 2014-03-14 NOTE — Assessment & Plan Note (Signed)
7/30 Hb 12.1- improved

## 2014-03-14 NOTE — Assessment & Plan Note (Signed)
Stable on exelon

## 2014-04-22 ENCOUNTER — Non-Acute Institutional Stay (SKILLED_NURSING_FACILITY): Payer: Medicare Other | Admitting: Nurse Practitioner

## 2014-04-22 DIAGNOSIS — I10 Essential (primary) hypertension: Secondary | ICD-10-CM

## 2014-04-22 DIAGNOSIS — R1310 Dysphagia, unspecified: Secondary | ICD-10-CM

## 2014-04-22 DIAGNOSIS — F0390 Unspecified dementia without behavioral disturbance: Secondary | ICD-10-CM

## 2014-04-22 DIAGNOSIS — M1990 Unspecified osteoarthritis, unspecified site: Secondary | ICD-10-CM

## 2014-04-22 DIAGNOSIS — N182 Chronic kidney disease, stage 2 (mild): Secondary | ICD-10-CM

## 2014-04-22 NOTE — Progress Notes (Signed)
Patient ID: [name] [name], male   DOB: Jun 09, 1932, 80 y.o.   MRN: [id number]    Nursing Home Location:  [facility]   Place of Service: SNF (31)  PCP: Rosemarie Ax, MD  No Known Allergies  Chief Complaint  Patient presents with  . Medical Management of Chronic Issues    HPI:  [name] [name] is a 80 y.o. male PMH is significant for CKD, HLD, HTN, Prostate CA and dementia who is being seen for routine follow up on chronic conditions. Pt was hospitalized in July due to PNA. Pt has been doing well at [facility] since. Pt without shortness of breath, cough or congestion. Pt with significant weight loss since original admission however RD following, supplements have been added and pt is eating well.   Review of Systems: LIMITED DUE TO DEMENTIA Review of Systems  Constitutional: Negative for malaise/fatigue.  Respiratory: Negative for cough and shortness of breath.   Cardiovascular: Negative for chest pain and leg swelling.  Gastrointestinal: Negative for heartburn, abdominal pain, diarrhea and constipation.  Genitourinary: Negative for dysuria.  Musculoskeletal: Positive for joint pain (to right leg and left arm). Negative for myalgias.  Neurological: Negative for dizziness, weakness and headaches.  Psychiatric/Behavioral: Positive for memory loss.     Past Medical History  Diagnosis Date  . Hypertension   . Cancer   . Rhabdomyolysis   . Dysphagia   . Symbolic dysfunction   . Fall   . Buford complex   . Dementia   . Chronic kidney disease    No past surgical history on file. Social History:   reports that he has never smoked. He does not have any smokeless tobacco history on file. He reports that he drinks alcohol. He reports that he does not use illicit drugs.  No family history on file.  Medications: Patient's Medications  New Prescriptions   No medications on file  Previous Medications   GUAIFENESIN (MUCINEX) 600 MG 12 HR TABLET    Take 600 mg by  mouth 3 (three) times daily.   HYDROCODONE-ACETAMINOPHEN (NORCO/VICODIN) 5-325 MG PER TABLET    Take 1 tablet by mouth every 8 (eight) hours as needed for moderate pain.   MENTHOL, TOPICAL ANALGESIC, (BIOFREEZE) 4 % GEL    Apply topically. Every 8 hours as needed to affected areas   SIMVASTATIN (ZOCOR) 10 MG TABLET    Take 1 tablet (10 mg total) by mouth daily.   VITAMIN B-12 (CYANOCOBALAMIN) 1000 MCG TABLET    Take 1,000 mcg by mouth daily.  Modified Medications   No medications on file  Discontinued Medications   RIVASTIGMINE (EXELON) 4.6 MG/24HR    Place 1 patch (4.6 mg total) onto the skin daily.     Physical Exam:  Filed Vitals:   04/22/14 1323  BP: 122/66  Pulse: 80  Temp: 97 F (36.1 C)  Resp: 20  Weight: 134 lb (60.782 kg)    GENERAL APPEARANCE: Alert, conversant. Appropriately groomed. No acute distress.   HEENT: unremarkable.     RESPIRATORY: Breathing is even, unlabored. Lung sounds are clear    CARDIOVASCULAR: Heart RRR no murmurs, rubs or gallops. No peripheral edema.   GASTROINTESTINAL: Abdomen is soft, non-tender, not distended w/ normal bowel sounds GENITOURINARY: Bladder non tender, not distended   MUSCULOSKELETAL: No abnormal joints or musculature, self propels in WC NEUROLOGIC: Oriented X1. Cranial nerves 2-12 grossly intact PSYCHIATRIC: Mood and affect appropriate to situation c/w dementia, no behavioral issues      Labs  reviewed: Basic Metabolic Panel:  Recent Labs  02/12/14 0033 02/12/14 0052 02/12/14 0924 02/13/14 0225  NA 128* 129* 131* 131*  K 4.5 4.3 4.8 4.2  CL 89* 95* 95* 96  CO2 19  --  21 20  GLUCOSE 155* 156* 104* 99  BUN '21 23 23 17  ' CREATININE 1.03 1.10 1.12 0.80  CALCIUM 9.5  --  8.2* 8.0*   Liver Function Tests:  Recent Labs  12/15/13 0655 12/29/13 1600 02/12/14 0033  AST 56* 26 37  ALT 32 20 30  ALKPHOS 61 97 106  BILITOT 0.4 0.5 0.9  PROT 5.0* 7.9 7.3  ALBUMIN 2.1* 3.6 3.3*   No results found for this basename:  LIPASE, AMYLASE,  in the last 8760 hours No results found for this basename: AMMONIA,  in the last 8760 hours CBC:  Recent Labs  12/30/13 0436 02/12/14 0033 02/12/14 0052 02/12/14 0924 02/13/14 0225  WBC 7.0 3.1*  --  8.8 9.0  NEUTROABS 4.4 2.3  --  5.9  --   HGB 11.5* 15.6 17.3* 13.1 11.1*  HCT 32.5* 45.7 51.0 39.0 33.2*  MCV 96.7 97.4  --  96.8 96.5  PLT 311 275  --  191 196   Cardiac Enzymes:  Recent Labs  12/12/13 1347  12/13/13 0457  12/30/13 0436 02/12/14 0155 02/12/14 0924  CKTOTAL 5621*  --  4562*  --   --   --   --   TROPONINI  --   < > <0.30  < > <0.30 <0.30 <0.30  < > = values in this interval not displayed. BNP: No components found with this basename: POCBNP,  CBG:  Recent Labs  02/12/14 0825 02/12/14 1133 02/13/14 2002  GLUCAP 105* 101* 119*   TSH:  Recent Labs  12/12/13 1940  TSH 4.580*   A1C: No results found for this basename: HGBA1C   Lipid Panel:  Recent Labs  09/10/13 1640  CHOL 197  HDL 37*  LDLCALC 140*  TRIG 99  CHOLHDL 5.3    CBC with Diff    Result: 02/24/2014 3:36 PM   ( Status: F )       WBC 7.6     4.0-10.5 K/uL SLN   RBC 3.75   L 4.22-5.81 MIL/uL SLN   Hemoglobin 12.1   L 13.0-17.0 g/dL SLN   Hematocrit 34.6   L 39.0-52.0 % SLN   MCV 92.3     78.0-100.0 fL SLN   MCH 32.3     26.0-34.0 pg SLN   MCHC 35.0     30.0-36.0 g/dL SLN   RDW 13.5     11.5-15.5 % SLN   Platelet Count 370     150-400 K/uL SLN   Granulocyte % 62     43-77 % SLN   Absolute Gran 4.7     1.7-7.7 K/uL SLN   Lymph % 23     12-46 % SLN   Absolute Lymph 1.7     0.7-4.0 K/uL SLN   Mono % 11     3-12 % SLN   Absolute Mono 0.8     0.1-1.0 K/uL SLN   Eos % 3     0-5 % SLN   Absolute Eos 0.2     0.0-0.7 K/uL SLN   Baso % 1     0-1 % SLN   Absolute Baso 0.1     0.0-0.1 K/uL SLN   Smear Review Criteria for review not met  SLN   Basic  Metabolic Panel    Result: 02/24/2014 4:22 PM   ( Status: F )       Sodium 134   L 135-145 mEq/L SLN    Potassium 4.7     3.5-5.3 mEq/L SLN   Chloride 100     96-112 mEq/L SLN   CO2 28     19-32 mEq/L SLN   Glucose 95     70-99 mg/dL SLN   BUN 10     6-23 mg/dL SLN   Creatinine 0.94     0.50-1.35 mg/dL SLN   Calcium 8.7     8.4-10.5 mg/dL SLN    Assessment/Plan  1. HYPERTENSION, BENIGN SYSTEMIC - Patients blood pressure is stable; continue current regimen. Will monitor and make changes as necessary.  2. Dementia without behavioral disturbance -did not tolerate exelon patch, no acute changes in cognitive or functional status.   3. OSTEOARTHRITIS, MULTI SITES conts with Voltaren gel   4. CHRONIC KIDNEY DISEASE STAGE II (MILD) Stable on recent labs  5. Dysphagia -stable, conts on mech soft with puree , no signs of aspiration

## 2014-05-09 ENCOUNTER — Encounter: Payer: Self-pay | Admitting: Internal Medicine

## 2014-05-09 ENCOUNTER — Non-Acute Institutional Stay (SKILLED_NURSING_FACILITY): Payer: Medicare Other | Admitting: Internal Medicine

## 2014-05-09 DIAGNOSIS — R1310 Dysphagia, unspecified: Secondary | ICD-10-CM

## 2014-05-09 DIAGNOSIS — E7800 Pure hypercholesterolemia, unspecified: Secondary | ICD-10-CM

## 2014-05-09 DIAGNOSIS — D539 Nutritional anemia, unspecified: Secondary | ICD-10-CM

## 2014-05-09 DIAGNOSIS — I10 Essential (primary) hypertension: Secondary | ICD-10-CM

## 2014-05-09 DIAGNOSIS — F0390 Unspecified dementia without behavioral disturbance: Secondary | ICD-10-CM

## 2014-05-09 DIAGNOSIS — E538 Deficiency of other specified B group vitamins: Secondary | ICD-10-CM

## 2014-05-09 DIAGNOSIS — N182 Chronic kidney disease, stage 2 (mild): Secondary | ICD-10-CM

## 2014-05-09 NOTE — Assessment & Plan Note (Signed)
H/H 12.1/34.6; getting b12 daily, will check level

## 2014-05-09 NOTE — Assessment & Plan Note (Signed)
Good BP on no meds

## 2014-05-09 NOTE — Assessment & Plan Note (Signed)
Continue zocor 10 and check FLP

## 2014-05-09 NOTE — Assessment & Plan Note (Signed)
On exelon, no declines since last visit; appears content

## 2014-05-09 NOTE — Assessment & Plan Note (Signed)
Pt on B12 daily but no recent levels-will check

## 2014-05-09 NOTE — Assessment & Plan Note (Signed)
GFR > 90 in 01/2014 with 10/0.94

## 2014-05-09 NOTE — Progress Notes (Signed)
MRN: [id number] Name: [name] [name]  Sex: male Age: 80 y.o. DOB: [birth year]  [place] #: [email or web address] Facility/Room:129B Level Of Care: SNF Provider: Inocencio Homes D Emergency Contacts: Extended Emergency Contact Information Primary Emergency Contact: Weil,[name] Address: [place], [place] [address] [place] Phone: [phone] Relation: Daughter  Code StatusFULL:   Allergies: Review of patient's allergies indicates no known allergies.  Chief Complaint  Patient presents with  . Medical Management of Chronic Issues    HPI: Patient is 80 y.o. male who is being seen for routine problems.   Past Medical History  Diagnosis Date  . Hypertension   . Cancer   . Rhabdomyolysis   . Dysphagia   . Symbolic dysfunction   . Fall   . Buford complex   . Dementia   . Chronic kidney disease     History reviewed. No pertinent past surgical history.    Medication List       This list is accurate as of: 05/09/14  1:54 PM.  Always use your most recent med list.               BIOFREEZE 4 % Gel  Generic drug:  Menthol (Topical Analgesic)  Apply topically. Every 8 hours as needed to affected areas     guaiFENesin 600 MG 12 hr tablet  Commonly known as:  MUCINEX  Take 600 mg by mouth 3 (three) times daily.     HYDROcodone-acetaminophen 5-325 MG per tablet  Commonly known as:  NORCO/VICODIN  Take 1 tablet by mouth every 8 (eight) hours as needed for moderate pain.     simvastatin 10 MG tablet  Commonly known as:  ZOCOR  Take 1 tablet (10 mg total) by mouth daily.     vitamin B-12 1000 MCG tablet  Commonly known as:  CYANOCOBALAMIN  Take 1,000 mcg by mouth daily.        No orders of the defined types were placed in this encounter.    Immunization History  Administered Date(s) Administered  . Influenza Split 05/08/2011, 05/07/2012  . Influenza Whole 04/22/2008, 05/25/2009  . Pneumococcal Polysaccharide-23 04/10/2010, 12/15/2013   . Td 04/10/2010    History  Substance Use Topics  . Smoking status: Never Smoker   . Smokeless tobacco: Not on file  . Alcohol Use: Yes     Comment: Pt binges on alcohol per pt and family    Review of Systems  DATA OBTAINED: from patient; GENERAL:  no fevers, fatigue, appetite changes; no c/o SKIN: No itching, rash HEENT: No complaint RESPIRATORY: No cough, wheezing, SOB CARDIAC: No chest pain, palpitations, lower extremity edema  GI: No abdominal pain, No N/V/D or constipation, No heartburn or reflux  GU: No dysuria, frequency or urgency, or incontinence  MUSCULOSKELETAL: No unrelieved bone/joint pain NEUROLOGIC: No headache, dizziness  PSYCHIATRIC: No overt anxiety or sadness  Filed Vitals:   05/09/14 1343  BP: 120/71  Pulse: 102  Temp: 97.7 F (36.5 C)  Resp: 20    Physical Exam  GENERAL APPEARANCE: Alert, conversant, No acute distress;very pleasant BM who is in room only because waiting for flu shot  SKIN: No diaphoresis rash HEENT: Unremarkable RESPIRATORY: Breathing is even, unlabored. Lung sounds are clear   CARDIOVASCULAR: Heart RRR no murmurs, rubs or gallops. No peripheral edema  GASTROINTESTINAL: Abdomen is soft, non-tender, not distended w/ normal bowel sounds.  GENITOURINARY: Bladder non tender, not distended  MUSCULOSKELETAL: No abnormal joints  or musculature; feet in booties NEUROLOGIC: Cranial nerves 2-12 grossly intact. Moves all extremities PSYCHIATRIC: Mood and affect appropriate to situation, no behavioral issues  Patient Active Problem List   Diagnosis Date Noted  . Hyponatremia 02/19/2014  . Dysphagia   . HCAP (healthcare-associated pneumonia) 02/12/2014  . SIRS (systemic inflammatory response syndrome) 02/12/2014  . Severe sepsis 02/12/2014  . Hypotension 02/12/2014  . Vitamin B12 deficiency 12/14/2013  . Protein-calorie malnutrition, severe 12/13/2013  . Anemia, macrocytic 10/20/2010  . Dementia without behavioral disturbance  10/11/2009  . PROSTATE CANCER 09/25/2006  . HYPERCHOLESTEROLEMIA 09/25/2006  . HYPERTENSION, BENIGN SYSTEMIC 09/25/2006  . CHRONIC KIDNEY DISEASE STAGE II (MILD) 09/25/2006  . OSTEOARTHRITIS, MULTI SITES 09/25/2006    CBC    Component Value Date/Time   WBC 9.0 02/13/2014 0225   RBC 3.44* 02/13/2014 0225   HGB 11.1* 02/13/2014 0225   HCT 33.2* 02/13/2014 0225   PLT 196 02/13/2014 0225   MCV 96.5 02/13/2014 0225   LYMPHSABS 1.1 02/12/2014 0924   MONOABS 1.8* 02/12/2014 0924   EOSABS 0.0 02/12/2014 0924   BASOSABS 0.0 02/12/2014 0924    CMP     Component Value Date/Time   NA 131* 02/13/2014 0225   K 4.2 02/13/2014 0225   CL 96 02/13/2014 0225   CO2 20 02/13/2014 0225   GLUCOSE 99 02/13/2014 0225   BUN 17 02/13/2014 0225   CREATININE 0.80 02/13/2014 0225   CREATININE 1.14 09/10/2013 1640   CALCIUM 8.0* 02/13/2014 0225   PROT 7.3 02/12/2014 0033   ALBUMIN 3.3* 02/12/2014 0033   AST 37 02/12/2014 0033   ALT 30 02/12/2014 0033   ALKPHOS 106 02/12/2014 0033   BILITOT 0.9 02/12/2014 0033   GFRNONAA 82* 02/13/2014 0225   GFRAA >90 02/13/2014 0225    Assessment and Plan  Dementia without behavioral disturbance On exelon, no declines since last visit; appears content  HYPERTENSION, BENIGN SYSTEMIC Good BP on no meds  Vitamin B12 deficiency Pt on B12 daily but no recent levels-will check  Dysphagia Now on pureed and thi liquids without reported problems  CHRONIC KIDNEY DISEASE STAGE II (MILD) GFR > 90 in 01/2014 with 10/0.94  Anemia, macrocytic H/H 12.1/34.6; getting b12 daily, will check level  HYPERCHOLESTEROLEMIA Continue zocor 10 and check FLP    Hennie Duos, MD

## 2014-05-09 NOTE — Assessment & Plan Note (Signed)
Now on pureed and thi liquids without reported problems

## 2014-06-17 ENCOUNTER — Non-Acute Institutional Stay (SKILLED_NURSING_FACILITY): Payer: Medicare Other | Admitting: Nurse Practitioner

## 2014-06-17 DIAGNOSIS — M179 Osteoarthritis of knee, unspecified: Secondary | ICD-10-CM

## 2014-06-17 DIAGNOSIS — IMO0002 Reserved for concepts with insufficient information to code with codable children: Secondary | ICD-10-CM

## 2014-06-17 DIAGNOSIS — N182 Chronic kidney disease, stage 2 (mild): Secondary | ICD-10-CM

## 2014-06-17 DIAGNOSIS — D539 Nutritional anemia, unspecified: Secondary | ICD-10-CM

## 2014-06-17 DIAGNOSIS — M1710 Unilateral primary osteoarthritis, unspecified knee: Secondary | ICD-10-CM

## 2014-06-17 DIAGNOSIS — R1310 Dysphagia, unspecified: Secondary | ICD-10-CM

## 2014-06-17 NOTE — Progress Notes (Signed)
Patient ID: [name] [name], male   DOB: February 18, 1932, 80 y.o.   MRN: [id number]    Nursing Home Location:  [facility]   Place of Service: SNF (31)  PCP: Rosemarie Ax, MD  No Known Allergies  Chief Complaint  Patient presents with  . Medical Management of Chronic Issues    HPI:  Patient is a 80 y.o. male seen today at [facility] for routine follow up on chronic conditions. Pt with a PMH is significant for CKD, HLD, HTN, Prostate CA and dementia. There has been no acute issues in the last month. Pts chronic conditions remain at baseline. Nursing has no concerns at this time.   Review of Systems:  LIMITED due to dementia  Review of Systems  Constitutional: Negative for activity change, appetite change, fatigue and unexpected weight change.  HENT: Negative for congestion and hearing loss.   Eyes: Negative.   Respiratory: Negative for cough and shortness of breath.   Cardiovascular: Negative for chest pain, palpitations and leg swelling.  Gastrointestinal: Negative for abdominal pain, diarrhea and constipation.  Genitourinary: Negative for dysuria and difficulty urinating.  Musculoskeletal: Positive for arthralgias. Negative for myalgias.  Skin: Negative for color change and wound.  Neurological: Negative for dizziness and weakness.  Psychiatric/Behavioral: Positive for confusion. Negative for behavioral problems and agitation.       STML    Past Medical History  Diagnosis Date  . Hypertension   . Cancer   . Rhabdomyolysis   . Dysphagia   . Symbolic dysfunction   . Fall   . Buford complex   . Dementia   . Chronic kidney disease    No past surgical history on file. Social History:   reports that he has never smoked. He does not have any smokeless tobacco history on file. He reports that he drinks alcohol. He reports that he does not use illicit drugs.  No family history on file.  Medications: Patient's Medications  New Prescriptions   No medications on file  Previous Medications   GUAIFENESIN (MUCINEX) 600 MG 12 HR TABLET    Take 600 mg by mouth 3 (three) times daily.   HYDROCODONE-ACETAMINOPHEN (NORCO/VICODIN) 5-325 MG PER TABLET    Take 1 tablet by mouth every 8 (eight) hours as needed for moderate pain.   MENTHOL, TOPICAL ANALGESIC, (BIOFREEZE) 4 % GEL    Apply topically. Every 8 hours as needed to affected areas   SIMVASTATIN (ZOCOR) 10 MG TABLET    Take 1 tablet (10 mg total) by mouth daily.   VITAMIN B-12 (CYANOCOBALAMIN) 1000 MCG TABLET    Take 1,000 mcg by mouth daily.  Modified Medications   No medications on file  Discontinued Medications   No medications on file     Physical Exam: Filed Vitals:   06/17/14 1636  BP: 122/60  Pulse: 66  Temp: 98.1 F (36.7 C)  Resp: 20    Physical Exam  Constitutional: He is oriented to person, place, and time. No distress.  HENT:  Head: Normocephalic and atraumatic.  Mouth/Throat: Oropharynx is clear and moist. No oropharyngeal exudate.  Eyes: Conjunctivae are normal. Pupils are equal, round, and reactive to light.  Neck: Normal range of motion. Neck supple.  Cardiovascular: Normal rate, regular rhythm and normal heart sounds.   Pulmonary/Chest: Effort normal and breath sounds normal.  Abdominal: Soft. Bowel sounds are normal.  Musculoskeletal: He exhibits no edema or tenderness.  Neurological: He is alert and oriented to person, place, and  time.  Right sided weakness due to CVA  Skin: Skin is warm and dry. He is not diaphoretic.  Psychiatric: He has a normal mood and affect. He exhibits abnormal recent memory and abnormal remote memory.    Labs reviewed: Basic Metabolic Panel:  Recent Labs  02/12/14 0033 02/12/14 0052 02/12/14 0924 02/13/14 0225  NA 128* 129* 131* 131*  K 4.5 4.3 4.8 4.2  CL 89* 95* 95* 96  CO2 19  --  21 20  GLUCOSE 155* 156* 104* 99  BUN _0 CREATININE 1.03 1.10 1.12 0.80  CALCIUM 9.5  --  8.2* 8.0*   Liver Function  Tests:  Recent Labs  12/15/13 0655 12/29/13 1600 02/12/14 0033  AST 56* 26 37  ALT 32 20 30  ALKPHOS 61 97 106  BILITOT 0.4 0.5 0.9  PROT 5.0* 7.9 7.3  ALBUMIN 2.1* 3.6 3.3*   No results for input(s): LIPASE, AMYLASE in the last 8760 hours. No results for input(s): AMMONIA in the last 8760 hours. CBC:  Recent Labs  12/30/13 0436 02/12/14 0033 02/12/14 0052 02/12/14 0924 02/13/14 0225  WBC 7.0 3.1*  --  8.8 9.0  NEUTROABS 4.4 2.3  --  5.9  --   HGB 11.5* 15.6 17.3* 13.1 11.1*  HCT 32.5* 45.7 51.0 39.0 33.2*  MCV 96.7 97.4  --  96.8 96.5  PLT 311 275  --  191 196   TSH:  Recent Labs  12/12/13 1940  TSH 4.580*   A1C: No results found for: HGBA1C Lipid Panel:  Recent Labs  09/10/13 1640  CHOL 197  HDL 37*  LDLCALC 140*  TRIG 99  CHOLHDL 5.3    CBC with Diff  Result: 02/24/2014 3:36 PM ( Status: F )   WBC7.6  4.0-10.5K/uLSLN  RBC3.75 L4.22-5.81MIL/uLSLN  Hemoglobin12.1 L13.0-17.0g/dLSLN  Hematocrit34.6 L39.0-52.0%SLN  MCV92.3  78.0-100.6fSLN  MCH32.3  26.0-34.0pgSLN  MCHC35.0  30.0-36.0g/dLSLN  RDW13.5  11.5-15.5%SLN  Platelet Count370  150-400K/uLSLN  Granulocyte %62  43-77%SLN  Absolute Gran4.7  1.7-7.7K/uLSLN  Lymph %23  12-46%SLN  Absolute Lymph1.7  0.7-4.0K/uLSLN   Mono %11  3-12%SLN  Absolute Mono0.8  0.1-1.0K/uLSLN  Eos %3  0-5%SLN  Absolute Eos0.2  0.0-0.7K/uLSLN  Baso %1  0-1%SLN  Absolute Baso0.1  0.0-0.1K/uLSLN  Smear ReviewCriteria for review not metSLN  Basic Metabolic Panel  Result: 71/76/16074:22 PM ( Status: F )   Sodium134 L135-1446m/LSLN  Potassium4.7  3.5-5.27m36mLSLN  Chloride100  96-112m42mSLN  CO228  19-32mE4mLN  Glucose95  70-99mg/66mN  BUN10  6-227mg/d39m  Creatinine0.94  0.50-1.35mg/dLSLN  Calcium8.7  8.4-10.5mg/dLS37m  Assessment/Plan 1. Osteoarthrosis, unspecified whether generalized or localized, involving lower leg Pain remains stable on biofreeze and PRN Vicodin   2. Dysphagia conts on mech soft, no signs of aspiration noted  3. CHRONIC KIDNEY DISEASE STAGE II (MILD) Follow up BMP  4. Anemia, macrocytic conts on b12, will follow up cbc

## 2014-07-19 ENCOUNTER — Non-Acute Institutional Stay (SKILLED_NURSING_FACILITY): Payer: Medicare Other | Admitting: Nurse Practitioner

## 2014-07-19 DIAGNOSIS — M179 Osteoarthritis of knee, unspecified: Secondary | ICD-10-CM

## 2014-07-19 DIAGNOSIS — N182 Chronic kidney disease, stage 2 (mild): Secondary | ICD-10-CM

## 2014-07-19 DIAGNOSIS — IMO0002 Reserved for concepts with insufficient information to code with codable children: Secondary | ICD-10-CM

## 2014-07-19 DIAGNOSIS — D539 Nutritional anemia, unspecified: Secondary | ICD-10-CM

## 2014-07-19 DIAGNOSIS — M1710 Unilateral primary osteoarthritis, unspecified knee: Secondary | ICD-10-CM

## 2014-07-19 DIAGNOSIS — F0390 Unspecified dementia without behavioral disturbance: Secondary | ICD-10-CM

## 2014-07-19 NOTE — Progress Notes (Signed)
Patient ID: [name] [name], male   DOB: 02-01-1932, 80 y.o.   MRN: [id number]    Nursing Home Location:  [facility]   Place of Service: SNF (31)  PCP: Rosemarie Ax, MD  No Known Allergies  Chief Complaint  Patient presents with  . Medical Management of Chronic Issues    HPI:  Patient is a 80 y.o. male seen today at [facility] for routine follow up on chronic conditions. Pt with a PMH is significant for CKD, HLD, HTN, Prostate CA and dementia. There has been no acute issues in the last month. Pt self propels in the hallways and is pleasantly confused, staff without concerns at this time. Weight has been stable.   Review of Systems:  LIMITED due to dementia  Review of Systems  Constitutional: Negative for activity change, appetite change, fatigue and unexpected weight change.  HENT: Negative for congestion and hearing loss.   Eyes: Negative.   Respiratory: Negative for cough and shortness of breath.   Cardiovascular: Negative for chest pain, palpitations and leg swelling.  Gastrointestinal: Negative for abdominal pain, diarrhea and constipation.  Genitourinary: Negative for dysuria and difficulty urinating.  Musculoskeletal: Positive for arthralgias. Negative for myalgias.  Skin: Negative for color change and wound.  Neurological: Negative for dizziness and weakness.  Psychiatric/Behavioral: Positive for confusion. Negative for behavioral problems and agitation.       STML    Past Medical History  Diagnosis Date  . Hypertension   . Cancer   . Rhabdomyolysis   . Dysphagia   . Symbolic dysfunction   . Fall   . Buford complex   . Dementia   . Chronic kidney disease    No past surgical history on file. Social History:   reports that he has never smoked. He does not have any smokeless tobacco history on file. He reports that he drinks alcohol. He reports that he does not use illicit drugs.  No family history on  file.  Medications: Patient's Medications  New Prescriptions   No medications on file  Previous Medications   GUAIFENESIN (MUCINEX) 600 MG 12 HR TABLET    Take 600 mg by mouth 3 (three) times daily.   HYDROCODONE-ACETAMINOPHEN (NORCO/VICODIN) 5-325 MG PER TABLET    Take 1 tablet by mouth every 8 (eight) hours as needed for moderate pain.   MENTHOL, TOPICAL ANALGESIC, (BIOFREEZE) 4 % GEL    Apply topically. Every 8 hours as needed to affected areas   SIMVASTATIN (ZOCOR) 10 MG TABLET    Take 1 tablet (10 mg total) by mouth daily.   VITAMIN B-12 (CYANOCOBALAMIN) 1000 MCG TABLET    Take 1,000 mcg by mouth daily.  Modified Medications   No medications on file  Discontinued Medications   No medications on file     Physical Exam: Filed Vitals:   07/19/14 1451  BP: 116/64  Pulse: 75  Temp: 97.4 F (36.3 C)  Resp: 20  Weight: 131 lb (59.421 kg)    Physical Exam  Constitutional: He is oriented to person, place, and time. No distress.  HENT:  Head: Normocephalic and atraumatic.  Mouth/Throat: Oropharynx is clear and moist. No oropharyngeal exudate.  Eyes: Conjunctivae are normal. Pupils are equal, round, and reactive to light.  Neck: Normal range of motion. Neck supple.  Cardiovascular: Normal rate, regular rhythm and normal heart sounds.   Pulmonary/Chest: Effort normal and breath sounds normal.  Abdominal: Soft. Bowel sounds are normal.  Musculoskeletal: He exhibits no  edema or tenderness.  Neurological: He is alert and oriented to person, place, and time.  Right sided weakness due to CVA  Skin: Skin is warm and dry. He is not diaphoretic.  Psychiatric: He has a normal mood and affect. He exhibits abnormal recent memory and abnormal remote memory.    Labs reviewed: Basic Metabolic Panel:  Recent Labs  02/12/14 0033 02/12/14 0052 02/12/14 0924 02/13/14 0225  NA 128* 129* 131* 131*  K 4.5 4.3 4.8 4.2  CL 89* 95* 95* 96  CO2 19  --  21 20  GLUCOSE 155* 156* 104* 99   BUN _0 CREATININE 1.03 1.10 1.12 0.80  CALCIUM 9.5  --  8.2* 8.0*   Liver Function Tests:  Recent Labs  12/15/13 0655 12/29/13 1600 02/12/14 0033  AST 56* 26 37  ALT 32 20 30  ALKPHOS 61 97 106  BILITOT 0.4 0.5 0.9  PROT 5.0* 7.9 7.3  ALBUMIN 2.1* 3.6 3.3*   No results for input(s): LIPASE, AMYLASE in the last 8760 hours. No results for input(s): AMMONIA in the last 8760 hours. CBC:  Recent Labs  12/30/13 0436 02/12/14 0033 02/12/14 0052 02/12/14 0924 02/13/14 0225  WBC 7.0 3.1*  --  8.8 9.0  NEUTROABS 4.4 2.3  --  5.9  --   HGB 11.5* 15.6 17.3* 13.1 11.1*  HCT 32.5* 45.7 51.0 39.0 33.2*  MCV 96.7 97.4  --  96.8 96.5  PLT 311 275  --  191 196   TSH:  Recent Labs  12/12/13 1940  TSH 4.580*   A1C: No results found for: HGBA1C Lipid Panel:  Recent Labs  09/10/13 1640  CHOL 197  HDL 37*  LDLCALC 140*  TRIG 99  CHOLHDL 5.3    CBC with Diff  Result: 02/24/2014 3:36 PM ( Status: F )   WBC7.6  4.0-10.5K/uLSLN  RBC3.75 L4.22-5.81MIL/uLSLN  Hemoglobin12.1 L13.0-17.0g/dLSLN  Hematocrit34.6 L39.0-52.0%SLN  MCV92.3  78.0-100.64fSLN  MCH32.3  26.0-34.0pgSLN  MCHC35.0  30.0-36.0g/dLSLN  RDW13.5  11.5-15.5%SLN  Platelet Count370  150-400K/uLSLN  Granulocyte %62  43-77%SLN  Absolute Gran4.7  1.7-7.7K/uLSLN  Lymph %23   12-46%SLN  Absolute Lymph1.7  0.7-4.0K/uLSLN  Mono %11  3-12%SLN  Absolute Mono0.8  0.1-1.0K/uLSLN  Eos %3  0-5%SLN  Absolute Eos0.2  0.0-0.7K/uLSLN  Baso %1  0-1%SLN  Absolute Baso0.1  0.0-0.1K/uLSLN  Smear ReviewCriteria for review not metSLN  Basic Metabolic Panel  Result: 71/31/43884:22 PM ( Status: F )   Sodium134 L135-1449m/LSLN  Potassium4.7  3.5-5.80m41mLSLN  Chloride100  96-112m59mSLN  CO228  19-32mE60mLN  Glucose95  70-99mg/22mN  BUN10  6-280mg/d51m  Creatinine0.94  0.50-1.35mg/dLSLN  Calcium8.7  8.4-10.5mg/dLS56m CBC with Diff    Result: 06/21/2014 2:38 PM   ( Status: F )     C WBC 7.2     4.0-10.5 K/uL SLN   RBC 3.62   L 4.22-5.81 MIL/uL SLN   Hemoglobin 11.3   L 13.0-17.0 g/dL SLN   Hematocrit 33.1   L 39.0-52.0 % SLN   MCV 91.4     78.0-100.0 fL SLN   MCH 31.2     26.0-34.0 pg SLN   MCHC 34.1     30.0-36.0 g/dL SLN   RDW 13.5     11.5-15.5 % SLN   Platelet Count 302     150-400 K/uL SLN   Granulocyte % 54     43-77 % SLN   Absolute Gran 3.9     1.7-7.7 K/uL SLN   Lymph %  25     12-46 % SLN   Absolute Lymph 1.8      0.7-4.0 K/uL SLN   Mono % 11     3-12 % SLN   Absolute Mono 0.8     0.1-1.0 K/uL SLN   Eos % 10   H 0-5 % SLN   Absolute Eos 0.7     0.0-0.7 K/uL SLN   Baso % 0     0-1 % SLN   Absolute Baso 0.0     0.0-0.1 K/uL SLN   Smear Review Criteria for review not met  SLN   MPV 8.1   L 9.4-12.4 fL SLN   Comprehensive Metabolic Panel    Result: 06/21/2014 4:39 PM   ( Status: F )       Sodium 133   L 135-145 mEq/L SLN   Potassium 4.3     3.5-5.3 mEq/L SLN   Chloride 99     96-112 mEq/L SLN   CO2 28     19-32 mEq/L SLN   Glucose 98     70-99 mg/dL SLN   BUN 15     6-23 mg/dL SLN   Creatinine 0.74     0.50-1.35 mg/dL SLN   Bilirubin, Total 0.4     0.2-1.2 mg/dL SLN   Alkaline Phosphatase 75     39-117 U/L SLN   AST/SGOT 16     0-37 U/L SLN   ALT/SGPT 9     0-53 U/L SLN   Total Protein 5.9   L 6.0-8.3 g/dL SLN   Albumin 3.3   L 3.5-5.2 g/dL SLN   Calcium 8.8     8.4-10.5 mg/dL Assessment/Plan 1. Osteoarthrosis, unspecified whether generalized or localized, involving lower leg No worsening of OA, conts on scheduled biofreeze  2. CHRONIC KIDNEY DISEASE STAGE II (MILD) Kidney disease stable, Cr 0.74  3. Dementia without behavioral disturbance Did not tolerate exelon patch, cognitive and functional status remains unchanged.   4. Anemia, macrocytic hgb slightly lower than previous, no signs of bleeding, b12 was stopped in September, will cont to follow at this time

## 2014-09-09 ENCOUNTER — Non-Acute Institutional Stay (SKILLED_NURSING_FACILITY): Payer: Medicare Other | Admitting: Nurse Practitioner

## 2014-09-09 DIAGNOSIS — N182 Chronic kidney disease, stage 2 (mild): Secondary | ICD-10-CM

## 2014-09-09 DIAGNOSIS — E7800 Pure hypercholesterolemia, unspecified: Secondary | ICD-10-CM

## 2014-09-09 DIAGNOSIS — F0390 Unspecified dementia without behavioral disturbance: Secondary | ICD-10-CM

## 2014-09-09 DIAGNOSIS — M179 Osteoarthritis of knee, unspecified: Secondary | ICD-10-CM

## 2014-09-09 DIAGNOSIS — I10 Essential (primary) hypertension: Secondary | ICD-10-CM

## 2014-09-09 DIAGNOSIS — E43 Unspecified severe protein-calorie malnutrition: Secondary | ICD-10-CM

## 2014-09-09 DIAGNOSIS — D539 Nutritional anemia, unspecified: Secondary | ICD-10-CM

## 2014-09-09 DIAGNOSIS — IMO0002 Reserved for concepts with insufficient information to code with codable children: Secondary | ICD-10-CM

## 2014-09-09 DIAGNOSIS — M1710 Unilateral primary osteoarthritis, unspecified knee: Secondary | ICD-10-CM

## 2014-09-09 NOTE — Progress Notes (Signed)
Patient ID: [name] [name], male   DOB: Dec 15, 1931, 81 y.o.   MRN: [id number]    Nursing Home Location:  [facility]   Place of Service: SNF (31)  PCP: Rosemarie Ax, MD  No Known Allergies  Chief Complaint  Patient presents with  . Medical Management of Chronic Issues    HPI:  Patient is a 81 y.o. male seen today at [facility] for routine follow up on chronic conditions. Pt with a PMH is significant for CKD, HLD, HTN, Prostate CA and dementia. Pt has done well in the last month. Ortho appt was made due to right leg pain but changed by daughter, plans to go this month. PO intake has been stable, no weight loss in the last month. Pt getting OOB and self propels in WC. Staff without concerns at this time.  Review of Systems:   Review of Systems  Constitutional: Negative for activity change, appetite change, fatigue and unexpected weight change.  HENT: Negative for congestion and hearing loss.   Eyes: Negative.   Respiratory: Negative for cough and shortness of breath.   Cardiovascular: Negative for chest pain, palpitations and leg swelling.  Gastrointestinal: Negative for abdominal pain, diarrhea and constipation.  Genitourinary: Negative for dysuria and difficulty urinating.  Musculoskeletal: Positive for arthralgias. Negative for myalgias.  Skin: Negative for color change and wound.  Neurological: Negative for dizziness and weakness.  Psychiatric/Behavioral: Positive for confusion. Negative for behavioral problems and agitation.       STML    Past Medical History  Diagnosis Date  . Hypertension   . Cancer   . Rhabdomyolysis   . Dysphagia   . Symbolic dysfunction   . Fall   . Buford complex   . Dementia   . Chronic kidney disease    No past surgical history on file. Social History:   reports that he has never smoked. He does not have any smokeless tobacco history on file. He reports that he drinks alcohol. He reports that he does not use  illicit drugs.  No family history on file.  Medications: Patient's Medications  New Prescriptions   No medications on file  Previous Medications   GUAIFENESIN (MUCINEX) 600 MG 12 HR TABLET    Take 600 mg by mouth 3 (three) times daily.   HYDROCODONE-ACETAMINOPHEN (NORCO/VICODIN) 5-325 MG PER TABLET    Take 1 tablet by mouth every 8 (eight) hours as needed for moderate pain.   MENTHOL, TOPICAL ANALGESIC, (BIOFREEZE) 4 % GEL    Apply topically. Every 8 hours as needed to affected areas   SIMVASTATIN (ZOCOR) 10 MG TABLET    Take 1 tablet (10 mg total) by mouth daily.  Modified Medications   No medications on file  Discontinued Medications   No medications on file     Physical Exam: Filed Vitals:   09/09/14 0958  BP: 120/75  Pulse: 86  Temp: 97 F (36.1 C)  Resp: 20  Weight: 132 lb (59.875 kg)    Physical Exam  Constitutional: He is oriented to person, place, and time. No distress.  HENT:  Head: Normocephalic and atraumatic.  Mouth/Throat: Oropharynx is clear and moist. No oropharyngeal exudate.  Eyes: Conjunctivae are normal. Pupils are equal, round, and reactive to light.  Neck: Normal range of motion. Neck supple.  Cardiovascular: Normal rate, regular rhythm and normal heart sounds.   Pulmonary/Chest: Effort normal and breath sounds normal.  Abdominal: Soft. Bowel sounds are normal.  Musculoskeletal: He exhibits no  edema or tenderness.  Neurological: He is alert and oriented to person, place, and time.  Right sided weakness due to CVA  Skin: Skin is warm and dry. He is not diaphoretic.  Psychiatric: He has a normal mood and affect. He exhibits abnormal recent memory and abnormal remote memory.    Labs reviewed: Basic Metabolic Panel:  Recent Labs  02/12/14 0033 02/12/14 0052 02/12/14 0924 02/13/14 0225  NA 128* 129* 131* 131*  K 4.5 4.3 4.8 4.2  CL 89* 95* 95* 96  CO2 19  --  21 20  GLUCOSE 155* 156* 104* 99  BUN _0 CREATININE 1.03 1.10 1.12  0.80  CALCIUM 9.5  --  8.2* 8.0*   Liver Function Tests:  Recent Labs  12/15/13 0655 12/29/13 1600 02/12/14 0033  AST 56* 26 37  ALT 32 20 30  ALKPHOS 61 97 106  BILITOT 0.4 0.5 0.9  PROT 5.0* 7.9 7.3  ALBUMIN 2.1* 3.6 3.3*   No results for input(s): LIPASE, AMYLASE in the last 8760 hours. No results for input(s): AMMONIA in the last 8760 hours. CBC:  Recent Labs  12/30/13 0436 02/12/14 0033 02/12/14 0052 02/12/14 0924 02/13/14 0225  WBC 7.0 3.1*  --  8.8 9.0  NEUTROABS 4.4 2.3  --  5.9  --   HGB 11.5* 15.6 17.3* 13.1 11.1*  HCT 32.5* 45.7 51.0 39.0 33.2*  MCV 96.7 97.4  --  96.8 96.5  PLT 311 275  --  191 196   TSH:  Recent Labs  12/12/13 1940  TSH 4.580*   A1C: No results found for: HGBA1C Lipid Panel:  Recent Labs  09/10/13 1640  CHOL 197  HDL 37*  LDLCALC 140*  TRIG 99  CHOLHDL 5.3    CBC with Diff  Result: 02/24/2014 3:36 PM ( Status: F )   WBC7.6  4.0-10.5K/uLSLN  RBC3.75 L4.22-5.81MIL/uLSLN  Hemoglobin12.1 L13.0-17.0g/dLSLN  Hematocrit34.6 L39.0-52.0%SLN  MCV92.3  78.0-100.45fSLN  MCH32.3  26.0-34.0pgSLN  MCHC35.0  30.0-36.0g/dLSLN  RDW13.5  11.5-15.5%SLN  Platelet Count370  150-400K/uLSLN  Granulocyte %62  43-77%SLN  Absolute Gran4.7  1.7-7.7K/uLSLN  Lymph %23  12-46%SLN  Absolute  Lymph1.7  0.7-4.0K/uLSLN  Mono %11  3-12%SLN  Absolute Mono0.8  0.1-1.0K/uLSLN  Eos %3  0-5%SLN  Absolute Eos0.2  0.0-0.7K/uLSLN  Baso %1  0-1%SLN  Absolute Baso0.1  0.0-0.1K/uLSLN  Smear ReviewCriteria for review not metSLN  Basic Metabolic Panel  Result: 70/27/25364:22 PM ( Status: F )   Sodium134 L135-1476m/LSLN  Potassium4.7  3.5-5.14m51mLSLN  Chloride100  96-112m86mSLN  CO228  19-32mE55mLN  Glucose95  70-99mg/54mN  BUN10  6-214mg/d55m  Creatinine0.94  0.50-1.35mg/dLSLN  Calcium8.7  8.4-10.5mg/dLS61m CBC with Diff    Result: 06/21/2014 2:38 PM   ( Status: F )     C WBC 7.2     4.0-10.5 K/uL SLN   RBC 3.62   L 4.22-5.81 MIL/uL SLN   Hemoglobin 11.3   L 13.0-17.0 g/dL SLN   Hematocrit 33.1   L 39.0-52.0 % SLN   MCV 91.4     78.0-100.0 fL SLN   MCH 31.2     26.0-34.0 pg SLN   MCHC 34.1     30.0-36.0 g/dL SLN   RDW 13.5     11.5-15.5 % SLN   Platelet Count 302     150-400 K/uL SLN   Granulocyte % 54     43-77 % SLN   Absolute Gran 3.9     1.7-7.7 K/uL SLN   Lymph %  25     12-46 % SLN   Absolute Lymph 1.8     0.7-4.0 K/uL SLN   Mono % 11     3-12 % SLN     Absolute Mono 0.8     0.1-1.0 K/uL SLN   Eos % 10   H 0-5 % SLN   Absolute Eos 0.7     0.0-0.7 K/uL SLN   Baso % 0     0-1 % SLN   Absolute Baso 0.0     0.0-0.1 K/uL SLN   Smear Review Criteria for review not met  SLN   MPV 8.1   L 9.4-12.4 fL SLN   Comprehensive Metabolic Panel    Result: 06/21/2014 4:39 PM   ( Status: F )       Sodium 133   L 135-145 mEq/L SLN   Potassium 4.3     3.5-5.3 mEq/L SLN   Chloride 99     96-112 mEq/L SLN   CO2 28     19-32 mEq/L SLN   Glucose 98     70-99 mg/dL SLN   BUN 15     6-23 mg/dL SLN   Creatinine 0.74     0.50-1.35 mg/dL SLN   Bilirubin, Total 0.4     0.2-1.2 mg/dL SLN   Alkaline Phosphatase 75     39-117 U/L SLN   AST/SGOT 16     0-37 U/L SLN   ALT/SGPT 9     0-53 U/L SLN   Total Protein 5.9   L 6.0-8.3 g/dL SLN   Albumin 3.3   L 3.5-5.2 g/dL SLN   Calcium 8.8     8.4-10.5 mg/dL Assessment/Plan 1. Osteoarthrosis, unspecified whether generalized or localized, involving lower leg Ortho consult was made due to OA, today reports pain is mild and medications helps.  Does not require pain medication daily.   2. CHRONIC KIDNEY DISEASE STAGE II (MILD) Kidney disease stable, Cr 0.74, will follow up bmp  3. Dementia without behavioral disturbance Currently not on medication, cognitive and functional status remains unchanged.   4. Anemia, macrocytic hgb slightly lower than previous, no signs of bleeding, b12 was stopped in September, will follow up cbc   5. HYPERTENSION, BENIGN SYSTEMIC Controlled without medications   6. Hyperlipidemia conts on zocor  7. Protein calorie malnutrition  Weight has been stable in the last 90 days, RD following pt at facility. Currently on supplements

## 2014-09-26 ENCOUNTER — Encounter: Payer: Self-pay | Admitting: Internal Medicine

## 2014-09-26 ENCOUNTER — Non-Acute Institutional Stay (SKILLED_NURSING_FACILITY): Payer: Medicare Other | Admitting: Internal Medicine

## 2014-09-26 DIAGNOSIS — R1310 Dysphagia, unspecified: Secondary | ICD-10-CM

## 2014-09-26 DIAGNOSIS — N182 Chronic kidney disease, stage 2 (mild): Secondary | ICD-10-CM

## 2014-09-26 DIAGNOSIS — E871 Hypo-osmolality and hyponatremia: Secondary | ICD-10-CM

## 2014-09-26 NOTE — Assessment & Plan Note (Signed)
Most recent Na+ 133, still lowish; have ordered repeat BMP; Plan-conmtinue monitoring

## 2014-09-26 NOTE — Assessment & Plan Note (Signed)
Chronic and stable without ; plan;continue monitoringsigns or sx of aspiration

## 2014-09-26 NOTE — Progress Notes (Signed)
MRN: [id number] Name: [name] [name]  Sex: male Age: 81 y.o. DOB: Jan 19, 1932  [place] [facility] Facility/Room:129B Level Of Care: SNF Provider: Inocencio Homes D Emergency Contacts: Extended Emergency Contact Information Primary Emergency Contact: Ransom,[name] Address: [place], [place] [address] [place] Phone: [phone] Relation: Daughter    Allergies: Review of patient's allergies indicates no known allergies.  Chief Complaint  Patient presents with  . Medical Management of Chronic Issues    HPI: Patient is 81 y.o. male who is being seen for routine issues.  Past Medical History  Diagnosis Date  . Hypertension   . Cancer   . Rhabdomyolysis   . Dysphagia   . Symbolic dysfunction   . Fall   . Buford complex   . Dementia   . Chronic kidney disease     History reviewed. No pertinent past surgical history.    Medication List       This list is accurate as of: 09/26/14  8:36 PM.  Always use your most recent med list.               BIOFREEZE 4 % Gel  Generic drug:  Menthol (Topical Analgesic)  Apply topically. Every 8 hours as needed to affected areas     guaiFENesin 600 MG 12 hr tablet  Commonly known as:  MUCINEX  Take 600 mg by mouth 3 (three) times daily.     HYDROcodone-acetaminophen 5-325 MG per tablet  Commonly known as:  NORCO/VICODIN  Take 1 tablet by mouth every 8 (eight) hours as needed for moderate pain.     simvastatin 10 MG tablet  Commonly known as:  ZOCOR  Take 1 tablet (10 mg total) by mouth daily.        No orders of the defined types were placed in this encounter.    Immunization History  Administered Date(s) Administered  . Influenza Split 05/08/2011, 05/07/2012  . Influenza Whole 04/22/2008, 05/25/2009  . Influenza-Unspecified 05/02/2014  . Pneumococcal Polysaccharide-23 04/10/2010, 12/15/2013  . Td 04/10/2010    History  Substance Use Topics  . Smoking status: Never Smoker    . Smokeless tobacco: Not on file  . Alcohol Use: Yes     Comment: Pt binges on alcohol per pt and family    Review of Systems   Pt did not want to speak to me today;nursing report no problems    Filed Vitals:   09/26/14 1542  BP: 139/67  Pulse: 62  Temp: 97.4 F (36.3 C)  Resp: 20    Physical Exam  GENERAL APPEARANCE: Alert, non conversant,BM,  No acute distress  SKIN: No diaphoresis rash HEENT: Unremarkable RESPIRATORY: Breathing is even, unlabored. Lung sounds are clear   CARDIOVASCULAR: Heart RRR no murmurs, rubs or gallops. No peripheral edema  GASTROINTESTINAL: Abdomen is soft, non-tender, not distended w/ normal bowel sounds.  GENITOURINARY: Bladder non tender, not distended  MUSCULOSKELETAL: No abnormal joints or musculature NEUROLOGIC: Cranial nerves 2-12 grossly intact PSYCHIATRIC: some confusion, today not wanting to cooperate much  Patient Active Problem List   Diagnosis Date Noted  . Hyponatremia 02/19/2014  . Dysphagia   . HCAP (healthcare-associated pneumonia) 02/12/2014  . SIRS (systemic inflammatory response syndrome) 02/12/2014  . Severe sepsis 02/12/2014  . Hypotension 02/12/2014  . Vitamin B12 deficiency 12/14/2013  . Protein-calorie malnutrition, severe 12/13/2013  . Anemia, macrocytic 10/20/2010  . Dementia without behavioral disturbance 10/11/2009  . PROSTATE CANCER 09/25/2006  .  HYPERCHOLESTEROLEMIA 09/25/2006  . HYPERTENSION, BENIGN SYSTEMIC 09/25/2006  . CHRONIC KIDNEY DISEASE STAGE II (MILD) 09/25/2006  . Osteoarthrosis, unspecified whether generalized or localized, involving lower leg 09/25/2006    CBC    Component Value Date/Time   WBC 9.0 02/13/2014 0225   RBC 3.44* 02/13/2014 0225   HGB 11.1* 02/13/2014 0225   HCT 33.2* 02/13/2014 0225   PLT 196 02/13/2014 0225   MCV 96.5 02/13/2014 0225   LYMPHSABS 1.1 02/12/2014 0924   MONOABS 1.8* 02/12/2014 0924   EOSABS 0.0 02/12/2014 0924   BASOSABS 0.0 02/12/2014 0924    CMP      Component Value Date/Time   NA 131* 02/13/2014 0225   K 4.2 02/13/2014 0225   CL 96 02/13/2014 0225   CO2 20 02/13/2014 0225   GLUCOSE 99 02/13/2014 0225   BUN 17 02/13/2014 0225   CREATININE 0.80 02/13/2014 0225   CREATININE 1.14 09/10/2013 1640   CALCIUM 8.0* 02/13/2014 0225   PROT 7.3 02/12/2014 0033   ALBUMIN 3.3* 02/12/2014 0033   AST 37 02/12/2014 0033   ALT 30 02/12/2014 0033   ALKPHOS 106 02/12/2014 0033   BILITOT 0.9 02/12/2014 0033   GFRNONAA 82* 02/13/2014 0225   GFRAA >90 02/13/2014 0225    Assessment and Plan  Dysphagia Chronic and stable without ; plan;continue monitoringsigns or sx of aspiration   Hyponatremia Most recent Na+ 133, still lowish; have ordered repeat BMP; Plan-conmtinue monitoring   CHRONIC KIDNEY DISEASE STAGE II (MILD) 05/2014 BUN 15/Cr 0.74; has not progressed;Plan - continue to monitor.     Hennie Duos, MD

## 2014-09-26 NOTE — Assessment & Plan Note (Signed)
05/2014 BUN 15/Cr 0.74; has not progressed;Plan - continue to monitor.

## 2014-10-25 ENCOUNTER — Non-Acute Institutional Stay (SKILLED_NURSING_FACILITY): Payer: Medicare Other | Admitting: Nurse Practitioner

## 2014-10-25 DIAGNOSIS — F0390 Unspecified dementia without behavioral disturbance: Secondary | ICD-10-CM

## 2014-10-25 DIAGNOSIS — IMO0002 Reserved for concepts with insufficient information to code with codable children: Secondary | ICD-10-CM

## 2014-10-25 DIAGNOSIS — E43 Unspecified severe protein-calorie malnutrition: Secondary | ICD-10-CM | POA: Diagnosis not present

## 2014-10-25 DIAGNOSIS — N182 Chronic kidney disease, stage 2 (mild): Secondary | ICD-10-CM | POA: Diagnosis not present

## 2014-10-25 DIAGNOSIS — M1710 Unilateral primary osteoarthritis, unspecified knee: Secondary | ICD-10-CM

## 2014-10-25 DIAGNOSIS — E7800 Pure hypercholesterolemia, unspecified: Secondary | ICD-10-CM

## 2014-10-25 DIAGNOSIS — M179 Osteoarthritis of knee, unspecified: Secondary | ICD-10-CM | POA: Diagnosis not present

## 2014-10-25 NOTE — Progress Notes (Signed)
Patient ID: [name] [name], male   DOB: July 17, 1932, 81 y.o.   MRN: [id number]    Nursing Home Location:  [facility]   Place of Service: SNF (31)  PCP: Rosemarie Ax, MD  No Known Allergies  Chief Complaint  Patient presents with  . Medical Management of Chronic Issues    HPI:  Patient is a 81 y.o. male seen today at [facility] for routine follow up on chronic conditions. Pt with a PMH is significant for CKD, HLD, HTN, Prostate CA and dementia. Pt has done well in the last month. Seen by RD, supplements added due to weight. Pt alert and pleasant, has no current complaints, staff without concerns.  Review of Systems:   Review of Systems  Constitutional: Negative for activity change, appetite change, fatigue and unexpected weight change.  HENT: Negative for congestion and hearing loss.   Eyes: Negative.   Respiratory: Negative for cough and shortness of breath.   Cardiovascular: Negative for chest pain, palpitations and leg swelling.  Gastrointestinal: Negative for abdominal pain, diarrhea and constipation.  Genitourinary: Negative for dysuria and difficulty urinating.  Musculoskeletal: Positive for arthralgias. Negative for myalgias.  Skin: Negative for color change and wound.  Neurological: Negative for dizziness and weakness.  Psychiatric/Behavioral: Positive for confusion. Negative for behavioral problems and agitation.       STML    Past Medical History  Diagnosis Date  . Hypertension   . Cancer   . Rhabdomyolysis   . Dysphagia   . Symbolic dysfunction   . Fall   . Buford complex   . Dementia   . Chronic kidney disease    No past surgical history on file. Social History:   reports that he has never smoked. He does not have any smokeless tobacco history on file. He reports that he drinks alcohol. He reports that he does not use illicit drugs.  No family history on file.  Medications: Patient's Medications  New Prescriptions   No medications on file  Previous Medications   GUAIFENESIN (MUCINEX) 600 MG 12 HR TABLET    Take 600 mg by mouth 3 (three) times daily.   HYDROCODONE-ACETAMINOPHEN (NORCO/VICODIN) 5-325 MG PER TABLET    Take 1 tablet by mouth every 8 (eight) hours as needed for moderate pain.   MENTHOL, TOPICAL ANALGESIC, (BIOFREEZE) 4 % GEL    Apply topically. Every 8 hours as needed to affected areas   SIMVASTATIN (ZOCOR) 10 MG TABLET    Take 1 tablet (10 mg total) by mouth daily.  Modified Medications   No medications on file  Discontinued Medications   No medications on file     Physical Exam: Filed Vitals:   10/25/14 1516  BP: 118/72  Pulse: 68  Temp: 97.6 F (36.4 C)  Resp: 20  Weight: 131 lb (59.421 kg)    Physical Exam  Constitutional: He is oriented to person, place, and time. No distress.  HENT:  Head: Normocephalic and atraumatic.  Mouth/Throat: Oropharynx is clear and moist. No oropharyngeal exudate.  Eyes: Conjunctivae are normal. Pupils are equal, round, and reactive to light.  Neck: Normal range of motion. Neck supple.  Cardiovascular: Normal rate, regular rhythm and normal heart sounds.   Pulmonary/Chest: Effort normal and breath sounds normal.  Abdominal: Soft. Bowel sounds are normal.  Musculoskeletal: He exhibits no edema or tenderness.  Neurological: He is alert and oriented to person, place, and time.  Right sided weakness due to CVA  Skin: Skin is  warm and dry. He is not diaphoretic.  Psychiatric: He has a normal mood and affect. He exhibits abnormal recent memory and abnormal remote memory.    Labs reviewed: Basic Metabolic Panel:  Recent Labs  02/12/14 0033 02/12/14 0052 02/12/14 0924 02/13/14 0225  NA 128* 129* 131* 131*  K 4.5 4.3 4.8 4.2  CL 89* 95* 95* 96  CO2 19  --  21 20  GLUCOSE 155* 156* 104* 99  BUN '21 23 23 17  ' CREATININE 1.03 1.10 1.12 0.80  CALCIUM 9.5  --  8.2* 8.0*   Liver Function Tests:  Recent Labs  12/15/13 0655 12/29/13 1600  02/12/14 0033  AST 56* 26 37  ALT 32 20 30  ALKPHOS 61 97 106  BILITOT 0.4 0.5 0.9  PROT 5.0* 7.9 7.3  ALBUMIN 2.1* 3.6 3.3*   No results for input(s): LIPASE, AMYLASE in the last 8760 hours. No results for input(s): AMMONIA in the last 8760 hours. CBC:  Recent Labs  12/30/13 0436 02/12/14 0033 02/12/14 0052 02/12/14 0924 02/13/14 0225  WBC 7.0 3.1*  --  8.8 9.0  NEUTROABS 4.4 2.3  --  5.9  --   HGB 11.5* 15.6 17.3* 13.1 11.1*  HCT 32.5* 45.7 51.0 39.0 33.2*  MCV 96.7 97.4  --  96.8 96.5  PLT 311 275  --  191 196   TSH:  Recent Labs  12/12/13 1940  TSH 4.580*   A1C: No results found for: HGBA1C Lipid Panel: No results for input(s): CHOL, HDL, LDLCALC, TRIG, CHOLHDL, LDLDIRECT in the last 8760 hours.  CBC with Diff  Result: 02/24/2014 3:36 PM ( Status: F )   WBC7.6  4.0-10.5K/uLSLN  RBC3.75 L4.22-5.81MIL/uLSLN  Hemoglobin12.1 L13.0-17.0g/dLSLN  Hematocrit34.6 L39.0-52.0%SLN  MCV92.3  78.0-100.40fSLN  MCH32.3  26.0-34.0pgSLN  MCHC35.0  30.0-36.0g/dLSLN  RDW13.5  11.5-15.5%SLN  Platelet Count370  150-400K/uLSLN  Granulocyte %62  43-77%SLN  Absolute Gran4.7  1.7-7.7K/uLSLN  Lymph %23  12-46%SLN  Absolute Lymph1.7  0.7-4.0K/uLSLN  Mono %11   3-12%SLN  Absolute Mono0.8  0.1-1.0K/uLSLN  Eos %3  0-5%SLN  Absolute Eos0.2  0.0-0.7K/uLSLN  Baso %1  0-1%SLN  Absolute Baso0.1  0.0-0.1K/uLSLN  Smear ReviewCriteria for review not metSLN  Basic Metabolic Panel  Result: 77/10/62694:22 PM ( Status: F )   Sodium134 L135-1479m/LSLN  Potassium4.7  3.5-5.15m84mLSLN  Chloride100  96-112m615mSLN  CO228  19-32mE55mLN  Glucose95  70-99mg/41mN  BUN10  6-215mg/d28m  Creatinine0.94  0.50-1.35mg/dLSLN  Calcium8.7  8.4-10.5mg/dLS57m  Lipid Panel    Result: 05/11/2014 11:35 AM   ( Status: F )     C Cholesterol 167     0-200 mg/dL SLN C Triglyceride 57     <150 mg/dL SLN   HDL Cholesterol 36   L >39 mg/dL SLN   Total Chol/HDL Ratio 4.6      Ratio SLN   VLDL Cholesterol (Calc) 11     0-40 mg/dL SLN   LDL Cholesterol (Calc) 120   H 0-99 mg/dL SLN C Vitamin B12    Result: 05/11/2014 10:44 AM   ( Status: F )       Vitamin B12 649     211-911 pg/mL SLN   Hemoglobin A1C    Result: 05/11/2014 12:53 PM   ( Status: F )       Hemoglobin A1C 5.5     <5.7 % SLN C Estimated Average Glucose 111     <117 mg/dL SLN    CBC with Diff    Result: 06/21/2014 2:38  PM   ( Status: F )     C WBC 7.2     4.0-10.5 K/uL SLN    RBC 3.62   L 4.22-5.81 MIL/uL SLN   Hemoglobin 11.3   L 13.0-17.0 g/dL SLN   Hematocrit 33.1   L 39.0-52.0 % SLN   MCV 91.4     78.0-100.0 fL SLN   MCH 31.2     26.0-34.0 pg SLN   MCHC 34.1     30.0-36.0 g/dL SLN   RDW 13.5     11.5-15.5 % SLN   Platelet Count 302     150-400 K/uL SLN   Granulocyte % 54     43-77 % SLN   Absolute Gran 3.9     1.7-7.7 K/uL SLN   Lymph % 25     12-46 % SLN   Absolute Lymph 1.8     0.7-4.0 K/uL SLN   Mono % 11     3-12 % SLN   Absolute Mono 0.8     0.1-1.0 K/uL SLN   Eos % 10   H 0-5 % SLN   Absolute Eos 0.7     0.0-0.7 K/uL SLN   Baso % 0     0-1 % SLN   Absolute Baso 0.0     0.0-0.1 K/uL SLN   Smear Review Criteria for review not met  SLN   MPV 8.1   L 9.4-12.4 fL SLN   Comprehensive Metabolic Panel    Result: 06/21/2014 4:39 PM   ( Status: F )       Sodium 133   L 135-145 mEq/L SLN   Potassium 4.3     3.5-5.3 mEq/L SLN   Chloride 99     96-112 mEq/L SLN   CO2 28     19-32 mEq/L SLN   Glucose 98     70-99 mg/dL SLN   BUN 15     6-23 mg/dL SLN   Creatinine 0.74     0.50-1.35 mg/dL SLN   Bilirubin, Total 0.4     0.2-1.2 mg/dL SLN   Alkaline Phosphatase 75     39-117 U/L SLN   AST/SGOT 16     0-37 U/L SLN   ALT/SGPT 9     0-53 U/L SLN   Total Protein 5.9   L 6.0-8.3 g/dL SLN   Albumin 3.3   L 3.5-5.2 g/dL SLN   Calcium 8.8     8.4-10.5 mg/dL Assessment/Plan 1. Osteoarthrosis, unspecified whether generalized or localized, involving lower leg -unchanged, but manageable on current regimen  2. CHRONIC KIDNEY DISEASE STAGE II (MILD) BMP was not obtained after ordered last month, will reorder at this time  3. HYPERCHOLESTEROLEMIA -conts on Zocor, LDL was elevated in October, will follow up next month  4. Protein-calorie malnutrition, severe -weight still being followed by RD, supplements added  5. Dementia without behavioral disturbance Without acute changes to cognitive or functional status. Did not tolerate memory medications in the past.

## 2014-12-02 ENCOUNTER — Non-Acute Institutional Stay: Payer: Medicare Other | Admitting: Nurse Practitioner

## 2014-12-02 DIAGNOSIS — F0390 Unspecified dementia without behavioral disturbance: Secondary | ICD-10-CM

## 2014-12-02 DIAGNOSIS — E7800 Pure hypercholesterolemia, unspecified: Secondary | ICD-10-CM

## 2014-12-02 DIAGNOSIS — N182 Chronic kidney disease, stage 2 (mild): Secondary | ICD-10-CM | POA: Diagnosis not present

## 2014-12-02 DIAGNOSIS — IMO0002 Reserved for concepts with insufficient information to code with codable children: Secondary | ICD-10-CM

## 2014-12-02 DIAGNOSIS — I10 Essential (primary) hypertension: Secondary | ICD-10-CM

## 2014-12-02 DIAGNOSIS — M179 Osteoarthritis of knee, unspecified: Secondary | ICD-10-CM | POA: Diagnosis not present

## 2014-12-02 DIAGNOSIS — E43 Unspecified severe protein-calorie malnutrition: Secondary | ICD-10-CM | POA: Diagnosis not present

## 2014-12-02 DIAGNOSIS — M1710 Unilateral primary osteoarthritis, unspecified knee: Secondary | ICD-10-CM

## 2014-12-02 NOTE — Progress Notes (Signed)
Patient ID: [name] [name], male   DOB: [birth year], 81 y.o.   MRN: [id number]    Nursing Home Location:  [facility]   Place of Service: SNF (31)  PCP: Rosemarie Ax, MD  No Known Allergies  Chief Complaint  Patient presents with  . Medical Management of Chronic Issues    HPI:  Patient is a 81 y.o. male seen today at [facility] for routine follow up on chronic conditions. Pt with a PMH is significant for CKD, HLD, HTN, Prostate CA and dementia. Pt has done well in the last month with positive weight gain after supplements have been added. Pt remains at baseline without acute issues in the last month. Pt has no complaints today other than occasional leg/knee pain. Review of Systems:   Review of Systems  Constitutional: Negative for activity change, appetite change, fatigue and unexpected weight change.  Eyes: Negative.   Respiratory: Negative for cough and shortness of breath.   Cardiovascular: Negative for chest pain, palpitations and leg swelling.  Gastrointestinal: Negative for abdominal pain, diarrhea and constipation.  Genitourinary: Negative for dysuria.  Musculoskeletal: Positive for arthralgias. Negative for myalgias.  Skin: Negative for color change and wound.  Neurological: Negative for dizziness and weakness.  Psychiatric/Behavioral: Positive for confusion. Negative for behavioral problems and agitation.       STML    Past Medical History  Diagnosis Date  . Hypertension   . Cancer   . Rhabdomyolysis   . Dysphagia   . Symbolic dysfunction   . Fall   . Buford complex   . Dementia   . Chronic kidney disease    No past surgical history on file. Social History:   reports that he has never smoked. He does not have any smokeless tobacco history on file. He reports that he drinks alcohol. He reports that he does not use illicit drugs.  No family history on file.  Medications: Patient's Medications  New Prescriptions   No  medications on file  Previous Medications   GUAIFENESIN (MUCINEX) 600 MG 12 HR TABLET    Take 600 mg by mouth 3 (three) times daily.   HYDROCODONE-ACETAMINOPHEN (NORCO/VICODIN) 5-325 MG PER TABLET    Take 1 tablet by mouth every 8 (eight) hours as needed for moderate pain.   MENTHOL, TOPICAL ANALGESIC, (BIOFREEZE) 4 % GEL    Apply topically. Every 8 hours as needed to affected areas   SIMVASTATIN (ZOCOR) 10 MG TABLET    Take 1 tablet (10 mg total) by mouth daily.  Modified Medications   No medications on file  Discontinued Medications   No medications on file     Physical Exam: Filed Vitals:   12/02/14 1533  BP: 126/72  Pulse: 80  Temp: 98.2 F (36.8 C)  Resp: 20  Weight: 136 lb 9.6 oz (61.961 kg)    Physical Exam  Constitutional: No distress.  Thin AA male in NAD  HENT:  Head: Normocephalic and atraumatic.  Mouth/Throat: Oropharynx is clear and moist. No oropharyngeal exudate.  Eyes: Conjunctivae are normal. Pupils are equal, round, and reactive to light.  Neck: Normal range of motion. Neck supple.  Cardiovascular: Normal rate, regular rhythm and normal heart sounds.   Pulmonary/Chest: Effort normal and breath sounds normal.  Abdominal: Soft. Bowel sounds are normal.  Musculoskeletal: He exhibits no edema or tenderness.  Neurological: He is alert.  Right sided weakness due to CVA  Skin: Skin is warm and dry. He is not diaphoretic.  Psychiatric: He has a normal mood and affect. He exhibits abnormal recent memory and abnormal remote memory.    Labs reviewed: Basic Metabolic Panel:  Recent Labs  02/12/14 0033 02/12/14 0052 02/12/14 0924 02/13/14 0225  NA 128* 129* 131* 131*  K 4.5 4.3 4.8 4.2  CL 89* 95* 95* 96  CO2 19  --  21 20  GLUCOSE 155* 156* 104* 99  BUN '21 23 23 17  ' CREATININE 1.03 1.10 1.12 0.80  CALCIUM 9.5  --  8.2* 8.0*   Liver Function Tests:  Recent Labs  12/15/13 0655 12/29/13 1600 02/12/14 0033  AST 56* 26 37  ALT 32 20 30  ALKPHOS 61  97 106  BILITOT 0.4 0.5 0.9  PROT 5.0* 7.9 7.3  ALBUMIN 2.1* 3.6 3.3*   No results for input(s): LIPASE, AMYLASE in the last 8760 hours. No results for input(s): AMMONIA in the last 8760 hours. CBC:  Recent Labs  12/30/13 0436 02/12/14 0033 02/12/14 0052 02/12/14 0924 02/13/14 0225  WBC 7.0 3.1*  --  8.8 9.0  NEUTROABS 4.4 2.3  --  5.9  --   HGB 11.5* 15.6 17.3* 13.1 11.1*  HCT 32.5* 45.7 51.0 39.0 33.2*  MCV 96.7 97.4  --  96.8 96.5  PLT 311 275  --  191 196   TSH:  Recent Labs  12/12/13 1940  TSH 4.580*   A1C: No results found for: HGBA1C Lipid Panel: No results for input(s): CHOL, HDL, LDLCALC, TRIG, CHOLHDL, LDLDIRECT in the last 8760 hours.  CBC with Diff  Result: 02/24/2014 3:36 PM ( Status: F )   WBC7.6  4.0-10.5K/uLSLN  RBC3.75 L4.22-5.81MIL/uLSLN  Hemoglobin12.1 L13.0-17.0g/dLSLN  Hematocrit34.6 L39.0-52.0%SLN  MCV92.3  78.0-100.22fSLN  MCH32.3  26.0-34.0pgSLN  MCHC35.0  30.0-36.0g/dLSLN  RDW13.5  11.5-15.5%SLN  Platelet Count370  150-400K/uLSLN  Granulocyte %62  43-77%SLN  Absolute Gran4.7  1.7-7.7K/uLSLN  Lymph %23  12-46%SLN  Absolute Lymph1.7  0.7-4.0K/uLSLN  Mono %11  3-12%SLN  Absolute  Mono0.8  0.1-1.0K/uLSLN  Eos %3  0-5%SLN  Absolute Eos0.2  0.0-0.7K/uLSLN  Baso %1  0-1%SLN  Absolute Baso0.1  0.0-0.1K/uLSLN  Smear ReviewCriteria for review not metSLN  Basic Metabolic Panel  Result: 71/02/58524:22 PM ( Status: F )   Sodium134 L135-1472m/LSLN  Potassium4.7  3.5-5.85m77mLSLN  Chloride100  96-112m38mSLN  CO228  19-32mE76mLN  Glucose95  70-99mg/285mN  BUN10  6-285mg/d94m  Creatinine0.94  0.50-1.35mg/dLSLN  Calcium8.7  8.4-10.5mg/dLS71m  Lipid Panel    Result: 05/11/2014 11:35 AM   ( Status: F )     C Cholesterol 167     0-200 mg/dL SLN C Triglyceride 57     <150 mg/dL SLN   HDL Cholesterol 36   L >39 mg/dL SLN   Total Chol/HDL Ratio 4.6      Ratio SLN   VLDL Cholesterol (Calc) 11     0-40 mg/dL SLN   LDL Cholesterol (Calc) 120   H 0-99 mg/dL SLN C Vitamin B12    Result: 05/11/2014 10:44 AM   ( Status: F )       Vitamin B12 649     211-911 pg/mL SLN   Hemoglobin A1C    Result: 05/11/2014 12:53 PM   ( Status: F )       Hemoglobin A1C 5.5     <5.7 % SLN C Estimated Average Glucose 111     <117 mg/dL SLN    CBC with Diff    Result: 06/21/2014 2:38 PM   ( Status: F )  C WBC 7.2     4.0-10.5 K/uL SLN   RBC 3.62   L 4.22-5.81 MIL/uL SLN    Hemoglobin 11.3   L 13.0-17.0 g/dL SLN   Hematocrit 33.1   L 39.0-52.0 % SLN   MCV 91.4     78.0-100.0 fL SLN   MCH 31.2     26.0-34.0 pg SLN   MCHC 34.1     30.0-36.0 g/dL SLN   RDW 13.5     11.5-15.5 % SLN   Platelet Count 302     150-400 K/uL SLN   Granulocyte % 54     43-77 % SLN   Absolute Gran 3.9     1.7-7.7 K/uL SLN   Lymph % 25     12-46 % SLN   Absolute Lymph 1.8     0.7-4.0 K/uL SLN   Mono % 11     3-12 % SLN   Absolute Mono 0.8     0.1-1.0 K/uL SLN   Eos % 10   H 0-5 % SLN   Absolute Eos 0.7     0.0-0.7 K/uL SLN   Baso % 0     0-1 % SLN   Absolute Baso 0.0     0.0-0.1 K/uL SLN   Smear Review Criteria for review not met  SLN   MPV 8.1   L 9.4-12.4 fL SLN   Comprehensive Metabolic Panel    Result: 06/21/2014 4:39 PM   ( Status: F )       Sodium 133   L 135-145 mEq/L SLN   Potassium 4.3     3.5-5.3 mEq/L SLN   Chloride 99     96-112 mEq/L SLN   CO2 28     19-32 mEq/L SLN   Glucose 98     70-99 mg/dL SLN   BUN 15     6-23 mg/dL SLN   Creatinine 0.74     0.50-1.35 mg/dL SLN   Bilirubin, Total 0.4     0.2-1.2 mg/dL SLN   Alkaline Phosphatase 75     39-117 U/L SLN   AST/SGOT 16     0-37 U/L SLN   ALT/SGPT 9     0-53 U/L SLN   Total Protein 5.9   L 6.0-8.3 g/dL SLN   Albumin 3.3   L 3.5-5.2 g/dL SLN   Calcium 8.8     8.4-10.5 mg/dL Assessment/Plan 1. Osteoarthrosis, unspecified whether generalized or localized, involving lower leg -only complaint but pain manageable on current regimen with biofreeze and norco  2. CHRONIC KIDNEY DISEASE STAGE II (MILD) BMP reordered   3. HYPERCHOLESTEROLEMIA -conts on Zocor, LDL was elevated in October, will follow up lipid panel  4. Protein-calorie malnutrition, severe -weight has remained stable with slight gain, cont to be followed by RD, supplements added  5. Dementia without behavioral disturbance Without acute changes to cognitive or functional status. On no medications due to not being able to tolerate in the pass  6.  Hypertension On no medications, blood pressure controlled.

## 2015-01-02 ENCOUNTER — Encounter: Payer: Self-pay | Admitting: Internal Medicine

## 2015-01-02 ENCOUNTER — Non-Acute Institutional Stay: Payer: Medicare Other | Admitting: Internal Medicine

## 2015-01-02 DIAGNOSIS — E538 Deficiency of other specified B group vitamins: Secondary | ICD-10-CM | POA: Diagnosis not present

## 2015-01-02 DIAGNOSIS — E871 Hypo-osmolality and hyponatremia: Secondary | ICD-10-CM

## 2015-01-02 DIAGNOSIS — E7800 Pure hypercholesterolemia, unspecified: Secondary | ICD-10-CM

## 2015-01-02 DIAGNOSIS — N182 Chronic kidney disease, stage 2 (mild): Secondary | ICD-10-CM | POA: Diagnosis not present

## 2015-01-02 DIAGNOSIS — F0390 Unspecified dementia without behavioral disturbance: Secondary | ICD-10-CM

## 2015-01-02 DIAGNOSIS — D539 Nutritional anemia, unspecified: Secondary | ICD-10-CM

## 2015-01-02 NOTE — Assessment & Plan Note (Signed)
No major declines, not on any meds 2/2 intolerance ;Plan - cont monitor

## 2015-01-02 NOTE — Progress Notes (Signed)
MRN: [id number] Name: [name] [name]  Sex: male Age: 81 y.o. DOB: May 01, 1932  [place] #: Helene Kelp Facility/Room:129[facility] Level Of Care: SNF Provider: Inocencio Homes D Emergency Contacts: Extended Emergency Contact Information Primary Emergency Contact: Haliburton,[name] Address: [place], [place] [address] [place] Phone: [phone] Relation: Daughter  Code Status: FULL  Allergies: Review of patient's allergies indicates no known allergies.  Chief Complaint  Patient presents with  . Medical Management of Chronic Issues    HPI: Patient is 81 y.o. male who is being seen for routine issues.  Past Medical History  Diagnosis Date  . Hypertension   . Cancer   . Rhabdomyolysis   . Dysphagia   . Symbolic dysfunction   . Fall   . Buford complex   . Dementia   . Chronic kidney disease     History reviewed. No pertinent past surgical history.    Medication List       This list is accurate as of: 01/02/15  2:09 PM.  Always use your most recent med list.               BIOFREEZE 4 % Gel  Generic drug:  Menthol (Topical Analgesic)  Apply topically. Every 8 hours as needed to affected areas     guaiFENesin 600 MG 12 hr tablet  Commonly known as:  MUCINEX  Take 600 mg by mouth 3 (three) times daily.     HYDROcodone-acetaminophen 5-325 MG per tablet  Commonly known as:  NORCO/VICODIN  Take 1 tablet by mouth every 8 (eight) hours as needed for moderate pain.     simvastatin 10 MG tablet  Commonly known as:  ZOCOR  Take 1 tablet (10 mg total) by mouth daily.        No orders of the defined types were placed in this encounter.    Immunization History  Administered Date(s) Administered  . Influenza Split 05/08/2011, 05/07/2012  . Influenza Whole 04/22/2008, 05/25/2009  . Influenza-Unspecified 05/02/2014  . Pneumococcal Polysaccharide-23 04/10/2010, 12/15/2013  . Td 04/10/2010    History  Substance Use Topics  . Smoking  status: Never Smoker   . Smokeless tobacco: Not on file  . Alcohol Use: Yes     Comment: Pt binges on alcohol per pt and family    Review of Systems  DATA OBTAINED: from patient, nurse GENERAL:  no fevers, fatigue, appetite is ongoing problem SKIN: No itching, rash HEENT: No complaint RESPIRATORY: No cough, wheezing, SOB CARDIAC: No chest pain, palpitations, lower extremity edema  GI: No abdominal pain, No N/V/D or constipation, No heartburn or reflux  GU: No dysuria, frequency or urgency, or incontinence  MUSCULOSKELETAL: No unrelieved bone/joint pain NEUROLOGIC: No headache, dizziness  PSYCHIATRIC: No overt anxiety or sadness  Filed Vitals:   01/02/15 1353  BP: 118/88  Pulse: 88  Temp: 96.9 F (36.1 C)  Resp: 18    Physical Exam  GENERAL APPEARANCE: Alert, conversant,BM No acute distress  SKIN: No diaphoresis rash, or wounds HEENT: Unremarkable RESPIRATORY: Breathing is even, unlabored. Lung sounds are clear   CARDIOVASCULAR: Heart RRR no murmurs, rubs or gallops. No peripheral edema  GASTROINTESTINAL: Abdomen is soft, non-tender, not distended w/ normal bowel sounds.  GENITOURINARY: Bladder non tender, not distended  MUSCULOSKELETAL: No abnormal joints or musculature NEUROLOGIC: Cranial nerves 2-12 grossly intact. Moves all extremities PSYCHIATRIC: Mood and affect appropriate to situation with dementia, no behavioral issues  Patient Active Problem List  Diagnosis Date Noted  . Hyponatremia 02/19/2014  . Dysphagia   . HCAP (healthcare-associated pneumonia) 02/12/2014  . SIRS (systemic inflammatory response syndrome) 02/12/2014  . Severe sepsis 02/12/2014  . Hypotension 02/12/2014  . Vitamin B12 deficiency 12/14/2013  . Protein-calorie malnutrition, severe 12/13/2013  . Anemia, macrocytic 10/20/2010  . Dementia without behavioral disturbance 10/11/2009  . PROSTATE CANCER 09/25/2006  . HYPERCHOLESTEROLEMIA 09/25/2006  . HYPERTENSION, BENIGN SYSTEMIC 09/25/2006   . CHRONIC KIDNEY DISEASE STAGE II (MILD) 09/25/2006  . Osteoarthrosis, unspecified whether generalized or localized, involving lower leg 09/25/2006    CBC    Component Value Date/Time   WBC 9.0 02/13/2014 0225   RBC 3.44* 02/13/2014 0225   HGB 11.1* 02/13/2014 0225   HCT 33.2* 02/13/2014 0225   PLT 196 02/13/2014 0225   MCV 96.5 02/13/2014 0225   LYMPHSABS 1.1 02/12/2014 0924   MONOABS 1.8* 02/12/2014 0924   EOSABS 0.0 02/12/2014 0924   BASOSABS 0.0 02/12/2014 0924    CMP     Component Value Date/Time   NA 131* 02/13/2014 0225   K 4.2 02/13/2014 0225   CL 96 02/13/2014 0225   CO2 20 02/13/2014 0225   GLUCOSE 99 02/13/2014 0225   BUN 17 02/13/2014 0225   CREATININE 0.80 02/13/2014 0225   CREATININE 1.14 09/10/2013 1640   CALCIUM 8.0* 02/13/2014 0225   PROT 7.3 02/12/2014 0033   ALBUMIN 3.3* 02/12/2014 0033   AST 37 02/12/2014 0033   ALT 30 02/12/2014 0033   ALKPHOS 106 02/12/2014 0033   BILITOT 0.9 02/12/2014 0033   GFRNONAA 82* 02/13/2014 0225   GFRAA >90 02/13/2014 0225    Assessment and Plan  CHRONIC KIDNEY DISEASE STAGE II (MILD) In 11/2014 BUN/Cr 17/0.83 - continues stable;Plan - cont monitor at intervals   Hyponatremia In 11/2014 Na+ was 132, so stable mild hyponatremia;Plan - cont monitor at intervals   Anemia, macrocytic In 11/2014 H/H 11.6/33.6 whicj is stable;pt's B12 level was normal and B12 was d/c   HYPERCHOLESTEROLEMIA In 11/2014 on zocor 10 mg pt's LDL was 90, HDL 36, LFT's normal;plan cont zocor   Dementia without behavioral disturbance No major declines, not on any meds 2/2 intolerance ;Plan - cont monitor   Vitamin B12 deficiency B12 level was 626; d/c B12     Hennie Duos, MD

## 2015-01-02 NOTE — Assessment & Plan Note (Signed)
In 11/2014 Na+ was 132, so stable mild hyponatremia;Plan - cont monitor at intervals

## 2015-01-02 NOTE — Assessment & Plan Note (Signed)
In 11/2014 on zocor 10 mg pt's LDL was 90, HDL 36, LFT's normal;plan cont zocor

## 2015-01-02 NOTE — Assessment & Plan Note (Signed)
B12 level was 626; d/c B12

## 2015-01-02 NOTE — Assessment & Plan Note (Signed)
In 11/2014 H/H 11.6/33.6 whicj is stable;pt's B12 level was normal and B12 was d/c

## 2015-01-02 NOTE — Assessment & Plan Note (Signed)
In 11/2014 BUN/Cr 17/0.83 - continues stable;Plan - cont monitor at intervals

## 2015-02-03 ENCOUNTER — Non-Acute Institutional Stay: Payer: Medicare Other | Admitting: Nurse Practitioner

## 2015-02-03 DIAGNOSIS — E785 Hyperlipidemia, unspecified: Secondary | ICD-10-CM | POA: Diagnosis not present

## 2015-02-03 DIAGNOSIS — R634 Abnormal weight loss: Secondary | ICD-10-CM | POA: Diagnosis not present

## 2015-02-03 DIAGNOSIS — N182 Chronic kidney disease, stage 2 (mild): Secondary | ICD-10-CM | POA: Diagnosis not present

## 2015-02-03 DIAGNOSIS — D539 Nutritional anemia, unspecified: Secondary | ICD-10-CM | POA: Diagnosis not present

## 2015-02-03 DIAGNOSIS — F0390 Unspecified dementia without behavioral disturbance: Secondary | ICD-10-CM

## 2015-02-03 DIAGNOSIS — M179 Osteoarthritis of knee, unspecified: Secondary | ICD-10-CM

## 2015-02-03 DIAGNOSIS — M1710 Unilateral primary osteoarthritis, unspecified knee: Secondary | ICD-10-CM

## 2015-02-03 DIAGNOSIS — IMO0002 Reserved for concepts with insufficient information to code with codable children: Secondary | ICD-10-CM

## 2015-02-03 NOTE — Progress Notes (Signed)
Patient ID: [name] [name], male   DOB: [birth year], 81 y.o.   MRN: [id number]    Nursing Home Location:  [facility]   Place of Service: SNF (31)  PCP: Clearance Coots, MD  No Known Allergies  Chief Complaint  Patient presents with  . Medical Management of Chronic Issues    HPI:  Patient is a 81 y.o. male seen today at [facility] for routine follow up on chronic conditions. Pt with a PMH is significant for CKD, HLD, HTN, Prostate CA and dementia. Pt has had no acute illness or change in condition in the last month. Pt reports knee pain and this remains his only complaint. Pain medication helps the pain. No complaints of constipation.staff without concern at this time.  . Review of Systems:   Review of Systems  Constitutional: Negative for activity change, appetite change, fatigue and unexpected weight change.  Eyes: Negative.   Respiratory: Negative for cough and shortness of breath.   Cardiovascular: Negative for chest pain, palpitations and leg swelling.  Gastrointestinal: Negative for abdominal pain, diarrhea and constipation.  Genitourinary: Negative for dysuria.  Musculoskeletal: Positive for arthralgias. Negative for myalgias.  Skin: Negative for color change and wound.  Neurological: Negative for dizziness and weakness.  Psychiatric/Behavioral: Positive for confusion. Negative for behavioral problems and agitation.       STML    Past Medical History  Diagnosis Date  . Hypertension   . Cancer   . Rhabdomyolysis   . Dysphagia   . Symbolic dysfunction   . Fall   . Buford complex   . Dementia   . Chronic kidney disease    No past surgical history on file. Social History:   reports that he has never smoked. He does not have any smokeless tobacco history on file. He reports that he drinks alcohol. He reports that he does not use illicit drugs.  No family history on file.  Medications: Patient's Medications  New Prescriptions   No  medications on file  Previous Medications   GUAIFENESIN (MUCINEX) 600 MG 12 HR TABLET    Take 600 mg by mouth 3 (three) times daily.   HYDROCODONE-ACETAMINOPHEN (NORCO/VICODIN) 5-325 MG PER TABLET    Take 1 tablet by mouth every 8 (eight) hours as needed for moderate pain.   MENTHOL, TOPICAL ANALGESIC, (BIOFREEZE) 4 % GEL    Apply topically. Every 8 hours as needed to affected areas   SIMVASTATIN (ZOCOR) 10 MG TABLET    Take 1 tablet (10 mg total) by mouth daily.  Modified Medications   No medications on file  Discontinued Medications   No medications on file     Physical Exam: Filed Vitals:   02/03/15 1541  BP: 112/59  Pulse: 72  Temp: 97.7 F (36.5 C)  Resp: 20  Weight: 128 lb (58.06 kg)    Physical Exam  Constitutional: No distress.  Thin AA male in NAD  HENT:  Head: Normocephalic and atraumatic.  Mouth/Throat: Oropharynx is clear and moist. No oropharyngeal exudate.  Eyes: Conjunctivae are normal. Pupils are equal, round, and reactive to light.  Neck: Normal range of motion. Neck supple.  Cardiovascular: Normal rate, regular rhythm and normal heart sounds.   Pulmonary/Chest: Effort normal and breath sounds normal.  Abdominal: Soft. Bowel sounds are normal.  Musculoskeletal: He exhibits no edema or tenderness.  Neurological: He is alert.  Right sided weakness due to CVA  Skin: Skin is warm and dry. He is not diaphoretic.  Psychiatric:  He has a normal mood and affect. He exhibits abnormal recent memory and abnormal remote memory.    Labs reviewed: Basic Metabolic Panel:  Recent Labs  02/12/14 0033 02/12/14 0052 02/12/14 0924 02/13/14 0225  NA 128* 129* 131* 131*  K 4.5 4.3 4.8 4.2  CL 89* 95* 95* 96  CO2 19  --  21 20  GLUCOSE 155* 156* 104* 99  BUN _0 CREATININE 1.03 1.10 1.12 0.80  CALCIUM 9.5  --  8.2* 8.0*   Liver Function Tests:  Recent Labs  02/12/14 0033  AST 37  ALT 30  ALKPHOS 106  BILITOT 0.9  PROT 7.3  ALBUMIN 3.3*   No  results for input(s): LIPASE, AMYLASE in the last 8760 hours. No results for input(s): AMMONIA in the last 8760 hours. CBC:  Recent Labs  02/12/14 0033 02/12/14 0052 02/12/14 0924 02/13/14 0225  WBC 3.1*  --  8.8 9.0  NEUTROABS 2.3  --  5.9  --   HGB 15.6 17.3* 13.1 11.1*  HCT 45.7 51.0 39.0 33.2*  MCV 97.4  --  96.8 96.5  PLT 275  --  191 196   TSH: No results for input(s): TSH in the last 8760 hours. A1C: No results found for: HGBA1C Lipid Panel: No results for input(s): CHOL, HDL, LDLCALC, TRIG, CHOLHDL, LDLDIRECT in the last 8760 hours.  CBC with Diff  Result: 02/24/2014 3:36 PM ( Status: F )   WBC7.6  4.0-10.5K/uLSLN  RBC3.75 L4.22-5.81MIL/uLSLN  Hemoglobin12.1 L13.0-17.0g/dLSLN  Hematocrit34.6 L39.0-52.0%SLN  MCV92.3  78.0-100.7fSLN  MCH32.3  26.0-34.0pgSLN  MCHC35.0  30.0-36.0g/dLSLN  RDW13.5  11.5-15.5%SLN  Platelet Count370  150-400K/uLSLN  Granulocyte %62  43-77%SLN  Absolute Gran4.7  1.7-7.7K/uLSLN  Lymph %23  12-46%SLN  Absolute Lymph1.7  0.7-4.0K/uLSLN  Mono %11  3-12%SLN  Absolute Mono0.8  0.1-1.0K/uLSLN  Eos %3   0-5%SLN  Absolute Eos0.2  0.0-0.7K/uLSLN  Baso %1  0-1%SLN  Absolute Baso0.1  0.0-0.1K/uLSLN  Smear ReviewCriteria for review not metSLN  Basic Metabolic Panel  Result: 71/61/09604:22 PM ( Status: F )   Sodium134 L135-1474m/LSLN  Potassium4.7  3.5-5.53m76mLSLN  Chloride100  96-112m81mSLN  CO228  19-32mE38mLN  Glucose95  70-99mg/74mN  BUN10  6-253mg/d45m  Creatinine0.94  0.50-1.35mg/dLSLN  Calcium8.7  8.4-10.5mg/dLS44m  Lipid Panel    Result: 05/11/2014 11:35 AM   ( Status: F )     C Cholesterol 167     0-200 mg/dL SLN C Triglyceride 57     <150 mg/dL SLN   HDL Cholesterol 36   L >39 mg/dL SLN   Total Chol/HDL Ratio 4.6      Ratio SLN   VLDL Cholesterol (Calc) 11     0-40 mg/dL SLN   LDL Cholesterol (Calc) 120   H 0-99 mg/dL SLN C Vitamin B12    Result: 05/11/2014 10:44 AM   ( Status: F )       Vitamin B12 649     211-911 pg/mL SLN   Hemoglobin A1C    Result: 05/11/2014 12:53 PM   ( Status: F )       Hemoglobin A1C 5.5     <5.7 % SLN C Estimated Average Glucose 111     <117 mg/dL SLN    CBC with Diff    Result: 06/21/2014 2:38 PM   ( Status: F )     C WBC 7.2     4.0-10.5 K/uL SLN   RBC 3.62   L 4.22-5.81 MIL/uL SLN   Hemoglobin  11.3   L 13.0-17.0 g/dL SLN   Hematocrit 33.1   L 39.0-52.0 % SLN   MCV 91.4     78.0-100.0 fL SLN    MCH 31.2     26.0-34.0 pg SLN   MCHC 34.1     30.0-36.0 g/dL SLN   RDW 13.5     11.5-15.5 % SLN   Platelet Count 302     150-400 K/uL SLN   Granulocyte % 54     43-77 % SLN   Absolute Gran 3.9     1.7-7.7 K/uL SLN   Lymph % 25     12-46 % SLN   Absolute Lymph 1.8     0.7-4.0 K/uL SLN   Mono % 11     3-12 % SLN   Absolute Mono 0.8     0.1-1.0 K/uL SLN   Eos % 10   H 0-5 % SLN   Absolute Eos 0.7     0.0-0.7 K/uL SLN   Baso % 0     0-1 % SLN   Absolute Baso 0.0     0.0-0.1 K/uL SLN   Smear Review Criteria for review not met  SLN   MPV 8.1   L 9.4-12.4 fL SLN   Comprehensive Metabolic Panel    Result: 06/21/2014 4:39 PM   ( Status: F )       Sodium 133   L 135-145 mEq/L SLN   Potassium 4.3     3.5-5.3 mEq/L SLN   Chloride 99     96-112 mEq/L SLN   CO2 28     19-32 mEq/L SLN   Glucose 98     70-99 mg/dL SLN   BUN 15     6-23 mg/dL SLN   Creatinine 0.74     0.50-1.35 mg/dL SLN   Bilirubin, Total 0.4     0.2-1.2 mg/dL SLN   Alkaline Phosphatase 75     39-117 U/L SLN   AST/SGOT 16     0-37 U/L SLN   ALT/SGPT 9     0-53 U/L SLN   Total Protein 5.9   L 6.0-8.3 g/dL SLN   Albumin 3.3   L 3.5-5.2 g/dL SLN   Calcium 8.8     8.4-10.5 Mg/dL CMP w/ Bun Creat Ratio    Result: 12/05/2014 6:17 PM   ( Status: F )     C Sodium 132   L 135-145 mEq/L SLN   Potassium 4.6     3.5-5.3 mEq/L SLN   Chloride 97     96-112 mEq/L SLN   CO2 26     19-32 mEq/L SLN   Glucose 91     70-99 mg/dL SLN   BUN 17     6-23 mg/dL SLN   Creatinine 0.83     0.50-1.35 mg/dL SLN   Bilirubin, Total 0.6     0.2-1.2 mg/dL SLN   Alkaline Phosphatase 89     39-117 U/L SLN   AST/SGOT 16     0-37 U/L SLN   ALT/SGPT 9     0-53 U/L SLN   Total Protein 6.2     6.0-8.3 g/dL SLN   Albumin 3.2   L 3.5-5.2 g/dL SLN   Calcium 8.8     8.4-10.5 mg/dL SLN   BUN Creatinine Ratio 20.5      Ratio SLN   CBC with Diff    Result: 12/05/2014 5:51 PM   ( Status: F )  WBC 8.0     4.0-10.5 K/uL SLN   RBC 3.56   L 4.22-5.81 MIL/uL SLN    Hemoglobin 11.6   L 13.0-17.0 g/dL SLN   Hematocrit 33.6   L 39.0-52.0 % SLN   MCV 94.4     78.0-100.0 fL SLN   MCH 32.6     26.0-34.0 pg SLN   MCHC 34.5     30.0-36.0 g/dL SLN   RDW 12.7     11.5-15.5 % SLN   Platelet Count 237     150-400 K/uL SLN   MPV 8.3   L 8.6-12.4 fL SLN   Granulocyte % 65     43-77 % SLN   Absolute Gran 5.2     1.7-7.7 K/uL SLN   Lymph % 20     12-46 % SLN   Absolute Lymph 1.6     0.7-4.0 K/uL SLN   Mono % 11     3-12 % SLN   Absolute Mono 0.9     0.1-1.0 K/uL SLN   Eos % 4     0-5 % SLN   Absolute Eos 0.3     0.0-0.7 K/uL SLN   Baso % 0     0-1 % SLN   Absolute Baso 0.0     0.0-0.1 K/uL SLN   Smear Review Criteria for review not met  SLN   Lipid Panel    Result: 12/05/2014 6:17 PM   ( Status: F )       Cholesterol 135     0-200 mg/dL SLN C Triglyceride 45     <150 mg/dL SLN   HDL Cholesterol 36   L >=40 mg/dL SLN C Total Chol/HDL Ratio 3.8      Ratio SLN   VLDL Cholesterol (Calc) 9     0-40 mg/dL SLN   LDL Cholesterol (Calc) 90     0-99 mg/dL SLN   Assessment/Plan  1. Osteoarthrosis, unspecified whether generalized or localized, involving lower leg Unchanged, Pain to bilateral knees, use of Hydrocodone/apap and biofreeze PRN with relief   2. Anemia, macrocytic hgb stable  3. CHRONIC KIDNEY DISEASE STAGE II (MILD) BUN and Cr stable  4. Dementia without behavioral disturbance Pt with significant memory loss/dementia, there has been no acute change in the last month -pt doing well in current environment, noted weight loss due to dementia, and expect this to cont with the progression of the disease -did not tolerate dementia medications   5. Loss of weight weight had been stable but now is down in the last month, RD is following and pt is on supplements   6. Hyperlipidemia LDL at goal on zocor

## 2015-03-29 ENCOUNTER — Non-Acute Institutional Stay: Payer: Medicare Other | Admitting: Nurse Practitioner

## 2015-03-29 DIAGNOSIS — IMO0002 Reserved for concepts with insufficient information to code with codable children: Secondary | ICD-10-CM

## 2015-03-29 DIAGNOSIS — E785 Hyperlipidemia, unspecified: Secondary | ICD-10-CM

## 2015-03-29 DIAGNOSIS — M1710 Unilateral primary osteoarthritis, unspecified knee: Secondary | ICD-10-CM

## 2015-03-29 DIAGNOSIS — F0390 Unspecified dementia without behavioral disturbance: Secondary | ICD-10-CM

## 2015-03-29 DIAGNOSIS — M179 Osteoarthritis of knee, unspecified: Secondary | ICD-10-CM

## 2015-03-29 DIAGNOSIS — R634 Abnormal weight loss: Secondary | ICD-10-CM

## 2015-03-29 NOTE — Progress Notes (Signed)
Patient ID: [name] [name], male   DOB: 06-24-1932, 81 y.o.   MRN: [id number]    Nursing Home Location:  [facility]   Place of Service: SNF (31)  PCP: Clearance Coots, MD  No Known Allergies  Chief Complaint  Patient presents with  . Medical Management of Chronic Issues    HPI:  Patient is a 81 y.o. male seen today at [facility] for routine follow up on chronic conditions. Pt with a PMH is significant for CKD, HLD, HTN, Prostate CA and dementia. Pt has been stable in the last month, RD following weight and intake. Pain is unchanged and relieved with medication. Bowels have been stable.   Review of Systems:   Review of Systems  Constitutional: Negative for activity change, appetite change, fatigue and unexpected weight change.  Eyes: Negative.   Respiratory: Negative for cough and shortness of breath.   Cardiovascular: Negative for chest pain, palpitations and leg swelling.  Gastrointestinal: Negative for abdominal pain, diarrhea and constipation.  Genitourinary: Negative for dysuria.  Musculoskeletal: Positive for arthralgias. Negative for myalgias.  Skin: Negative for color change and wound.  Neurological: Negative for dizziness and weakness.  Psychiatric/Behavioral: Positive for confusion. Negative for behavioral problems and agitation.       STML    Past Medical History  Diagnosis Date  . Hypertension   . Cancer   . Rhabdomyolysis   . Dysphagia   . Symbolic dysfunction   . Fall   . Buford complex   . Dementia   . Chronic kidney disease    No past surgical history on file. Social History:   reports that he has never smoked. He does not have any smokeless tobacco history on file. He reports that he drinks alcohol. He reports that he does not use illicit drugs.  No family history on file.  Medications: Patient's Medications  New Prescriptions   No medications on file  Previous Medications   GUAIFENESIN (MUCINEX) 600 MG 12 HR TABLET     Take 600 mg by mouth 3 (three) times daily.   HYDROCODONE-ACETAMINOPHEN (NORCO/VICODIN) 5-325 MG PER TABLET    Take 1 tablet by mouth every 8 (eight) hours as needed for moderate pain.   MENTHOL, TOPICAL ANALGESIC, (BIOFREEZE) 4 % GEL    Apply topically. Every 8 hours as needed to affected areas   SIMVASTATIN (ZOCOR) 10 MG TABLET    Take 1 tablet (10 mg total) by mouth daily.  Modified Medications   No medications on file  Discontinued Medications   No medications on file     Physical Exam: Filed Vitals:   03/29/15 1537  BP: 98/64  Pulse: 90  Temp: 97.8 F (36.6 C)  Resp: 18  Weight: 128 lb (58.06 kg)    Physical Exam  Constitutional: No distress.  Thin AA male in NAD  HENT:  Head: Normocephalic and atraumatic.  Mouth/Throat: Oropharynx is clear and moist. No oropharyngeal exudate.  Eyes: Conjunctivae are normal. Pupils are equal, round, and reactive to light.  Neck: Normal range of motion. Neck supple.  Cardiovascular: Normal rate, regular rhythm and normal heart sounds.   Pulmonary/Chest: Effort normal and breath sounds normal.  Abdominal: Soft. Bowel sounds are normal.  Musculoskeletal: He exhibits no edema or tenderness.  Neurological: He is alert.  Right sided weakness due to CVA  Skin: Skin is warm and dry. He is not diaphoretic.  Psychiatric: He has a normal mood and affect. He exhibits abnormal recent memory and abnormal  remote memory.    Labs reviewed: Basic Metabolic Panel: No results for input(s): NA, K, CL, CO2, GLUCOSE, BUN, CREATININE, CALCIUM, MG, PHOS in the last 8760 hours. Liver Function Tests: No results for input(s): AST, ALT, ALKPHOS, BILITOT, PROT, ALBUMIN in the last 8760 hours. No results for input(s): LIPASE, AMYLASE in the last 8760 hours. No results for input(s): AMMONIA in the last 8760 hours. CBC: No results for input(s): WBC, NEUTROABS, HGB, HCT, MCV, PLT in the last 8760 hours. TSH: No results for input(s): TSH in the last 8760  hours. A1C: No results found for: HGBA1C Lipid Panel: No results for input(s): CHOL, HDL, LDLCALC, TRIG, CHOLHDL, LDLDIRECT in the last 8760 hours.  CBC with Diff  Result: 02/24/2014 3:36 PM ( Status: F )   WBC7.6  4.0-10.5K/uLSLN  RBC3.75 L4.22-5.81MIL/uLSLN  Hemoglobin12.1 L13.0-17.0g/dLSLN  Hematocrit34.6 L39.0-52.0%SLN  MCV92.3  78.0-100.59fSLN  MCH32.3  26.0-34.0pgSLN  MCHC35.0  30.0-36.0g/dLSLN  RDW13.5  11.5-15.5%SLN  Platelet Count370  150-400K/uLSLN  Granulocyte %62  43-77%SLN  Absolute Gran4.7  1.7-7.7K/uLSLN  Lymph %23  12-46%SLN  Absolute Lymph1.7  0.7-4.0K/uLSLN  Mono %11  3-12%SLN  Absolute Mono0.8  0.1-1.0K/uLSLN  Eos %3  0-5%SLN  Absolute Eos0.2  0.0-0.7K/uLSLN  Baso %1  0-1%SLN  Absolute Baso0.1  0.0-0.1K/uLSLN  Smear ReviewCriteria for review not metSLN  Basic Metabolic Panel  Result: 78/92/11944:22 PM ( Status: F  )   Sodium134 L135-1432m/LSLN  Potassium4.7  3.5-5.27m1027mLSLN  Chloride100  96-112m61mSLN  CO228  19-32mE55mLN  Glucose95  70-99mg/727mN  BUN10  6-227mg/d22m  Creatinine0.94  0.50-1.35mg/dLSLN  Calcium8.7  8.4-10.5mg/dLS49m  Lipid Panel    Result: 05/11/2014 11:35 AM   ( Status: F )     C Cholesterol 167     0-200 mg/dL SLN C Triglyceride 57     <150 mg/dL SLN   HDL Cholesterol 36   L >39 mg/dL SLN   Total Chol/HDL Ratio 4.6      Ratio SLN   VLDL Cholesterol (Calc) 11     0-40 mg/dL SLN   LDL Cholesterol (Calc) 120   H 0-99 mg/dL SLN C Vitamin B12    Result: 05/11/2014 10:44 AM   ( Status: F )       Vitamin B12 649     211-911 pg/mL SLN   Hemoglobin A1C    Result: 05/11/2014 12:53 PM   ( Status: F )       Hemoglobin A1C 5.5     <5.7 % SLN C Estimated Average Glucose 111     <117 mg/dL SLN    CBC with Diff    Result: 06/21/2014 2:38 PM   ( Status: F )     C WBC 7.2     4.0-10.5 K/uL SLN   RBC 3.62   L 4.22-5.81 MIL/uL SLN   Hemoglobin 11.3   L 13.0-17.0 g/dL SLN   Hematocrit 33.1   L 39.0-52.0 % SLN   MCV 91.4     78.0-100.0 fL SLN   MCH 31.2     26.0-34.0 pg SLN   MCHC 34.1     30.0-36.0 g/dL SLN   RDW 13.5     11.5-15.5 % SLN   Platelet Count 302     150-400 K/uL SLN   Granulocyte % 54     43-77 % SLN   Absolute Gran 3.9     1.7-7.7 K/uL SLN   Lymph % 25     12-46 % SLN   Absolute Lymph 1.8  0.7-4.0 K/uL SLN   Mono % 11     3-12 % SLN   Absolute Mono 0.8     0.1-1.0 K/uL SLN   Eos % 10   H 0-5 % SLN   Absolute Eos 0.7     0.0-0.7 K/uL SLN   Baso % 0      0-1 % SLN   Absolute Baso 0.0     0.0-0.1 K/uL SLN   Smear Review Criteria for review not met  SLN   MPV 8.1   L 9.4-12.4 fL SLN   Comprehensive Metabolic Panel    Result: 06/21/2014 4:39 PM   ( Status: F )       Sodium 133   L 135-145 mEq/L SLN   Potassium 4.3     3.5-5.3 mEq/L SLN   Chloride 99     96-112 mEq/L SLN   CO2 28     19-32 mEq/L SLN   Glucose 98     70-99 mg/dL SLN   BUN 15     6-23 mg/dL SLN   Creatinine 0.74     0.50-1.35 mg/dL SLN   Bilirubin, Total 0.4     0.2-1.2 mg/dL SLN   Alkaline Phosphatase 75     39-117 U/L SLN   AST/SGOT 16     0-37 U/L SLN   ALT/SGPT 9     0-53 U/L SLN   Total Protein 5.9   L 6.0-8.3 g/dL SLN   Albumin 3.3   L 3.5-5.2 g/dL SLN   Calcium 8.8     8.4-10.5 Mg/dL CMP w/ Bun Creat Ratio    Result: 12/05/2014 6:17 PM   ( Status: F )     C Sodium 132   L 135-145 mEq/L SLN   Potassium 4.6     3.5-5.3 mEq/L SLN   Chloride 97     96-112 mEq/L SLN   CO2 26     19-32 mEq/L SLN   Glucose 91     70-99 mg/dL SLN   BUN 17     6-23 mg/dL SLN   Creatinine 0.83     0.50-1.35 mg/dL SLN   Bilirubin, Total 0.6     0.2-1.2 mg/dL SLN   Alkaline Phosphatase 89     39-117 U/L SLN   AST/SGOT 16     0-37 U/L SLN   ALT/SGPT 9     0-53 U/L SLN   Total Protein 6.2     6.0-8.3 g/dL SLN   Albumin 3.2   L 3.5-5.2 g/dL SLN   Calcium 8.8     8.4-10.5 mg/dL SLN   BUN Creatinine Ratio 20.5      Ratio SLN   CBC with Diff    Result: 12/05/2014 5:51 PM   ( Status: F )       WBC 8.0     4.0-10.5 K/uL SLN   RBC 3.56   L 4.22-5.81 MIL/uL SLN   Hemoglobin 11.6   L 13.0-17.0 g/dL SLN   Hematocrit 33.6   L 39.0-52.0 % SLN   MCV 94.4     78.0-100.0 fL SLN   MCH 32.6     26.0-34.0 pg SLN   MCHC 34.5     30.0-36.0 g/dL SLN   RDW 12.7     11.5-15.5 % SLN   Platelet Count 237     150-400 K/uL SLN   MPV 8.3   L 8.6-12.4 fL SLN   Granulocyte % 65     43-77 %  SLN   Absolute Gran 5.2     1.7-7.7 K/uL SLN   Lymph % 20     12-46 % SLN   Absolute Lymph 1.6     0.7-4.0 K/uL SLN    Mono % 11     3-12 % SLN   Absolute Mono 0.9     0.1-1.0 K/uL SLN   Eos % 4     0-5 % SLN   Absolute Eos 0.3     0.0-0.7 K/uL SLN   Baso % 0     0-1 % SLN   Absolute Baso 0.0     0.0-0.1 K/uL SLN   Smear Review Criteria for review not met  SLN   Lipid Panel    Result: 12/05/2014 6:17 PM   ( Status: F )       Cholesterol 135     0-200 mg/dL SLN C Triglyceride 45     <150 mg/dL SLN   HDL Cholesterol 36   L >=40 mg/dL SLN C Total Chol/HDL Ratio 3.8      Ratio SLN   VLDL Cholesterol (Calc) 9     0-40 mg/dL SLN   LDL Cholesterol (Calc) 90     0-99 mg/dL SLN   Assessment/Plan  1. Osteoarthrosis, unspecified whether generalized or localized, involving lower leg -pain remains stable, pt reports he takes pain medication about once daily which helps relieve the pain. conts on HC/apap as needed  2. Dementia without behavioral disturbance Stable without acute changes in cognitive or functional status. Pt with intolerance to dementia related medications  3. Loss of weight -weight has been stable in the last month. Rd following and supplements adjusted.   4. Hyperlipidemia conts on simvastatin 10 mg daily    Andreah Goheen [name]  [facility] [phone] 8 am - 5 pm) [phone] (after hours)

## 2015-05-05 ENCOUNTER — Non-Acute Institutional Stay: Payer: Medicare Other | Admitting: Nurse Practitioner

## 2015-05-05 DIAGNOSIS — IMO0002 Reserved for concepts with insufficient information to code with codable children: Secondary | ICD-10-CM

## 2015-05-05 DIAGNOSIS — M1710 Unilateral primary osteoarthritis, unspecified knee: Secondary | ICD-10-CM

## 2015-05-05 DIAGNOSIS — E43 Unspecified severe protein-calorie malnutrition: Secondary | ICD-10-CM | POA: Diagnosis not present

## 2015-05-05 DIAGNOSIS — F0390 Unspecified dementia without behavioral disturbance: Secondary | ICD-10-CM | POA: Diagnosis not present

## 2015-05-05 DIAGNOSIS — E7800 Pure hypercholesterolemia, unspecified: Secondary | ICD-10-CM

## 2015-05-05 DIAGNOSIS — N182 Chronic kidney disease, stage 2 (mild): Secondary | ICD-10-CM | POA: Diagnosis not present

## 2015-05-05 DIAGNOSIS — M179 Osteoarthritis of knee, unspecified: Secondary | ICD-10-CM | POA: Diagnosis not present

## 2015-05-05 NOTE — Progress Notes (Signed)
Patient ID: [name] [name], male   DOB: 1932/03/30, 81 y.o.   MRN: [id number]    Nursing Home Location:  [facility]   Place of Service: SNF (31)  PCP: Clearance Coots, MD  No Known Allergies  Chief Complaint  Patient presents with  . Medical Management of Chronic Issues    HPI:  Patient is a 81 y.o. male seen today at [facility] for routine follow up on chronic conditions. Pt with a PMH is significant for CKD, HLD, HTN, Prostate CA and dementia. pts cognitive and functional status have been unchanged in the last month. Pt reports pain in knees are only complaint but relieved by PRN medication. Pt feels like he has a good appetite. Pt denies constipation, increased urinary frequency or dysuria.  No shortness of breath or chest pains. Staff without acute concerns   Review of Systems:   Review of Systems  Constitutional: Negative for activity change, appetite change, fatigue and unexpected weight change.  Eyes: Negative.   Respiratory: Negative for cough and shortness of breath.   Cardiovascular: Negative for chest pain, palpitations and leg swelling.  Gastrointestinal: Negative for abdominal pain, diarrhea and constipation.  Genitourinary: Negative for dysuria.  Musculoskeletal: Positive for arthralgias. Negative for myalgias.  Skin: Negative for color change and wound.  Neurological: Negative for dizziness and weakness.  Psychiatric/Behavioral: Positive for confusion. Negative for behavioral problems and agitation.       STML    Past Medical History  Diagnosis Date  . Hypertension   . Cancer   . Rhabdomyolysis   . Dysphagia   . Symbolic dysfunction   . Fall   . Buford complex   . Dementia   . Chronic kidney disease    No past surgical history on file. Social History:   reports that he has never smoked. He does not have any smokeless tobacco history on file. He reports that he drinks alcohol. He reports that he does not use illicit  drugs.  No family history on file.  Medications: Patient's Medications  New Prescriptions   No medications on file  Previous Medications   GUAIFENESIN (MUCINEX) 600 MG 12 HR TABLET    Take 600 mg by mouth 3 (three) times daily.   HYDROCODONE-ACETAMINOPHEN (NORCO/VICODIN) 5-325 MG PER TABLET    Take 1 tablet by mouth every 8 (eight) hours as needed for moderate pain.   MENTHOL, TOPICAL ANALGESIC, (BIOFREEZE) 4 % GEL    Apply topically. Every 8 hours as needed to affected areas   SIMVASTATIN (ZOCOR) 10 MG TABLET    Take 1 tablet (10 mg total) by mouth daily.  Modified Medications   No medications on file  Discontinued Medications   No medications on file     Physical Exam: Filed Vitals:   05/05/15 1510  BP: 102/63  Pulse: 80  Temp: 98.1 F (36.7 C)  Resp: 18  Weight: 130 lb (58.968 kg)    Physical Exam  Constitutional: No distress.  Thin AA male in NAD  HENT:  Head: Normocephalic and atraumatic.  Mouth/Throat: Oropharynx is clear and moist. No oropharyngeal exudate.  Eyes: Conjunctivae are normal. Pupils are equal, round, and reactive to light.  Neck: Normal range of motion. Neck supple.  Cardiovascular: Normal rate, regular rhythm and normal heart sounds.   Pulmonary/Chest: Effort normal and breath sounds normal.  Abdominal: Soft. Bowel sounds are normal.  Musculoskeletal: He exhibits no edema or tenderness.  Neurological: He is alert.  Right sided weakness due  to CVA  Skin: Skin is warm and dry. He is not diaphoretic.  Psychiatric: He has a normal mood and affect. He exhibits abnormal recent memory and abnormal remote memory.    Labs reviewed: Basic Metabolic Panel: No results for input(s): NA, K, CL, CO2, GLUCOSE, BUN, CREATININE, CALCIUM, MG, PHOS in the last 8760 hours. Liver Function Tests: No results for input(s): AST, ALT, ALKPHOS, BILITOT, PROT, ALBUMIN in the last 8760 hours. No results for input(s): LIPASE, AMYLASE in the last 8760 hours. No results for  input(s): AMMONIA in the last 8760 hours. CBC: No results for input(s): WBC, NEUTROABS, HGB, HCT, MCV, PLT in the last 8760 hours. TSH: No results for input(s): TSH in the last 8760 hours. A1C: No results found for: HGBA1C Lipid Panel: No results for input(s): CHOL, HDL, LDLCALC, TRIG, CHOLHDL, LDLDIRECT in the last 8760 hours.  CBC with Diff  Result: 02/24/2014 3:36 PM ( Status: F )   WBC7.6  4.0-10.5K/uLSLN  RBC3.75 L4.22-5.81MIL/uLSLN  Hemoglobin12.1 L13.0-17.0g/dLSLN  Hematocrit34.6 L39.0-52.0%SLN  MCV92.3  78.0-100.18fLSLN  MCH32.3  26.0-34.0pgSLN  MCHC35.0  30.0-36.0g/dLSLN  RDW13.5  11.5-15.5%SLN  Platelet Count370  150-400K/uLSLN  Granulocyte %62  43-77%SLN  Absolute Gran4.7  1.7-7.7K/uLSLN  Lymph %23  12-46%SLN  Absolute Lymph1.7  0.7-4.0K/uLSLN  Mono %11  3-12%SLN  Absolute Mono0.8  0.1-1.0K/uLSLN  Eos %3  0-5%SLN  Absolute Eos0.2  0.0-0.7K/uLSLN  Baso %1  0-1%SLN  Absolute Baso0.1   0.0-0.1K/uLSLN  Smear ReviewCriteria for review not metSLN  Basic Metabolic Panel  Result: 1/75/1025 4:22 PM ( Status: F )   Sodium134 L135-16mEq/LSLN  Potassium4.7  3.5-5.52mEq/LSLN  Chloride100  96-169mEq/LSLN  CO228  19-51mEq/LSLN  Glucose95  70-99mg /dLSLN  BUN10  6-23mg /dLSLN  Creatinine0.94  0.50-1.35mg /dLSLN  Calcium8.7  8.4-10.5mg /dLSLN   Lipid Panel    Result: 05/11/2014 11:35 AM   ( Status: F )     C Cholesterol 167     0-200 mg/dL SLN C Triglyceride 57     <150 mg/dL SLN   HDL Cholesterol 36   L >39 mg/dL SLN   Total Chol/HDL Ratio 4.6      Ratio SLN   VLDL Cholesterol (Calc) 11     0-40 mg/dL SLN   LDL Cholesterol (Calc) 120   H 0-99 mg/dL SLN C Vitamin B12    Result: 05/11/2014 10:44 AM   ( Status: F )       Vitamin B12 649     211-911 pg/mL SLN   Hemoglobin A1C    Result: 05/11/2014 12:53 PM   ( Status: F )       Hemoglobin A1C 5.5     <5.7 % SLN C Estimated Average Glucose 111     <117 mg/dL SLN    CBC with Diff    Result: 06/21/2014 2:38 PM   ( Status: F )     C WBC 7.2     4.0-10.5 K/uL SLN   RBC 3.62   L 4.22-5.81 MIL/uL SLN   Hemoglobin 11.3   L 13.0-17.0 g/dL SLN   Hematocrit 33.1   L 39.0-52.0 % SLN   MCV 91.4     78.0-100.0 fL SLN   MCH 31.2     26.0-34.0 pg SLN   MCHC 34.1     30.0-36.0 g/dL SLN   RDW 13.5     11.5-15.5 % SLN   Platelet Count 302     150-400 K/uL SLN   Granulocyte % 54     43-77 % SLN   Absolute  Gran 3.9     1.7-7.7 K/uL SLN   Lymph % 25      12-46 % SLN   Absolute Lymph 1.8     0.7-4.0 K/uL SLN   Mono % 11     3-12 % SLN   Absolute Mono 0.8     0.1-1.0 K/uL SLN   Eos % 10   H 0-5 % SLN   Absolute Eos 0.7     0.0-0.7 K/uL SLN   Baso % 0     0-1 % SLN   Absolute Baso 0.0     0.0-0.1 K/uL SLN   Smear Review Criteria for review not met  SLN   MPV 8.1   L 9.4-12.4 fL SLN   Comprehensive Metabolic Panel    Result: 06/21/2014 4:39 PM   ( Status: F )       Sodium 133   L 135-145 mEq/L SLN   Potassium 4.3     3.5-5.3 mEq/L SLN   Chloride 99     96-112 mEq/L SLN   CO2 28     19-32 mEq/L SLN   Glucose 98     70-99 mg/dL SLN   BUN 15     6-23 mg/dL SLN   Creatinine 0.74     0.50-1.35 mg/dL SLN   Bilirubin, Total 0.4     0.2-1.2 mg/dL SLN   Alkaline Phosphatase 75     39-117 U/L SLN   AST/SGOT 16     0-37 U/L SLN   ALT/SGPT 9     0-53 U/L SLN   Total Protein 5.9   L 6.0-8.3 g/dL SLN   Albumin 3.3   L 3.5-5.2 g/dL SLN   Calcium 8.8     8.4-10.5 Mg/dL CMP w/ Bun Creat Ratio    Result: 12/05/2014 6:17 PM   ( Status: F )     C Sodium 132   L 135-145 mEq/L SLN   Potassium 4.6     3.5-5.3 mEq/L SLN   Chloride 97     96-112 mEq/L SLN   CO2 26     19-32 mEq/L SLN   Glucose 91     70-99 mg/dL SLN   BUN 17     6-23 mg/dL SLN   Creatinine 0.83     0.50-1.35 mg/dL SLN   Bilirubin, Total 0.6     0.2-1.2 mg/dL SLN   Alkaline Phosphatase 89     39-117 U/L SLN   AST/SGOT 16     0-37 U/L SLN   ALT/SGPT 9     0-53 U/L SLN   Total Protein 6.2     6.0-8.3 g/dL SLN   Albumin 3.2   L 3.5-5.2 g/dL SLN   Calcium 8.8     8.4-10.5 mg/dL SLN   BUN Creatinine Ratio 20.5      Ratio SLN   CBC with Diff    Result: 12/05/2014 5:51 PM   ( Status: F )       WBC 8.0     4.0-10.5 K/uL SLN   RBC 3.56   L 4.22-5.81 MIL/uL SLN   Hemoglobin 11.6   L 13.0-17.0 g/dL SLN   Hematocrit 33.6   L 39.0-52.0 % SLN   MCV 94.4     78.0-100.0 fL SLN   MCH 32.6     26.0-34.0 pg SLN   MCHC 34.5     30.0-36.0 g/dL SLN   RDW 12.7     11.5-15.5 % SLN   Platelet  Count 237      150-400 K/uL SLN   MPV 8.3   L 8.6-12.4 fL SLN   Granulocyte % 65     43-77 % SLN   Absolute Gran 5.2     1.7-7.7 K/uL SLN   Lymph % 20     12-46 % SLN   Absolute Lymph 1.6     0.7-4.0 K/uL SLN   Mono % 11     3-12 % SLN   Absolute Mono 0.9     0.1-1.0 K/uL SLN   Eos % 4     0-5 % SLN   Absolute Eos 0.3     0.0-0.7 K/uL SLN   Baso % 0     0-1 % SLN   Absolute Baso 0.0     0.0-0.1 K/uL SLN   Smear Review Criteria for review not met  SLN   Lipid Panel    Result: 12/05/2014 6:17 PM   ( Status: F )       Cholesterol 135     0-200 mg/dL SLN C Triglyceride 45     <150 mg/dL SLN   HDL Cholesterol 36   L >=40 mg/dL SLN C Total Chol/HDL Ratio 3.8      Ratio SLN   VLDL Cholesterol (Calc) 9     0-40 mg/dL SLN   LDL Cholesterol (Calc) 90     0-99 mg/dL SLN   Assessment/Plan 1. Osteoarthrosis, unspecified whether generalized or localized, involving lower leg Pain well controlled on current regimen, conts to use biofreeze and norco as needed.   2. Dementia without behavioral disturbance Without significant cognitive or functional decline in the lat month   3. HYPERCHOLESTEROLEMIA LDL stable at 90 in May, conts on Zocor  4. CHRONIC KIDNEY DISEASE STAGE II (MILD) BUN/Cr stable in May, will monitor  5. Protein-calorie malnutrition, severe ([place]) -weight has been stable, RD following and supplements being given.     Carlos American. Harle Battiest  [facility] [phone] 8 am - 5 pm) [phone] (after hours)

## 2015-06-09 ENCOUNTER — Non-Acute Institutional Stay: Payer: Medicare Other | Admitting: Nurse Practitioner

## 2015-06-09 DIAGNOSIS — N182 Chronic kidney disease, stage 2 (mild): Secondary | ICD-10-CM | POA: Diagnosis not present

## 2015-06-09 DIAGNOSIS — M179 Osteoarthritis of knee, unspecified: Secondary | ICD-10-CM | POA: Diagnosis not present

## 2015-06-09 DIAGNOSIS — D638 Anemia in other chronic diseases classified elsewhere: Secondary | ICD-10-CM

## 2015-06-09 DIAGNOSIS — IMO0002 Reserved for concepts with insufficient information to code with codable children: Secondary | ICD-10-CM

## 2015-06-09 DIAGNOSIS — E43 Unspecified severe protein-calorie malnutrition: Secondary | ICD-10-CM | POA: Diagnosis not present

## 2015-06-09 DIAGNOSIS — M1710 Unilateral primary osteoarthritis, unspecified knee: Secondary | ICD-10-CM

## 2015-06-09 DIAGNOSIS — E7800 Pure hypercholesterolemia, unspecified: Secondary | ICD-10-CM | POA: Diagnosis not present

## 2015-06-09 NOTE — Progress Notes (Signed)
Patient ID: [name] [name], male   DOB: Dec 26, 1931, 81 y.o.   MRN: [id number]    Nursing Home Location:  [place] of Service: SNF (31)  PCP: Gildardo Cranker, DO  No Known Allergies  Chief Complaint  Patient presents with  . Medical Management of Chronic Issues    HPI:  Patient is a 81 y.o. male seen today at [facility] for routine follow up on chronic conditions. Pt with a PMH is significant for CKD, HLD, HTN, Prostate CA and dementia. pts cognitive and functional status have been unchanged in the last month. Pt reports he is not out of bed yet because someone has taken his wheelchair. Enjoys getting up and self propelling around the facility. Mood has been stable. Weight stable, no changes to diet or appetite. Nursing without concerns.   Review of Systems:   Review of Systems  Constitutional: Negative for activity change, appetite change, fatigue and unexpected weight change.  Eyes: Negative.   Respiratory: Negative for cough and shortness of breath.   Cardiovascular: Negative for chest pain, palpitations and leg swelling.  Gastrointestinal: Negative for abdominal pain, diarrhea and constipation.  Genitourinary: Negative for dysuria.  Musculoskeletal: Positive for arthralgias. Negative for myalgias.  Skin: Negative for color change and wound.  Neurological: Negative for dizziness and weakness.  Psychiatric/Behavioral: Positive for confusion. Negative for behavioral problems and agitation.       STML    Past Medical History  Diagnosis Date  . Hypertension   . Cancer   . Rhabdomyolysis   . Dysphagia   . Symbolic dysfunction   . Fall   . Buford complex   . Dementia   . Chronic kidney disease    No past surgical history on file. Social History:   reports that he has never smoked. He does not have any smokeless tobacco history on file. He reports that he drinks alcohol. He reports that he does not use illicit drugs.  No family  history on file.  Medications: Patient's Medications  New Prescriptions   No medications on file  Previous Medications   GUAIFENESIN (MUCINEX) 600 MG 12 HR TABLET    Take 600 mg by mouth 3 (three) times daily.   HYDROCODONE-ACETAMINOPHEN (NORCO/VICODIN) 5-325 MG PER TABLET    Take 1 tablet by mouth every 8 (eight) hours as needed for moderate pain.   MENTHOL, TOPICAL ANALGESIC, (BIOFREEZE) 4 % GEL    Apply topically. Every 8 hours as needed to affected areas   SIMVASTATIN (ZOCOR) 10 MG TABLET    Take 1 tablet (10 mg total) by mouth daily.  Modified Medications   No medications on file  Discontinued Medications   No medications on file     Physical Exam: Filed Vitals:   06/09/15 1446  BP: 110/74  Pulse: 63  Temp: 97 F (36.1 C)  Resp: 20  Weight: 131 lb (59.421 kg)    Physical Exam  Constitutional: No distress.  Thin AA male in NAD  HENT:  Head: Normocephalic and atraumatic.  Mouth/Throat: Oropharynx is clear and moist. No oropharyngeal exudate.  Eyes: Conjunctivae are normal. Pupils are equal, round, and reactive to light.  Neck: Normal range of motion. Neck supple.  Cardiovascular: Normal rate, regular rhythm and normal heart sounds.   Pulmonary/Chest: Effort normal and breath sounds normal.  Abdominal: Soft. Bowel sounds are normal.  Musculoskeletal: He exhibits no edema or tenderness.  Neurological: He is alert.  Right sided weakness due to CVA  Skin: Skin is warm and dry. He is not diaphoretic.  Psychiatric: He has a normal mood and affect. He exhibits abnormal recent memory and abnormal remote memory.    Labs reviewed: Basic Metabolic Panel: No results for input(s): NA, K, CL, CO2, GLUCOSE, BUN, CREATININE, CALCIUM, MG, PHOS in the last 8760 hours. Liver Function Tests: No results for input(s): AST, ALT, ALKPHOS, BILITOT, PROT, ALBUMIN in the last 8760 hours. No results for input(s): LIPASE, AMYLASE in the last 8760 hours. No results for input(s): AMMONIA in  the last 8760 hours. CBC: No results for input(s): WBC, NEUTROABS, HGB, HCT, MCV, PLT in the last 8760 hours. TSH: No results for input(s): TSH in the last 8760 hours. A1C: No results found for: HGBA1C Lipid Panel: No results for input(s): CHOL, HDL, LDLCALC, TRIG, CHOLHDL, LDLDIRECT in the last 8760 hours.  CBC with Diff  Result: 02/24/2014 3:36 PM ( Status: F )   WBC7.6  4.0-10.5K/uLSLN  RBC3.75 L4.22-5.81MIL/uLSLN  Hemoglobin12.1 L13.0-17.0g/dLSLN  Hematocrit34.6 L39.0-52.0%SLN  MCV92.3  78.0-100.54fSLN  MCH32.3  26.0-34.0pgSLN  MCHC35.0  30.0-36.0g/dLSLN  RDW13.5  11.5-15.5%SLN  Platelet Count370  150-400K/uLSLN  Granulocyte %62  43-77%SLN  Absolute Gran4.7  1.7-7.7K/uLSLN  Lymph %23  12-46%SLN  Absolute Lymph1.7  0.7-4.0K/uLSLN  Mono %11  3-12%SLN  Absolute Mono0.8  0.1-1.0K/uLSLN  Eos %3  0-5%SLN  Absolute Eos0.2  0.0-0.7K/uLSLN  Baso %1  0-1%SLN  Absolute Baso0.1  0.0-0.1K/uLSLN   Smear ReviewCriteria for review not metSLN  Basic Metabolic Panel  Result: 73/30/07624:22 PM ( Status: F )   Sodium134 L135-1468m/LSLN  Potassium4.7  3.5-5.77m74mLSLN  Chloride100  96-112m52mSLN  CO228  19-32mE48mLN  Glucose95  70-99mg/90mN  BUN10  6-277mg/d177m  Creatinine0.94  0.50-1.35mg/dLSLN  Calcium8.7  8.4-10.5mg/dLS67m  Lipid Panel    Result: 05/11/2014 11:35 AM   ( Status: F )     C Cholesterol 167     0-200 mg/dL SLN C Triglyceride 57     <150 mg/dL SLN   HDL Cholesterol 36   L >39 mg/dL SLN   Total Chol/HDL Ratio 4.6      Ratio SLN   VLDL Cholesterol (Calc) 11     0-40 mg/dL SLN   LDL Cholesterol (Calc) 120   H 0-99 mg/dL SLN C Vitamin B12    Result: 05/11/2014 10:44 AM   ( Status: F )       Vitamin B12 649     211-911 pg/mL SLN   Hemoglobin A1C    Result: 05/11/2014 12:53 PM   ( Status: F )       Hemoglobin A1C 5.5     <5.7 % SLN C Estimated Average Glucose 111     <117 mg/dL SLN    CBC with Diff    Result: 06/21/2014 2:38 PM   ( Status: F )     C WBC 7.2     4.0-10.5 K/uL SLN   RBC 3.62   L 4.22-5.81 MIL/uL SLN   Hemoglobin 11.3   L 13.0-17.0 g/dL SLN   Hematocrit 33.1   L 39.0-52.0 % SLN   MCV 91.4     78.0-100.0 fL SLN   MCH 31.2     26.0-34.0 pg SLN   MCHC 34.1     30.0-36.0 g/dL SLN   RDW 13.5     11.5-15.5 % SLN   Platelet Count 302     150-400 K/uL SLN   Granulocyte % 54     43-77 % SLN   Absolute Gran 3.9  1.7-7.7 K/uL SLN   Lymph % 25     12-46 % SLN   Absolute Lymph 1.8      0.7-4.0 K/uL SLN   Mono % 11     3-12 % SLN   Absolute Mono 0.8     0.1-1.0 K/uL SLN   Eos % 10   H 0-5 % SLN   Absolute Eos 0.7     0.0-0.7 K/uL SLN   Baso % 0     0-1 % SLN   Absolute Baso 0.0     0.0-0.1 K/uL SLN   Smear Review Criteria for review not met  SLN   MPV 8.1   L 9.4-12.4 fL SLN   Comprehensive Metabolic Panel    Result: 06/21/2014 4:39 PM   ( Status: F )       Sodium 133   L 135-145 mEq/L SLN   Potassium 4.3     3.5-5.3 mEq/L SLN   Chloride 99     96-112 mEq/L SLN   CO2 28     19-32 mEq/L SLN   Glucose 98     70-99 mg/dL SLN   BUN 15     6-23 mg/dL SLN   Creatinine 0.74     0.50-1.35 mg/dL SLN   Bilirubin, Total 0.4     0.2-1.2 mg/dL SLN   Alkaline Phosphatase 75     39-117 U/L SLN   AST/SGOT 16     0-37 U/L SLN   ALT/SGPT 9     0-53 U/L SLN   Total Protein 5.9   L 6.0-8.3 g/dL SLN   Albumin 3.3   L 3.5-5.2 g/dL SLN   Calcium 8.8     8.4-10.5 Mg/dL CMP w/ Bun Creat Ratio    Result: 12/05/2014 6:17 PM   ( Status: F )     C Sodium 132   L 135-145 mEq/L SLN   Potassium 4.6     3.5-5.3 mEq/L SLN   Chloride 97     96-112 mEq/L SLN   CO2 26     19-32 mEq/L SLN   Glucose 91     70-99 mg/dL SLN   BUN 17     6-23 mg/dL SLN   Creatinine 0.83     0.50-1.35 mg/dL SLN   Bilirubin, Total 0.6     0.2-1.2 mg/dL SLN   Alkaline Phosphatase 89     39-117 U/L SLN   AST/SGOT 16     0-37 U/L SLN   ALT/SGPT 9     0-53 U/L SLN   Total Protein 6.2     6.0-8.3 g/dL SLN   Albumin 3.2   L 3.5-5.2 g/dL SLN   Calcium 8.8     8.4-10.5 mg/dL SLN   BUN Creatinine Ratio 20.5      Ratio SLN   CBC with Diff    Result: 12/05/2014 5:51 PM   ( Status: F )       WBC 8.0     4.0-10.5 K/uL SLN   RBC 3.56   L 4.22-5.81 MIL/uL SLN   Hemoglobin 11.6   L 13.0-17.0 g/dL SLN   Hematocrit 33.6   L 39.0-52.0 % SLN   MCV 94.4     78.0-100.0 fL SLN   MCH 32.6     26.0-34.0 pg SLN   MCHC 34.5     30.0-36.0 g/dL SLN   RDW 12.7     11.5-15.5 % SLN   Platelet Count 237  150-400 K/uL SLN   MPV 8.3    L 8.6-12.4 fL SLN   Granulocyte % 65     43-77 % SLN   Absolute Gran 5.2     1.7-7.7 K/uL SLN   Lymph % 20     12-46 % SLN   Absolute Lymph 1.6     0.7-4.0 K/uL SLN   Mono % 11     3-12 % SLN   Absolute Mono 0.9     0.1-1.0 K/uL SLN   Eos % 4     0-5 % SLN   Absolute Eos 0.3     0.0-0.7 K/uL SLN   Baso % 0     0-1 % SLN   Absolute Baso 0.0     0.0-0.1 K/uL SLN   Smear Review Criteria for review not met  SLN   Lipid Panel    Result: 12/05/2014 6:17 PM   ( Status: F )       Cholesterol 135     0-200 mg/dL SLN C Triglyceride 45     <150 mg/dL SLN   HDL Cholesterol 36   L >=40 mg/dL SLN C Total Chol/HDL Ratio 3.8      Ratio SLN   VLDL Cholesterol (Calc) 9     0-40 mg/dL SLN   LDL Cholesterol (Calc) 90     0-99 mg/dL SLN   Assessment/Plan 1. Osteoarthrosis, unspecified whether generalized or localized, involving lower leg -stable, pain controlled on current regimen  2. CHRONIC KIDNEY DISEASE STAGE II (MILD) -will follow up CMP, avoiding NSAID and dehydration  3. Protein-calorie malnutrition, severe ([place]) -pt reports good intake, weight has been stable, no trouble swallowing or pain in mouth. Will monitor albumin level  4. HYPERCHOLESTEROLEMIA -LDL at goal of 90 in may, conts on zocor  5. Anemia of chronic disease -will follow up [place]. Harle Battiest  [facility] [phone] 8 am - 5 pm) [phone] (after hours)

## 2015-06-10 LAB — BASIC METABOLIC PANEL
BUN: 14 mg/dL (ref 4–21)
Creatinine: 0.7 mg/dL (ref 0.6–1.3)
Glucose: 83 mg/dL
POTASSIUM: 4.3 mmol/L (ref 3.4–5.3)
SODIUM: 134 mmol/L — AB (ref 137–147)

## 2015-06-10 LAB — HEPATIC FUNCTION PANEL
ALT: 12 U/L (ref 10–40)
AST: 19 U/L (ref 14–40)
Alkaline Phosphatase: 79 U/L (ref 25–125)

## 2015-08-04 ENCOUNTER — Non-Acute Institutional Stay: Payer: Medicare Other | Admitting: Nurse Practitioner

## 2015-08-04 DIAGNOSIS — N182 Chronic kidney disease, stage 2 (mild): Secondary | ICD-10-CM | POA: Diagnosis not present

## 2015-08-04 DIAGNOSIS — E43 Unspecified severe protein-calorie malnutrition: Secondary | ICD-10-CM

## 2015-08-04 DIAGNOSIS — F0390 Unspecified dementia without behavioral disturbance: Secondary | ICD-10-CM

## 2015-08-04 DIAGNOSIS — M179 Osteoarthritis of knee, unspecified: Secondary | ICD-10-CM | POA: Diagnosis not present

## 2015-08-04 DIAGNOSIS — M1710 Unilateral primary osteoarthritis, unspecified knee: Secondary | ICD-10-CM

## 2015-08-04 DIAGNOSIS — IMO0002 Reserved for concepts with insufficient information to code with codable children: Secondary | ICD-10-CM

## 2015-08-04 NOTE — Progress Notes (Signed)
Patient ID: Rafferty Postlewait, male   DOB: 1932/05/13, 82 y.o.   MRN: [id number]    Nursing Home Location:  [place] of Service: SNF (31)  PCP: Gildardo Cranker, DO  No Known Allergies  Chief Complaint  Patient presents with  . Medical Management of Chronic Issues    HPI:  Patient is a 82 y.o. male seen today at [facility] for routine follow up on chronic conditions. Pt with a PMH is significant for CKD, HLD, HTN, Prostate CA and dementia. pts cognitive and functional status have been unchanged in the last month. Pt without changes in appetite. Denies anxiety or depression. Denies worsening pain. Staff without concerns at this time.   Review of Systems:   Review of Systems  Constitutional: Negative for activity change, appetite change, fatigue and unexpected weight change.  Eyes: Negative.   Respiratory: Negative for cough and shortness of breath.   Cardiovascular: Negative for chest pain, palpitations and leg swelling.  Gastrointestinal: Negative for abdominal pain, diarrhea and constipation.  Genitourinary: Negative for dysuria.  Musculoskeletal: Positive for arthralgias. Negative for myalgias.  Skin: Negative for color change and wound.  Neurological: Negative for dizziness and weakness.  Psychiatric/Behavioral: Positive for confusion. Negative for behavioral problems and agitation.       STML    Past Medical History  Diagnosis Date  . Hypertension   . Cancer   . Rhabdomyolysis   . Dysphagia   . Symbolic dysfunction   . Fall   . Buford complex   . Dementia   . Chronic kidney disease    No past surgical history on file. Social History:   reports that he has never smoked. He does not have any smokeless tobacco history on file. He reports that he drinks alcohol. He reports that he does not use illicit drugs.  No family history on file.  Medications: Patient's Medications  New Prescriptions   No medications on file  Previous  Medications   GUAIFENESIN (MUCINEX) 600 MG 12 HR TABLET    Take 600 mg by mouth 3 (three) times daily.   HYDROCODONE-ACETAMINOPHEN (NORCO/VICODIN) 5-325 MG PER TABLET    Take 1 tablet by mouth every 8 (eight) hours as needed for moderate pain.   MENTHOL, TOPICAL ANALGESIC, (BIOFREEZE) 4 % GEL    Apply topically. Every 8 hours as needed to affected areas   SIMVASTATIN (ZOCOR) 10 MG TABLET    Take 1 tablet (10 mg total) by mouth daily.  Modified Medications   No medications on file  Discontinued Medications   No medications on file     Physical Exam: Filed Vitals:   08/04/15 1526  BP: 114/78  Pulse: 78  Temp: 98 F (36.7 C)  Resp: 16  Weight: 134 lb (60.782 kg)    Physical Exam  Constitutional: No distress.  Thin AA male in NAD  HENT:  Head: Normocephalic and atraumatic.  Mouth/Throat: Oropharynx is clear and moist. No oropharyngeal exudate.  Eyes: Conjunctivae are normal. Pupils are equal, round, and reactive to light.  Neck: Normal range of motion. Neck supple.  Cardiovascular: Normal rate, regular rhythm and normal heart sounds.   Pulmonary/Chest: Effort normal and breath sounds normal.  Abdominal: Soft. Bowel sounds are normal.  Musculoskeletal: He exhibits no edema or tenderness.  Neurological: He is alert.  Right sided weakness due to CVA  Skin: Skin is warm and dry. He is not diaphoretic.  Psychiatric: He has a normal mood and affect. He  exhibits abnormal recent memory and abnormal remote memory.    Labs reviewed: Basic Metabolic Panel:  Recent Labs  06/10/15  NA 134*  K 4.3  BUN 14  CREATININE 0.7   Liver Function Tests:  Recent Labs  06/10/15  AST 19  ALT 12  ALKPHOS 79   No results for input(s): LIPASE, AMYLASE in the last 8760 hours. No results for input(s): AMMONIA in the last 8760 hours. CBC: No results for input(s): WBC, NEUTROABS, HGB, HCT, MCV, PLT in the last 8760 hours. TSH: No results for input(s): TSH in the last 8760 hours. A1C: No  results found for: HGBA1C Lipid Panel: No results for input(s): CHOL, HDL, LDLCALC, TRIG, CHOLHDL, LDLDIRECT in the last 8760 hours.  CBC with Diff  Result: 02/24/2014 3:36 PM ( Status: F )   WBC7.6  4.0-10.5K/uLSLN  RBC3.75 L4.22-5.81MIL/uLSLN  Hemoglobin12.1 L13.0-17.0g/dLSLN  Hematocrit34.6 L39.0-52.0%SLN  MCV92.3  78.0-100.75fSLN  MCH32.3  26.0-34.0pgSLN  MCHC35.0  30.0-36.0g/dLSLN  RDW13.5  11.5-15.5%SLN  Platelet Count370  150-400K/uLSLN  Granulocyte %62  43-77%SLN  Absolute Gran4.7  1.7-7.7K/uLSLN  Lymph %23  12-46%SLN  Absolute Lymph1.7  0.7-4.0K/uLSLN  Mono %11  3-12%SLN  Absolute Mono0.8  0.1-1.0K/uLSLN  Eos %3  0-5%SLN  Absolute Eos0.2  0.0-0.7K/uLSLN  Baso %1  0-1%SLN  Absolute Baso0.1  0.0-0.1K/uLSLN  Smear ReviewCriteria for review not metSLN  Basic Metabolic Panel  Result: 75/85/27784:22 PM ( Status: F )    Sodium134 L135-1416m/LSLN  Potassium4.7  3.5-5.48m26mLSLN  Chloride100  96-112m80mSLN  CO228  19-32mE4mLN  Glucose95  70-99mg/68mN  BUN10  6-248mg/d46m  Creatinine0.94  0.50-1.35mg/dLSLN  Calcium8.7  8.4-10.5mg/dLS45m  Lipid Panel    Result: 05/11/2014 11:35 AM   ( Status: F )     C Cholesterol 167     0-200 mg/dL SLN C Triglyceride 57     <150 mg/dL SLN   HDL Cholesterol 36   L >39 mg/dL SLN   Total Chol/HDL Ratio 4.6      Ratio SLN   VLDL Cholesterol (Calc) 11     0-40 mg/dL SLN   LDL Cholesterol (Calc) 120   H 0-99 mg/dL SLN C Vitamin B12    Result: 05/11/2014 10:44 AM   ( Status: F )       Vitamin B12 649     211-911 pg/mL SLN   Hemoglobin A1C    Result: 05/11/2014 12:53 PM   ( Status: F )       Hemoglobin A1C 5.5     <5.7 % SLN C Estimated Average Glucose 111     <117 mg/dL SLN    CBC with Diff    Result: 06/21/2014 2:38 PM   ( Status: F )     C WBC 7.2     4.0-10.5 K/uL SLN   RBC 3.62   L 4.22-5.81 MIL/uL SLN   Hemoglobin 11.3   L 13.0-17.0 g/dL SLN   Hematocrit 33.1   L 39.0-52.0 % SLN   MCV 91.4     78.0-100.0 fL SLN   MCH 31.2     26.0-34.0 pg SLN   MCHC 34.1     30.0-36.0 g/dL SLN   RDW 13.5     11.5-15.5 % SLN   Platelet Count 302     150-400 K/uL SLN   Granulocyte % 54     43-77 % SLN   Absolute Gran 3.9     1.7-7.7 K/uL SLN   Lymph % 25     12-46 % SLN  Absolute Lymph 1.8     0.7-4.0 K/uL SLN   Mono % 11     3-12 % SLN   Absolute Mono 0.8     0.1-1.0 K/uL SLN   Eos % 10   H 0-5 % SLN   Absolute Eos 0.7     0.0-0.7 K/uL SLN   Baso % 0     0-1 % SLN    Absolute Baso 0.0     0.0-0.1 K/uL SLN   Smear Review Criteria for review not met  SLN   MPV 8.1   L 9.4-12.4 fL SLN   Comprehensive Metabolic Panel    Result: 06/21/2014 4:39 PM   ( Status: F )       Sodium 133   L 135-145 mEq/L SLN   Potassium 4.3     3.5-5.3 mEq/L SLN   Chloride 99     96-112 mEq/L SLN   CO2 28     19-32 mEq/L SLN   Glucose 98     70-99 mg/dL SLN   BUN 15     6-23 mg/dL SLN   Creatinine 0.74     0.50-1.35 mg/dL SLN   Bilirubin, Total 0.4     0.2-1.2 mg/dL SLN   Alkaline Phosphatase 75     39-117 U/L SLN   AST/SGOT 16     0-37 U/L SLN   ALT/SGPT 9     0-53 U/L SLN   Total Protein 5.9   L 6.0-8.3 g/dL SLN   Albumin 3.3   L 3.5-5.2 g/dL SLN   Calcium 8.8     8.4-10.5 Mg/dL CMP w/ Bun Creat Ratio    Result: 12/05/2014 6:17 PM   ( Status: F )     C Sodium 132   L 135-145 mEq/L SLN   Potassium 4.6     3.5-5.3 mEq/L SLN   Chloride 97     96-112 mEq/L SLN   CO2 26     19-32 mEq/L SLN   Glucose 91     70-99 mg/dL SLN   BUN 17     6-23 mg/dL SLN   Creatinine 0.83     0.50-1.35 mg/dL SLN   Bilirubin, Total 0.6     0.2-1.2 mg/dL SLN   Alkaline Phosphatase 89     39-117 U/L SLN   AST/SGOT 16     0-37 U/L SLN   ALT/SGPT 9     0-53 U/L SLN   Total Protein 6.2     6.0-8.3 g/dL SLN   Albumin 3.2   L 3.5-5.2 g/dL SLN   Calcium 8.8     8.4-10.5 mg/dL SLN   BUN Creatinine Ratio 20.5      Ratio SLN   CBC with Diff    Result: 12/05/2014 5:51 PM   ( Status: F )       WBC 8.0     4.0-10.5 K/uL SLN   RBC 3.56   L 4.22-5.81 MIL/uL SLN   Hemoglobin 11.6   L 13.0-17.0 g/dL SLN   Hematocrit 33.6   L 39.0-52.0 % SLN   MCV 94.4     78.0-100.0 fL SLN   MCH 32.6     26.0-34.0 pg SLN   MCHC 34.5     30.0-36.0 g/dL SLN   RDW 12.7     11.5-15.5 % SLN   Platelet Count 237     150-400 K/uL SLN   MPV 8.3   L 8.6-12.4 fL SLN   Granulocyte %  65     43-77 % SLN   Absolute Gran 5.2     1.7-7.7 K/uL SLN   Lymph % 20     12-46 % SLN   Absolute Lymph 1.6     0.7-4.0 K/uL SLN   Mono % 11      3-12 % SLN   Absolute Mono 0.9     0.1-1.0 K/uL SLN   Eos % 4     0-5 % SLN   Absolute Eos 0.3     0.0-0.7 K/uL SLN   Baso % 0     0-1 % SLN   Absolute Baso 0.0     0.0-0.1 K/uL SLN   Smear Review Criteria for review not met  SLN   Lipid Panel    Result: 12/05/2014 6:17 PM   ( Status: F )       Cholesterol 135     0-200 mg/dL SLN C Triglyceride 45     <150 mg/dL SLN   HDL Cholesterol 36   L >=40 mg/dL SLN C Total Chol/HDL Ratio 3.8      Ratio SLN   VLDL Cholesterol (Calc) 9     0-40 mg/dL SLN   LDL Cholesterol (Calc) 90     0-99 mg/dL SLN   Assessment/Plan 1. Osteoarthrosis, unspecified whether generalized or localized, involving lower leg Stable, pt with adequate pain relief at this time. conts on current regimen.   2. CHRONIC KIDNEY DISEASE STAGE II (MILD) BUN/Cr stable on recent labs, encouraged hydration  3. Protein-calorie malnutrition, severe (HCC) Weight remains stable, total protein on recent labs of 6.2, conts on supplements   4. Dementia without behavioral disturbance Memory remains stable, without acute cognitive or functional decline noted      Ermias Tomeo [name]  [facility] [phone] 8 am - 5 pm) [phone] (after hours)

## 2015-09-08 ENCOUNTER — Non-Acute Institutional Stay: Payer: Medicare Other | Admitting: Nurse Practitioner

## 2015-09-08 DIAGNOSIS — F0390 Unspecified dementia without behavioral disturbance: Secondary | ICD-10-CM | POA: Diagnosis not present

## 2015-09-08 DIAGNOSIS — IMO0002 Reserved for concepts with insufficient information to code with codable children: Secondary | ICD-10-CM

## 2015-09-08 DIAGNOSIS — E7800 Pure hypercholesterolemia, unspecified: Secondary | ICD-10-CM | POA: Diagnosis not present

## 2015-09-08 DIAGNOSIS — N182 Chronic kidney disease, stage 2 (mild): Secondary | ICD-10-CM

## 2015-09-08 DIAGNOSIS — E43 Unspecified severe protein-calorie malnutrition: Secondary | ICD-10-CM | POA: Diagnosis not present

## 2015-09-08 DIAGNOSIS — M179 Osteoarthritis of knee, unspecified: Secondary | ICD-10-CM

## 2015-09-08 DIAGNOSIS — M1710 Unilateral primary osteoarthritis, unspecified knee: Secondary | ICD-10-CM

## 2015-09-08 DIAGNOSIS — D539 Nutritional anemia, unspecified: Secondary | ICD-10-CM | POA: Diagnosis not present

## 2015-09-08 NOTE — Progress Notes (Signed)
Patient ID: [name] [name], male   DOB: 1932-06-06, 82 y.o.   MRN: [id number]    Nursing Home Location:  [place] of Service: SNF (31)  PCP: Gildardo Cranker, DO  No Known Allergies  Chief Complaint  Patient presents with  . Medical Management of Chronic Issues    HPI:  Patient is a 82 y.o. male seen today at [facility] for routine follow up on chronic conditions. Pt with a PMH is significant for CKD, HLD, HTN, Prostate CA and dementia. pts cognitive and functional status have been unchanged in the last month. Pt very pleasant in bed. Does not have concern. Reports only issue is pain in left leg due to arthritis and weakness after CVA. Reports good mood, sleeping well. No issues with swallowing, pain in mouth or throat. Weight loss noted. Pt eating potato chips prior to visit.  Staff without acute concerns.   Review of Systems:   Review of Systems  Constitutional: Negative for activity change, appetite change, fatigue and unexpected weight change.  Eyes: Negative.   Respiratory: Negative for cough and shortness of breath.   Cardiovascular: Negative for chest pain, palpitations and leg swelling.  Gastrointestinal: Negative for abdominal pain, diarrhea and constipation.  Genitourinary: Negative for dysuria.  Musculoskeletal: Positive for arthralgias. Negative for myalgias.  Skin: Negative for color change and wound.  Neurological: Negative for dizziness and weakness.  Psychiatric/Behavioral: Positive for confusion. Negative for behavioral problems and agitation.       STML    Past Medical History  Diagnosis Date  . Hypertension   . Cancer   . Rhabdomyolysis   . Dysphagia   . Symbolic dysfunction   . Fall   . Buford complex   . Dementia   . Chronic kidney disease    No past surgical history on file. Social History:   reports that he has never smoked. He does not have any smokeless tobacco history on file. He reports that he drinks  alcohol. He reports that he does not use illicit drugs.  No family history on file.  Medications: Patient's Medications  New Prescriptions   No medications on file  Previous Medications   GUAIFENESIN (MUCINEX) 600 MG 12 HR TABLET    Take 600 mg by mouth 3 (three) times daily.   HYDROCODONE-ACETAMINOPHEN (NORCO/VICODIN) 5-325 MG PER TABLET    Take 1 tablet by mouth every 8 (eight) hours as needed for moderate pain.   MENTHOL, TOPICAL ANALGESIC, (BIOFREEZE) 4 % GEL    Apply topically. Every 8 hours as needed to affected areas   SIMVASTATIN (ZOCOR) 10 MG TABLET    Take 1 tablet (10 mg total) by mouth daily.  Modified Medications   No medications on file  Discontinued Medications   No medications on file     Physical Exam: Filed Vitals:   09/08/15 1604  BP: 124/59  Pulse: 60  Temp: 97.2 F (36.2 C)  Resp: 20  Weight: 128 lb (58.06 kg)    Physical Exam  Constitutional: No distress.  Thin AA male in NAD  HENT:  Head: Normocephalic and atraumatic.  Mouth/Throat: Oropharynx is clear and moist. No oropharyngeal exudate.  Eyes: Conjunctivae are normal. Pupils are equal, round, and reactive to light.  Neck: Normal range of motion. Neck supple.  Cardiovascular: Normal rate, regular rhythm and normal heart sounds.   Pulmonary/Chest: Effort normal and breath sounds normal.  Abdominal: Soft. Bowel sounds are normal.  Musculoskeletal: He exhibits no edema  or tenderness.  Neurological: He is alert.  Right sided weakness due to CVA  Skin: Skin is warm and dry. He is not diaphoretic.  Psychiatric: He has a normal mood and affect. He exhibits abnormal recent memory and abnormal remote memory.    Labs reviewed: Basic Metabolic Panel:  Recent Labs  06/10/15  NA 134*  K 4.3  BUN 14  CREATININE 0.7   Liver Function Tests:  Recent Labs  06/10/15  AST 19  ALT 12  ALKPHOS 79   No results for input(s): LIPASE, AMYLASE in the last 8760 hours. No results for input(s): AMMONIA in  the last 8760 hours. CBC: No results for input(s): WBC, NEUTROABS, HGB, HCT, MCV, PLT in the last 8760 hours. TSH: No results for input(s): TSH in the last 8760 hours. A1C: No results found for: HGBA1C Lipid Panel: No results for input(s): CHOL, HDL, LDLCALC, TRIG, CHOLHDL, LDLDIRECT in the last 8760 hours.   Assessment/Plan 1. Osteoarthrosis, unspecified whether generalized or localized, involving lower leg -remains stable, pain medication controlling symptoms.   2. CHRONIC KIDNEY DISEASE STAGE II (MILD) BUN/Cr stable on recent labs.   3. Protein-calorie malnutrition, severe ([place]) Weight loss noted in the last month. Pt denies decrease in appetite. With progression of dementia weight loss is expected as disease progresses. conts on supplement and staff support.   4. Anemia, macrocytic -will follow up CBC at this time  5. HYPERCHOLESTEROLEMIA -remains on zocor, will follow up fasting lipids.   6. Dementia without behavioral disturbance -dementia remains with slow decline, unable to tolerate dementia related medications.      Carlos American. Harle Battiest  [facility] [phone] 8 am - 5 pm) [phone] (after hours)

## 2015-09-11 LAB — LIPID PANEL
Cholesterol: 140 mg/dL (ref 0–200)
HDL: 31 mg/dL — AB (ref 35–70)
LDL Cholesterol: 94 mg/dL
Triglycerides: 74 mg/dL (ref 40–160)

## 2015-10-13 ENCOUNTER — Non-Acute Institutional Stay (SKILLED_NURSING_FACILITY): Payer: Medicare Other | Admitting: Adult Health

## 2015-10-13 ENCOUNTER — Encounter: Payer: Self-pay | Admitting: Adult Health

## 2015-10-13 DIAGNOSIS — M179 Osteoarthritis of knee, unspecified: Secondary | ICD-10-CM

## 2015-10-13 DIAGNOSIS — M1710 Unilateral primary osteoarthritis, unspecified knee: Secondary | ICD-10-CM

## 2015-10-13 DIAGNOSIS — D539 Nutritional anemia, unspecified: Secondary | ICD-10-CM | POA: Diagnosis not present

## 2015-10-13 DIAGNOSIS — F0390 Unspecified dementia without behavioral disturbance: Secondary | ICD-10-CM

## 2015-10-13 DIAGNOSIS — E7800 Pure hypercholesterolemia, unspecified: Secondary | ICD-10-CM | POA: Diagnosis not present

## 2015-10-13 DIAGNOSIS — I6990 Unspecified sequelae of unspecified cerebrovascular disease: Secondary | ICD-10-CM

## 2015-10-13 DIAGNOSIS — I10 Essential (primary) hypertension: Secondary | ICD-10-CM | POA: Diagnosis not present

## 2015-10-13 DIAGNOSIS — IMO0002 Reserved for concepts with insufficient information to code with codable children: Secondary | ICD-10-CM

## 2015-10-13 DIAGNOSIS — N182 Chronic kidney disease, stage 2 (mild): Secondary | ICD-10-CM | POA: Diagnosis not present

## 2015-10-13 NOTE — Progress Notes (Signed)
Patient ID: [name] [name], male   DOB: 1932/05/23, 82 y.o.   MRN: [id number]   Facility: [facility]       No Known Allergies  Chief Complaint  Patient presents with  . Medical Management of Chronic Issues    Follow up    HPI:  He is a long term resident of this facility being seen for the management of his chronic illnesses. Overall his status is without change his weight is 131 pounds. He does have pain in his right leg from his cva. There are no nursing concerns at this time.   Past Medical History  Diagnosis Date  . Hypertension   . Cancer ([place])   . Rhabdomyolysis   . Dysphagia   . Symbolic dysfunction   . Fall   . Buford complex   . Dementia   . Chronic kidney disease     No past surgical history on file.  VITAL SIGNS BP 139/77 mmHg  Pulse 79  Temp(Src) 98 F (36.7 C) (Oral)  Resp 18  Ht 5\' 11"  (1.803 m)  Wt 131 lb (59.421 kg)  BMI 18.28 kg/m2  Patient's Medications  New Prescriptions   No medications on file  Previous Medications  GUAIFENESIN (MUCINEX) 600 MG 12 HR TABLET    Take 600 mg by mouth 3 (three) times daily.  HYDROCODONE-ACETAMINOPHEN (NORCO/VICODIN) 5-325 MG PER TABLET    Take 1 tablet by mouth every 8 (eight) hours as needed for moderate pain.  MENTHOL, TOPICAL ANALGESIC, (BIOFREEZE) 4 % GEL    Apply topically. Every 8 hours as needed to affected areas  SIMVASTATIN (ZOCOR) 10 MG TABLET    Take 1 tablet (10 mg total) by mouth daily.    Modified Medications   No medications on file  Discontinued Medications     SIGNIFICANT DIAGNOSTIC EXAMS  LABS REVIEWED:   06-10-15: wbc 6.3; hgb 13.0; hct 37.6 ;mcv 95.8; plt 205; glucose 86; bun 14; creat 0.7; k+ 4.3; na++134; liver normal albumin 3.2 09-11-15: chol 140; ldl 94; trig 74; hdl 31    Review of Systems  Constitutional: Negative for malaise/fatigue.  Respiratory: Negative for cough and shortness of breath.   Cardiovascular: Negative for chest pain, palpitations and leg swelling.    Gastrointestinal: Negative for heartburn, abdominal pain and constipation.  Musculoskeletal: Positive for myalgias. Negative for back pain.       Has chronic right leg pain; is managed   Skin: Negative.   Neurological: Negative for dizziness.  Psychiatric/Behavioral: The patient is not nervous/anxious.     Physical Exam  Constitutional: No distress.  Eyes: Conjunctivae are normal.  Neck: Neck supple. No JVD present. No thyromegaly present.  Cardiovascular: Normal rate, regular rhythm and intact distal pulses.   Respiratory: Effort normal and breath sounds normal. No respiratory distress. He has no wheezes.  GI: Soft. Bowel sounds are normal. He exhibits no distension. There is no tenderness.  Musculoskeletal: He exhibits no edema.  Has right side weakness   Lymphadenopathy:    He has no cervical adenopathy.  Neurological: He is alert.  Skin: Skin is warm and dry. He is not diaphoretic.  Psychiatric: He has a normal mood and affect.       ASSESSMENT/ PLAN:  1. Dyslipidemia: will continue zocor 10 mg daily ldl is 94  2. CVA: has right side weakness  is neurologically stable; is presently not on medications; will monitor  3. Hypertension: is not on medications; will monitor  4. Osteoarthritis: is stable will continue vicodin 5/325  mg every 8 hours as needed for pain   5. Anemia: hgb is 13.0; will monitor  6. CKD stage II: bun/creat 29/1.4 will monitor  7. Dementia: no significant change in status; his weight is 131 pounds. He is presently not on medications will monitor    Time spent with patient  45  minutes >50% time spent counseling; reviewing medical record; tests; labs; and developing future plan of care   Ok Edwards NP [facility]  Contact [phone] Monday through Friday 8am- 5pm  After hours call [phone]

## 2015-10-14 DIAGNOSIS — I6990 Unspecified sequelae of unspecified cerebrovascular disease: Secondary | ICD-10-CM | POA: Insufficient documentation

## 2015-11-13 ENCOUNTER — Encounter: Payer: Self-pay | Admitting: Internal Medicine

## 2015-11-13 ENCOUNTER — Non-Acute Institutional Stay (SKILLED_NURSING_FACILITY): Payer: Medicare Other | Admitting: Internal Medicine

## 2015-11-13 DIAGNOSIS — I10 Essential (primary) hypertension: Secondary | ICD-10-CM

## 2015-11-13 DIAGNOSIS — I6990 Unspecified sequelae of unspecified cerebrovascular disease: Secondary | ICD-10-CM

## 2015-11-13 DIAGNOSIS — F0390 Unspecified dementia without behavioral disturbance: Secondary | ICD-10-CM

## 2015-11-13 DIAGNOSIS — N182 Chronic kidney disease, stage 2 (mild): Secondary | ICD-10-CM | POA: Diagnosis not present

## 2015-11-13 DIAGNOSIS — E43 Unspecified severe protein-calorie malnutrition: Secondary | ICD-10-CM | POA: Diagnosis not present

## 2015-11-13 DIAGNOSIS — M179 Osteoarthritis of knee, unspecified: Secondary | ICD-10-CM

## 2015-11-13 DIAGNOSIS — E785 Hyperlipidemia, unspecified: Secondary | ICD-10-CM | POA: Diagnosis not present

## 2015-11-13 DIAGNOSIS — M1710 Unilateral primary osteoarthritis, unspecified knee: Secondary | ICD-10-CM

## 2015-11-13 DIAGNOSIS — IMO0002 Reserved for concepts with insufficient information to code with codable children: Secondary | ICD-10-CM

## 2015-11-13 NOTE — Progress Notes (Signed)
Patient ID: Whitten Arney, male   DOB: 09-02-1931, 82 y.o.   MRN: [id number]    DATE: 11/13/15  Location:  [facility] [place] Room Number: 129 B Place of Service: SNF (31)   Extended Emergency Contact Information Primary Emergency Contact: Brenning,[name] Address: [address] [place], [place] [address] [place] Phone: [phone] Relation: Daughter  Advanced Directive information Does patient have an advance directive?: No, Would patient like information on creating an advanced directive?: No - patient declined information  Chief Complaint  Patient presents with  . Medical Management of Chronic Issues    Routine Visit    HPI:  82 yo male long term resident seen today for f/u. He has no concerns today. No nursing issues. No falls. He is a poor historian due to dementia. Hx obtained from chart  Dyslipidemia - stable on zocor 10 mg daily. 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 Stable without meds  Hypertension - diet controlled  Osteoarthritis - pain stable on vicodin 5/325 mg every 8 hours as needed   Hx Anemia - stable. Hgb 13.0  CKD - stage II with Cr 0.7  Dementia - weight stable at 131 lbs. He does not take any meds   Past Medical History  Diagnosis Date  . Hypertension   . Cancer ([place])   . Rhabdomyolysis   . Dysphagia   . Symbolic dysfunction   . Fall   . Buford complex   . Dementia   . Chronic kidney disease     History reviewed. No pertinent past surgical history.  Patient Care Team: Gildardo Cranker, DO as PCP - General (Internal Medicine) [place] ([place])  Social History   Social History  . Marital Status: Single    Spouse Name: N/A  . Number of Children: N/A  . Years of Education: N/A   Occupational History  . Not on file.   Social History Main Topics  . Smoking status: Never Smoker   . Smokeless tobacco: Not on file  . Alcohol Use: Yes     Comment:  Pt binges on alcohol per pt and family  . Drug Use: No  . Sexual Activity: No   Other Topics Concern  . Not on file   Social History Narrative     reports that he has never smoked. He does not have any smokeless tobacco history on file. He reports that he drinks alcohol. He reports that he does not use illicit drugs.  Immunization History  Administered Date(s) Administered  . Influenza Split 05/08/2011, 05/07/2012  . Influenza Whole 04/22/2008, 05/25/2009  . Influenza-Unspecified 05/02/2014, 05/10/2015  . Pneumococcal Polysaccharide-23 04/10/2010, 12/15/2013  . Td 04/10/2010    No Known Allergies  Medications: Patient's Medications  New Prescriptions   No medications on file  Previous Medications   HYDROCODONE-ACETAMINOPHEN (NORCO/VICODIN) 5-325 MG PER TABLET    Take 1 tablet by mouth every 8 (eight) hours as needed for moderate pain.   LOPERAMIDE (IMODIUM) 2 MG CAPSULE    Give 1 tablet po after each loose stool PRN. Do not exceed 8mg  per 24 hour.   MENTHOL, TOPICAL ANALGESIC, (BIOFREEZE) 4 % GEL    Apply topically. Every 8 hours as needed to affected areas   SIMVASTATIN (ZOCOR) 10 MG TABLET    Take 1 tablet (10 mg total) by mouth daily.  Modified Medications   No medications on file  Discontinued Medications   No medications on file    Review of Systems  Unable to perform ROS: Dementia    Filed Vitals:   11/13/15 1013  BP: 109/59  Pulse: 70  Temp: 98.6 F (37 C)  TempSrc: Oral  Resp: 16  Height: 5\' 11"  (1.803 m)  Weight: 131 lb 12.8 oz (59.784 kg)   Body mass index is 18.39 kg/(m^2).  Physical Exam  Constitutional: He appears well-developed.  Frail appearing in NAD, lying in bed  HENT:  Mouth/Throat: Oropharynx is clear and moist.  No teeth   Eyes: Pupils are equal, round, and reactive to light. No scleral icterus.  Neck: Neck supple. Carotid bruit is not present.  Cardiovascular: Normal rate, regular rhythm, normal heart sounds and intact distal  pulses.  Exam reveals no gallop and no friction rub.   No murmur heard. no distal LE swelling. No calf TTP  Pulmonary/Chest: Effort normal and breath sounds normal. He has no wheezes. He has no rales. He exhibits no tenderness.  Abdominal: Soft. Bowel sounds are normal. He exhibits no distension, no abdominal bruit, no pulsatile midline mass and no mass. There is no tenderness. There is no rebound and no guarding.  Musculoskeletal: He exhibits edema (small and large joint deformities).  Lymphadenopathy:    He has no cervical adenopathy.  Neurological: He is alert.  Right hemiparesis  Skin: Skin is warm and dry. No rash noted.  Psychiatric: He has a normal mood and affect. His behavior is normal.     Labs reviewed: Nursing Home on 10/13/2015  Component Date Value Ref Range Status  . Triglycerides 09/11/2015 74  40 - 160 mg/dL Final  . Cholesterol 09/11/2015 140  0 - 200 mg/dL Final  . HDL 09/11/2015 31* 35 - 70 mg/dL Final  . LDL Cholesterol 09/11/2015 94   Final    No results found.   Assessment/Plan   ICD-9-CM ICD-10-CM   1. Dementia without behavioral disturbance 294.20 F03.90   2. Osteoarthrosis, unspecified whether generalized or localized, involving lower leg 715.96 M17.9   3. Protein-calorie malnutrition, severe ([place]) 262 E43   4. HYPERTENSION, BENIGN SYSTEMIC 401.1 I10   5. Hyperlipidemia 272.4 E78.5   6. Late effects of CVA (cerebrovascular accident) 438.9 I69.90   7. CHRONIC KIDNEY DISEASE STAGE II (MILD) 585.2 N18.2     Pt is medically stable on current tx plan. Continue current medications as ordered. PT/OT/ST as indicated. Will follow  Iria Jamerson S. Perlie Gold  [facility] [address] [place],  [phone] Cell (Monday-Friday 8 AM - 5 PM) [phone] After 5 PM and follow prompts

## 2015-11-17 DIAGNOSIS — E785 Hyperlipidemia, unspecified: Secondary | ICD-10-CM | POA: Insufficient documentation

## 2015-12-11 ENCOUNTER — Non-Acute Institutional Stay: Payer: Medicare Other | Admitting: Internal Medicine

## 2015-12-11 ENCOUNTER — Encounter: Payer: Self-pay | Admitting: Internal Medicine

## 2015-12-11 DIAGNOSIS — E43 Unspecified severe protein-calorie malnutrition: Secondary | ICD-10-CM | POA: Diagnosis not present

## 2015-12-11 DIAGNOSIS — I10 Essential (primary) hypertension: Secondary | ICD-10-CM

## 2015-12-11 DIAGNOSIS — I6990 Unspecified sequelae of unspecified cerebrovascular disease: Secondary | ICD-10-CM | POA: Diagnosis not present

## 2015-12-11 DIAGNOSIS — F0390 Unspecified dementia without behavioral disturbance: Secondary | ICD-10-CM | POA: Diagnosis not present

## 2015-12-11 DIAGNOSIS — D638 Anemia in other chronic diseases classified elsewhere: Secondary | ICD-10-CM

## 2015-12-11 DIAGNOSIS — M179 Osteoarthritis of knee, unspecified: Secondary | ICD-10-CM | POA: Diagnosis not present

## 2015-12-11 DIAGNOSIS — E785 Hyperlipidemia, unspecified: Secondary | ICD-10-CM | POA: Diagnosis not present

## 2015-12-11 DIAGNOSIS — E538 Deficiency of other specified B group vitamins: Secondary | ICD-10-CM | POA: Diagnosis not present

## 2015-12-11 DIAGNOSIS — M1710 Unilateral primary osteoarthritis, unspecified knee: Secondary | ICD-10-CM

## 2015-12-11 DIAGNOSIS — I6930 Unspecified sequelae of cerebral infarction: Secondary | ICD-10-CM

## 2015-12-11 DIAGNOSIS — IMO0002 Reserved for concepts with insufficient information to code with codable children: Secondary | ICD-10-CM

## 2015-12-11 NOTE — Progress Notes (Signed)
DATE: 12/11/15  Location:  [facility]  Nursing Home Room Number: 129-B Place of Service: SNF (31)   Extended Emergency Contact Information Primary Emergency Contact: Mara,[name] Address: [place], [place] [address] [place] Phone: [phone] Relation: Daughter  Advanced Directive information Does patient have an advance directive?: No, Would patient like information on creating an advanced directive?: No - patient declined information FULL CODE Chief Complaint  Patient presents with  . Medical Management of Chronic Issues    HPI:  82 yo male long term resident seen today for f/u. He c/o stiffness in right foot and ankle. No other concerns. No nursing issues. No falls. He is a poor historian due to dementia. Hx obtained form chart  Dyslipidemia - stable on zocor 10 mg daily. 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 Stable without meds  Hypertension - diet controlled  Osteoarthritis - pain stable on vicodin 5/325 mg every 8 hours as needed   Hx Anemia - stable. Hgb 13.0. Hx B12 deficiency  CKD - stage II with Cr 0.7  Dementia - weight stable at 132 lbs. He does not take any meds     Past Medical History  Diagnosis Date  . Hypertension   . Cancer ([place])   . Rhabdomyolysis   . Dysphagia   . Symbolic dysfunction   . Fall   . Buford complex   . Dementia   . Chronic kidney disease     History reviewed. No pertinent past surgical history.  Patient Care Team: Gildardo Cranker, DO as PCP - General (Internal Medicine) [place] ([place])  Social History   Social History  . Marital Status: Single    Spouse Name: N/A  . Number of Children: N/A  . Years of Education: N/A   Occupational History  . Not on file.   Social History Main Topics  . Smoking status: Never Smoker   . Smokeless tobacco: Not on file  . Alcohol Use: Yes     Comment: Pt binges on alcohol per pt  and family  . Drug Use: No  . Sexual Activity: No   Other Topics Concern  . Not on file   Social History Narrative     reports that he has never smoked. He does not have any smokeless tobacco history on file. He reports that he drinks alcohol. He reports that he does not use illicit drugs.  Immunization History  Administered Date(s) Administered  . Influenza Split 05/08/2011, 05/07/2012  . Influenza Whole 04/22/2008, 05/25/2009  . Influenza-Unspecified 05/02/2014, 05/10/2015  . Pneumococcal Polysaccharide-23 04/10/2010, 12/15/2013  . Td 04/10/2010    No Known Allergies  Medications: Patient's Medications  New Prescriptions   No medications on file  Previous Medications   HYDROCODONE-ACETAMINOPHEN (NORCO/VICODIN) 5-325 MG PER TABLET    Take 1 tablet by mouth every 8 (eight) hours as needed for moderate pain.   LOPERAMIDE (IMODIUM) 2 MG CAPSULE    Give 1 tablet po after each loose stool PRN. Do not exceed 8mg  per 24 hour.   MENTHOL, TOPICAL ANALGESIC, (BIOFREEZE) 4 % GEL    Apply topically. Every 8 hours as needed to aright knee and hip.   SIMVASTATIN (ZOCOR) 10 MG TABLET    Take 1 tablet (10 mg total) by mouth daily.  Modified Medications   No medications on file  Discontinued Medications   No medications on  file    Review of Systems  Unable to perform ROS: Dementia    Filed Vitals:   12/11/15 0911  BP: 110/66  Pulse: 67  Temp: 97.2 F (36.2 C)  TempSrc: Oral  Resp: 22  Weight: 132 lb 3.2 oz (59.966 kg)   Body mass index is 18.45 kg/(m^2).  Physical Exam  Constitutional: He appears well-developed.  Frail appearing in NAD, sitting in bed  HENT:  Mouth/Throat: Oropharynx is clear and moist.  No teeth   Eyes: Pupils are equal, round, and reactive to light. No scleral icterus.  Neck: Neck supple. Carotid bruit is not present.  Cardiovascular: Normal rate, regular rhythm, normal heart sounds and intact distal pulses.  Exam reveals no gallop and no friction  rub.   No murmur heard. no distal LE swelling. No calf TTP  Pulmonary/Chest: Effort normal and breath sounds normal. He has no wheezes. He has no rales. He exhibits no tenderness.  Abdominal: Soft. Bowel sounds are normal. He exhibits no distension, no abdominal bruit, no pulsatile midline mass and no mass. There is no tenderness. There is no rebound and no guarding.  Musculoskeletal: He exhibits edema (small and large joint deformities).  Unna boot intact  Lymphadenopathy:    He has no cervical adenopathy.  Neurological: He is alert.  Right hemiparesis  Skin: Skin is warm and dry. No rash noted.  Postinflammatory changes in leg b/l  Psychiatric: He has a normal mood and affect. His behavior is normal.     Labs reviewed: Nursing Home on 10/13/2015  Component Date Value Ref Range Status  . Triglycerides 09/11/2015 74  40 - 160 mg/dL Final  . Cholesterol 09/11/2015 140  0 - 200 mg/dL Final  . HDL 09/11/2015 31* 35 - 70 mg/dL Final  . LDL Cholesterol 09/11/2015 94   Final    No results found.   Assessment/Plan    ICD-9-CM ICD-10-CM   1. Osteoarthrosis, unspecified whether generalized or localized, involving lower leg 715.96 M17.9   2. Dementia without behavioral disturbance 294.20 F03.90   3. HYPERTENSION, BENIGN SYSTEMIC 401.1 I10    with hx hypotension  4. Protein-calorie malnutrition, severe ([place]) 262 E43   5. Late effects of CVA (cerebrovascular accident) 438.9 I69.90    with dysphagia  6. Hyperlipidemia 272.4 E78.5   7. Vitamin B12 deficiency 266.2 E53.8   8. Anemia of chronic disease 285.29 D63.8    Check CMP, lipid panel and CBC  Pt is medically stable on current tx plan. Continue current medications as ordered. PT/OT/ST as indicated. Will follow  Renne Platts S. Perlie Gold  [facility] [address] [place], [place] [address] [phone] Cell (Monday-Friday 8 AM - 5 PM) [phone] After 5 PM and follow  prompts

## 2015-12-13 LAB — BASIC METABOLIC PANEL
BUN: 17 mg/dL (ref 4–21)
CREATININE: 0.6 mg/dL (ref 0.6–1.3)
Glucose: 96 mg/dL
Potassium: 4.1 mmol/L (ref 3.4–5.3)
Sodium: 138 mmol/L (ref 137–147)

## 2015-12-13 LAB — LIPID PANEL
CHOLESTEROL: 125 mg/dL (ref 0–200)
HDL: 30 mg/dL — AB (ref 35–70)
LDL CALC: 83 mg/dL
TRIGLYCERIDES: 60 mg/dL (ref 40–160)

## 2015-12-13 LAB — CBC AND DIFFERENTIAL
HEMATOCRIT: 39 % — AB (ref 41–53)
Hemoglobin: 13.1 g/dL — AB (ref 13.5–17.5)
PLATELETS: 247 10*3/uL (ref 150–399)
WBC: 7.7 10*3/mL

## 2015-12-13 LAB — HEPATIC FUNCTION PANEL
ALK PHOS: 79 U/L (ref 25–125)
ALT: 10 U/L (ref 10–40)
AST: 15 U/L (ref 14–40)
Bilirubin, Total: 0.4 mg/dL

## 2016-01-18 ENCOUNTER — Non-Acute Institutional Stay: Payer: Medicare Other | Admitting: Internal Medicine

## 2016-01-18 ENCOUNTER — Encounter: Payer: Self-pay | Admitting: Internal Medicine

## 2016-01-18 DIAGNOSIS — E441 Mild protein-calorie malnutrition: Secondary | ICD-10-CM

## 2016-01-18 DIAGNOSIS — I10 Essential (primary) hypertension: Secondary | ICD-10-CM

## 2016-01-18 DIAGNOSIS — E785 Hyperlipidemia, unspecified: Secondary | ICD-10-CM

## 2016-01-18 DIAGNOSIS — F0390 Unspecified dementia without behavioral disturbance: Secondary | ICD-10-CM | POA: Diagnosis not present

## 2016-01-18 DIAGNOSIS — M1711 Unilateral primary osteoarthritis, right knee: Secondary | ICD-10-CM

## 2016-01-18 DIAGNOSIS — M170 Bilateral primary osteoarthritis of knee: Secondary | ICD-10-CM

## 2016-01-18 DIAGNOSIS — M1712 Unilateral primary osteoarthritis, left knee: Secondary | ICD-10-CM

## 2016-01-18 DIAGNOSIS — M179 Osteoarthritis of knee, unspecified: Secondary | ICD-10-CM | POA: Diagnosis not present

## 2016-01-18 DIAGNOSIS — I6990 Unspecified sequelae of unspecified cerebrovascular disease: Secondary | ICD-10-CM

## 2016-01-18 DIAGNOSIS — I6930 Unspecified sequelae of cerebral infarction: Secondary | ICD-10-CM

## 2016-01-18 NOTE — Progress Notes (Signed)
DATE: 01/18/16  Location:  [facility] Room Number: 129 B Place of Service: SNF (31)   Extended Emergency Contact Information Primary Emergency Contact: [place]enn[place],[name] Address: [address] [place], [place] [address] [place] Phone: [phone] Relation: Daughter  Advanced Directive information Does patient have an advance directive?: No, Would patient like information on creating an advanced directive?: No - patient declined information FULL CODE Chief Complaint  Patient presents with  . Medical Management of Chronic Issues    Routine Visit    HPI:  82 yo male long term resident seen today for f/u. He c/o pain in b/l legs but mostly knees. He is a poor historian due to dementia. Hx obtained from chart. No falls. No nursing issues. Appetite ok  Dyslipidemia - stable on zocor 10 mg daily. LDL 83; HDL 30  hx CVA - has right hemiparesis. Stable without meds  Hypertension - diet controlled  Osteoarthritis - pain stable on vicodin 5/325 mg every 8 hours as needed   Hx Anemia - stable. Hgb 13.0. Hx B12 deficiency  CKD - stage II with Cr 0.7  Dementia - weight down 1 lb to 131lb 9.6 oz from 132 lbs. He does not take any meds. Albumin 3.3     Past Medical History  Diagnosis Date  . Hypertension   . Cancer ([place])   . Rhabdomyolysis   . Dysphagia   . Symbolic dysfunction   . Fall   . Buford complex   . Dementia   . Chronic kidney disease     History reviewed. No pertinent past surgical history.  Patient Care Team: Gildardo Cranker, DO as PCP - General (Internal Medicine) [place] ([place])  Social History   Social History  . Marital Status: Single    Spouse Name: N/A  . Number of Children: N/A  . Years of Education: N/A   Occupational History  . Not on file.   Social History Main Topics  . Smoking status: Never Smoker   . Smokeless tobacco: Not on file  .  Alcohol Use: Yes     Comment: Pt binges on alcohol per pt and family  . Drug Use: No  . Sexual Activity: No   Other Topics Concern  . Not on file   Social History Narrative     reports that he has never smoked. He does not have any smokeless tobacco history on file. He reports that he drinks alcohol. He reports that he does not use illicit drugs.  History reviewed. No pertinent family history. Unable to obtain due to dementia No family status information on file.    Immunization History  Administered Date(s) Administered  . Influenza Split 05/08/2011, 05/07/2012  . Influenza Whole 04/22/2008, 05/25/2009  . Influenza-Unspecified 05/02/2014, 05/10/2015  . Pneumococcal Polysaccharide-23 04/10/2010, 12/15/2013  . Td 04/10/2010    No Known Allergies  Medications: Patient's Medications  New Prescriptions   No medications on file  Previous Medications   HYDROCODONE-ACETAMINOPHEN (NORCO/VICODIN) 5-325 MG PER TABLET    Take 1 tablet by mouth every 8 (eight) hours as needed for moderate pain.   LOPERAMIDE (IMODIUM) 2 MG CAPSULE    Give 1 tablet po after each loose stool PRN. Do not exceed 8mg  per 24 hour.   MENTHOL, TOPICAL ANALGESIC, (BIOFREEZE) 4 % GEL    Apply topically. Every 8 hours as needed to aright knee and  hip.   NON FORMULARY    Magic cup by mouth daily   SIMVASTATIN (ZOCOR) 10 MG TABLET    Take 1 tablet (10 mg total) by mouth daily.  Modified Medications   No medications on file  Discontinued Medications   No medications on file    Review of Systems  Unable to perform ROS: Dementia    Filed Vitals:   01/18/16 0946  BP: 117/63  Pulse: 60  Temp: 97.8 F (36.6 C)  TempSrc: Oral  Resp: 20  Height: 5\' 11"  (1.803 m)  Weight: 131 lb 9.6 oz (59.693 kg)   Body mass index is 18.36 kg/(m^2).  Physical Exam  Constitutional: He appears well-developed.  Frail appearing in NAD, sitting in bed  HENT:  Mouth/Throat: Oropharynx is clear and moist.  No teeth   Eyes:  Pupils are equal, round, and reactive to light. No scleral icterus.  Neck: Neck supple. Carotid bruit is not present.  Cardiovascular: Normal rate, regular rhythm, normal heart sounds and intact distal pulses.  Exam reveals no gallop and no friction rub.   No murmur heard. no distal LE swelling. No calf TTP  Pulmonary/Chest: Effort normal and breath sounds normal. He has no wheezes. He has no rales. He exhibits no tenderness.  Abdominal: Soft. Bowel sounds are normal. He exhibits no distension, no abdominal bruit, no pulsatile midline mass and no mass. There is no tenderness. There is no rebound and no guarding.  Musculoskeletal: He exhibits edema (small and large joint deformities).  Unna boot intact b/l  Lymphadenopathy:    He has no cervical adenopathy.  Neurological: He is alert.  Right hemiparesis  Skin: Skin is warm and dry. No rash noted.  Postinflammatory changes in leg b/l. Heel b/l swelling but no breakdown. Unna boots intact  Psychiatric: He has a normal mood and affect. His behavior is normal.     Labs reviewed: Nursing Home on 01/18/2016  Component Date Value Ref Range Status  . Hemoglobin 12/13/2015 13.1* 13.5 - 17.5 g/dL Final  . HCT 12/13/2015 39* 41 - 53 % Final  . Platelets 12/13/2015 247  150 - 399 K/L Final  . WBC 12/13/2015 7.7   Final  . Glucose 12/13/2015 96   Final  . BUN 12/13/2015 17  4 - 21 mg/dL Final  . Creatinine 12/13/2015 0.6  0.6 - 1.3 mg/dL Final  . Potassium 12/13/2015 4.1  3.4 - 5.3 mmol/L Final  . Sodium 12/13/2015 138  137 - 147 mmol/L Final  . Triglycerides 12/13/2015 60  40 - 160 mg/dL Final  . Cholesterol 12/13/2015 125  0 - 200 mg/dL Final  . HDL 12/13/2015 30* 35 - 70 mg/dL Final  . LDL Cholesterol 12/13/2015 83   Final  . Alkaline Phosphatase 12/13/2015 79  25 - 125 U/L Final  . ALT 12/13/2015 10  10 - 40 U/L Final  . AST 12/13/2015 15  14 - 40 U/L Final  . Bilirubin, Total 12/13/2015 0.4   Final    No results  found.   Assessment/Plan   ICD-9-CM ICD-10-CM   1. Protein-calorie malnutrition, mild (H[facility]) - improved 263.1 E44.1   2. Osteoarthritis of both lower legs 715.96 M17.9    b/l knee especially  3. Dementia without behavioral disturbance 294.20 F03.90   4. HYPERTENSION, BENIGN SYSTEMIC 401.1 I10   5. Late effects of CVA (cerebrovascular accident) 438.9 I69.90   6. Hyperlipidemia 272.4 E78.5    Cont current meds as ordered  PT/OT/ST as indicated  Aspiration  precautions  Nutritional supplements as indicated  Will follow  Charline Hoskinson S. Perlie Gold  [facility] and Adult Medicine [address] [place], [place] [address] [phone] Cell (Monday-Friday 8 AM - 5 PM) [phone] After 5 PM and follow prompts

## 2016-02-05 LAB — LIPID PANEL
Cholesterol: 127 mg/dL (ref 0–200)
HDL: 32 mg/dL — AB (ref 35–70)
LDL Cholesterol: 85 mg/dL
Triglycerides: 52 mg/dL (ref 40–160)

## 2016-02-26 ENCOUNTER — Non-Acute Institutional Stay: Payer: Medicare Other | Admitting: Internal Medicine

## 2016-02-26 ENCOUNTER — Encounter: Payer: Self-pay | Admitting: Internal Medicine

## 2016-02-26 DIAGNOSIS — R627 Adult failure to thrive: Secondary | ICD-10-CM | POA: Diagnosis not present

## 2016-02-26 DIAGNOSIS — F0390 Unspecified dementia without behavioral disturbance: Secondary | ICD-10-CM | POA: Diagnosis not present

## 2016-02-26 DIAGNOSIS — I6990 Unspecified sequelae of unspecified cerebrovascular disease: Secondary | ICD-10-CM | POA: Diagnosis not present

## 2016-02-26 DIAGNOSIS — E785 Hyperlipidemia, unspecified: Secondary | ICD-10-CM | POA: Diagnosis not present

## 2016-02-26 DIAGNOSIS — I10 Essential (primary) hypertension: Secondary | ICD-10-CM

## 2016-02-26 DIAGNOSIS — E441 Mild protein-calorie malnutrition: Secondary | ICD-10-CM

## 2016-02-26 NOTE — Progress Notes (Signed)
Patient ID: [name] [name], male   DOB: [birth year], 82 y.o.   MRN: [id number]    DATE: 02/26/16  Location:  Nursing Home Location: [facility]  Nursing Home Room Number: 129 B Place of Service: SNF (31)   Extended Emergency Contact Information Primary Emergency Contact: Sweney,[name] Address: [address] [place], [place] [address] [place] Phone: [phone] Relation: Daughter  Advanced Directive information Does patient have an advance directive?: No, Would patient like information on creating an advanced directive?: No - patient declined information FULL CODE Chief Complaint  Patient presents with  . Medical Management of Chronic Issues    Routine Visit    HPI:  82 yo male long term resident seen today for f/u. He has no concerns today.He is a poor historian due to dementia. Hx obtained from chart. No falls. No nursing issues. Appetite ok  Dyslipidemia - stable on zocor 10 mg daily. 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 Stable without meds  Hypertension - diet controlled  Osteoarthritis - pain stable on vicodin 5/325 mg every 8 hours as needed and biofreeze gel  Hx Anemia - stable. Hgb 13.0. Hx B12 deficiency  CKD - stage II with Cr 0.7  Dementia - progressively declining.  weight down 2 lbs to 129 lbs. He does not take any meds. Albumin 3.3. He gets nutritional supplements    Past Medical History:  Diagnosis Date  . Buford complex   . Cancer ([place])   . Chronic kidney disease   . Dementia   . Dysphagia   . Fall   . Hypertension   . Rhabdomyolysis   . Symbolic dysfunction     History reviewed. No pertinent surgical history.  Patient Care Team: Gildardo Cranker, DO as PCP - General (Internal Medicine) [place] ([place])  Social History   Social History  . Marital status: Single    Spouse name: N/A  . Number of children: N/A  . Years of education: N/A   Occupational  History  . Not on file.   Social History Main Topics  . Smoking status: Never Smoker  . Smokeless tobacco: Not on file  . Alcohol use Yes     Comment: Pt binges on alcohol per pt and family  . Drug use: No  . Sexual activity: No   Other Topics Concern  . Not on file   Social History Narrative  . No narrative on file     reports that he has never smoked. He does not have any smokeless tobacco history on file. He reports that he drinks alcohol. He reports that he does not use drugs.  History reviewed. No pertinent family history. No family status information on file.    Immunization History  Administered Date(s) Administered  . Influenza Split 05/08/2011, 05/07/2012  . Influenza Whole 04/22/2008, 05/25/2009  . Influenza-Unspecified 05/02/2014, 05/10/2015  . PPD Test 12/15/2013  . Pneumococcal Polysaccharide-23 04/10/2010, 12/15/2013  . Td 04/10/2010    No Known Allergies  Medications: Patient's Medications  New Prescriptions   No medications on file  Previous Medications   HYDROCODONE-ACETAMINOPHEN (NORCO/VICODIN) 5-325 MG PER TABLET    Take 1 tablet by mouth every 8 (eight) hours as needed for moderate pain.   LOPERAMIDE (IMODIUM) 2 MG CAPSULE    Give 1 tablet po after each loose stool PRN. Do not exceed 8mg  per 24 hour.   MENTHOL, TOPICAL  ANALGESIC, (BIOFREEZE) 4 % GEL    Apply topically. Every 8 hours as needed to aright knee and hip.   NON FORMULARY    Magic cup by mouth daily   SIMVASTATIN (ZOCOR) 10 MG TABLET    Take 1 tablet (10 mg total) by mouth daily.  Modified Medications   No medications on file  Discontinued Medications   No medications on file    Review of Systems  Unable to perform ROS: Dementia    Vitals:   02/26/16 1208  BP: (!) 118/52  Pulse: 78  Resp: (!) 22  Temp: 98 F (36.7 C)  TempSrc: Oral  Weight: 129 lb 9.6 oz (58.8 kg)  Height: 5\' 11"  (1.803 m)   Body mass index is 18.08 kg/m.  Physical Exam  Constitutional: He appears  well-developed.  Frail appearing in NAD, sitting in bed  HENT:  Mouth/Throat: Oropharynx is clear and moist.  No teeth   Eyes: Pupils are equal, round, and reactive to light. No scleral icterus.  Neck: Neck supple. Carotid bruit is not present.  Cardiovascular: Normal rate, regular rhythm and intact distal pulses.  Exam reveals no gallop and no friction rub.   Murmur (1/6 SEM) heard. no distal LE swelling. No calf TTP  Pulmonary/Chest: Effort normal and breath sounds normal. He has no wheezes. He has no rales. He exhibits no tenderness.  Abdominal: Soft. Bowel sounds are normal. He exhibits no distension, no abdominal bruit, no pulsatile midline mass and no mass. There is no tenderness. There is no rebound and no guarding.  Musculoskeletal: He exhibits edema (small and large joint deformities).  Unna boot intact b/l  Lymphadenopathy:    He has no cervical adenopathy.  Neurological: He is alert.  Right hemiparesis  Skin: Skin is warm and dry. No rash noted.  Postinflammatory changes in leg b/l. Heel b/l swelling but no breakdown. Unna boots intact  Psychiatric: He has a normal mood and affect. His behavior is normal.     Labs reviewed: Nursing Home on 02/26/2016  Component Date Value Ref Range Status  . Triglycerides 02/05/2016 52  40 - 160 mg/dL Final  . Cholesterol 02/05/2016 127  0 - 200 mg/dL Final  . HDL 02/05/2016 32* 35 - 70 mg/dL Final  . LDL Cholesterol 02/05/2016 85  mg/dL Final  Nursing Home on 01/18/2016  Component Date Value Ref Range Status  . Hemoglobin 12/13/2015 13.1* 13.5 - 17.5 g/dL Final  . HCT 12/13/2015 39* 41 - 53 % Final  . Platelets 12/13/2015 247  150 - 399 K/L Final  . WBC 12/13/2015 7.7  10^3/mL Final  . Glucose 12/13/2015 96  mg/dL Final  . BUN 12/13/2015 17  4 - 21 mg/dL Final  . Creatinine 12/13/2015 0.6  0.6 - 1.3 mg/dL Final  . Potassium 12/13/2015 4.1  3.4 - 5.3 mmol/L Final  . Sodium 12/13/2015 138  137 - 147 mmol/L Final  . Triglycerides  12/13/2015 60  40 - 160 mg/dL Final  . Cholesterol 12/13/2015 125  0 - 200 mg/dL Final  . HDL 12/13/2015 30* 35 - 70 mg/dL Final  . LDL Cholesterol 12/13/2015 83  mg/dL Final  . Alkaline Phosphatase 12/13/2015 79  25 - 125 U/L Final  . ALT 12/13/2015 10  10 - 40 U/L Final  . AST 12/13/2015 15  14 - 40 U/L Final  . Bilirubin, Total 12/13/2015 0.4  mg/dL Final    No results found.   Assessment/Plan   ICD-9-CM ICD-10-CM   1.  Protein-calorie malnutrition, mild (HCC) 263.1 E44.1   2. FTT (failure to thrive) in adult 783.7 R62.7   3. Dementia without behavioral disturbance 294.20 F03.90   4. HYPERTENSION, BENIGN SYSTEMIC 401.1 I10   5. Hyperlipidemia 272.4 E78.5   6. Late effects of CVA (cerebrovascular accident) 438.9 I69.90    Cont current meds as ordered  PT/OT/ST as indicated  Aspiration precautions  Nutritional supplements as ordered  Will follow   Bensyn Bornemann S. Perlie Gold  [facility] and Adult Medicine [address] [place], [place] [address] [phone] Cell (Monday-Friday 8 AM - 5 PM) [phone] After 5 PM and follow prompts

## 2016-03-22 ENCOUNTER — Non-Acute Institutional Stay: Payer: Medicare Other | Admitting: Nurse Practitioner

## 2016-03-22 ENCOUNTER — Encounter: Payer: Self-pay | Admitting: Nurse Practitioner

## 2016-03-22 DIAGNOSIS — I6990 Unspecified sequelae of unspecified cerebrovascular disease: Secondary | ICD-10-CM

## 2016-03-22 DIAGNOSIS — R627 Adult failure to thrive: Secondary | ICD-10-CM | POA: Diagnosis not present

## 2016-03-22 DIAGNOSIS — I6930 Unspecified sequelae of cerebral infarction: Secondary | ICD-10-CM

## 2016-03-22 DIAGNOSIS — E785 Hyperlipidemia, unspecified: Secondary | ICD-10-CM

## 2016-03-22 DIAGNOSIS — F0390 Unspecified dementia without behavioral disturbance: Secondary | ICD-10-CM | POA: Diagnosis not present

## 2016-03-22 DIAGNOSIS — M170 Bilateral primary osteoarthritis of knee: Secondary | ICD-10-CM

## 2016-03-22 DIAGNOSIS — M1712 Unilateral primary osteoarthritis, left knee: Secondary | ICD-10-CM

## 2016-03-22 DIAGNOSIS — M1711 Unilateral primary osteoarthritis, right knee: Secondary | ICD-10-CM

## 2016-03-22 DIAGNOSIS — M179 Osteoarthritis of knee, unspecified: Secondary | ICD-10-CM | POA: Diagnosis not present

## 2016-03-22 NOTE — Progress Notes (Signed)
Patient ID: [name] [name], male   DOB: Jul 20, 1932, 82 y.o.   MRN: [id number]    Nursing Home Location:  [facility]  Place of Service: SNF (31)  PCP: [name], MD  No Known Allergies  Chief Complaint  Patient presents with  . Medical Management of Chronic Issues    Routine Visit    HPI:  Patient is a 82 y.o. male seen today at [facility] for routine follow up on chronic conditions. Pt with a PMH is significant for CKD, HLD, HTN, prostate CA and dementia. There has been no acute changes in the last month. Nursing has no concerns.   Review of Systems:   Review of Systems  Constitutional: Negative for activity change, appetite change, fatigue and unexpected weight change.  Eyes: Negative.   Respiratory: Negative for cough and shortness of breath.   Cardiovascular: Negative for chest pain, palpitations and leg swelling.  Gastrointestinal: Negative for abdominal pain, constipation and diarrhea.  Genitourinary: Negative for dysuria.  Musculoskeletal: Positive for arthralgias. Negative for myalgias.  Skin: Negative for color change and wound.  Neurological: Negative for dizziness and weakness.  Psychiatric/Behavioral: Positive for confusion. Negative for agitation and behavioral problems.       STML    Past Medical History:  Diagnosis Date  . Buford complex   . Cancer ([place])   . Chronic kidney disease   . Dementia   . Dysphagia   . Fall   . Hypertension   . Rhabdomyolysis   . Symbolic dysfunction    History reviewed. No pertinent surgical history. Social History:   reports that he has never smoked. He has never used smokeless tobacco. He reports that he does not drink alcohol or use drugs.  History reviewed. No pertinent family history.  Medications: Patient's Medications  New Prescriptions   No medications on file  Previous Medications   HYDROCODONE-ACETAMINOPHEN (NORCO/VICODIN) 5-325 MG PER TABLET    Take 1 tablet by mouth every 8 (eight) hours as  needed for moderate pain.   MENTHOL, TOPICAL ANALGESIC, (BIOFREEZE) 4 % GEL    Apply topically. Every 8 hours as needed to aright knee and hip.   NON FORMULARY    Magic cup by mouth twice daily   SIMVASTATIN (ZOCOR) 10 MG TABLET    Take 1 tablet (10 mg total) by mouth daily.  Modified Medications   No medications on file  Discontinued Medications   LOPERAMIDE (IMODIUM) 2 MG CAPSULE    Give 1 tablet po after each loose stool PRN. Do not exceed 8mg  per 24 hour.     Physical Exam: Vitals:   03/22/16 1347  BP: 133/82  Pulse: (!) 50  Resp: 18  Temp: 98.2 F (36.8 C)  SpO2: 95%  Weight: 129 lb (58.5 kg)  Height: 5\' 11"  (1.803 m)    Physical Exam  Constitutional: No distress.  Thin AA male in NAD  HENT:  Head: Normocephalic and atraumatic.  Mouth/Throat: Oropharynx is clear and moist. No oropharyngeal exudate.  Eyes: Conjunctivae are normal. Pupils are equal, round, and reactive to light.  Neck: Normal range of motion. Neck supple.  Cardiovascular: Normal rate, regular rhythm and normal heart sounds.   Pulmonary/Chest: Effort normal and breath sounds normal.  Abdominal: Soft. Bowel sounds are normal.  Musculoskeletal: He exhibits no edema or tenderness.  Neurological: He is alert.  Right sided weakness due to CVA  Skin: Skin is warm and dry. He is not diaphoretic.  Psychiatric: He has a normal mood and  affect. He exhibits abnormal recent memory and abnormal remote memory.    Labs reviewed: Basic Metabolic Panel:  Recent Labs  06/10/15 12/13/15  NA 134* 138  K 4.3 4.1  BUN 14 17  CREATININE 0.7 0.6   Liver Function Tests:  Recent Labs  06/10/15 12/13/15  AST 19 15  ALT 12 10  ALKPHOS 79 79   No results for input(s): LIPASE, AMYLASE in the last 8760 hours. No results for input(s): AMMONIA in the last 8760 hours. CBC:  Recent Labs  12/13/15  WBC 7.7  HGB 13.1*  HCT 39*  PLT 247   TSH: No results for input(s): TSH in the last 8760 hours. A1C: No results  found for: HGBA1C Lipid Panel:  Recent Labs  09/11/15 12/13/15 02/05/16  CHOL 140 125 127  HDL 31* 30* 32*  LDLCALC 94 83 85  TRIG 74 60 52     Assessment/Plan 1. FTT (failure to thrive) in adult FTT due to progression of dementia. Weight has remained stable in the last month. Pt followed by RD, conts on nutritional supplements.   2. Dementia without behavioral disturbance There has been no acute changes in cognitive or functional status. Staff providing ongoing support.   3. Late effects of CVA (cerebrovascular accident) With right sided weakness. Remains stable at this time.   4. Osteoarthritis of both lower legs Stable, conts on biofreeze and norco PRN  5. Hyperlipidemia LDL at goal. conts on zocor 10 mg daily       Jessica [name]  [facility] [phone] 8 am - 5 pm) [phone] (after hours)

## 2016-04-12 ENCOUNTER — Encounter: Payer: Self-pay | Admitting: Nurse Practitioner

## 2016-04-12 ENCOUNTER — Non-Acute Institutional Stay: Payer: Medicare Other | Admitting: Nurse Practitioner

## 2016-04-12 DIAGNOSIS — R1310 Dysphagia, unspecified: Secondary | ICD-10-CM

## 2016-04-12 DIAGNOSIS — I6990 Unspecified sequelae of unspecified cerebrovascular disease: Secondary | ICD-10-CM

## 2016-04-12 DIAGNOSIS — E43 Unspecified severe protein-calorie malnutrition: Secondary | ICD-10-CM

## 2016-04-12 DIAGNOSIS — F0390 Unspecified dementia without behavioral disturbance: Secondary | ICD-10-CM | POA: Diagnosis not present

## 2016-04-12 DIAGNOSIS — E785 Hyperlipidemia, unspecified: Secondary | ICD-10-CM | POA: Diagnosis not present

## 2016-04-12 NOTE — Progress Notes (Signed)
Patient ID: [name] [name], male   DOB: 1931-10-17, 82 y.o.   MRN: [id number]    Nursing Home Location:  [facility]  Place of Service: SNF (31)  PCP: [name], MD  No Known Allergies  Chief Complaint  Patient presents with  . Medical Management of Chronic Issues    Routine Visit    HPI:  Patient is a 82 y.o. male seen today at [facility] for routine follow up on chronic conditions. Pt with a PMH is significant for CKD, HLD, HTN, prostate CA and dementia. 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 Pt reports pain to right side which was effected by CVA. Pt denies any changes in appetite, worsening of pain, shortness of breath, chest pains, constipation.  Nursing does not have concerns at this time and pt without complaints.  Review of Systems:   Review of Systems  Constitutional: Negative for activity change, appetite change, fatigue and unexpected weight change.  Eyes: Negative.   Respiratory: Negative for cough and shortness of breath.   Cardiovascular: Negative for chest pain, palpitations and leg swelling.  Gastrointestinal: Negative for abdominal pain, constipation and diarrhea.  Genitourinary: Negative for dysuria.  Musculoskeletal: Positive for arthralgias. Negative for myalgias.  Skin: Negative for color change and wound.  Neurological: Negative for dizziness and weakness.  Psychiatric/Behavioral: Positive for confusion. Negative for agitation and behavioral problems.       STML    Past Medical History:  Diagnosis Date  . Buford complex   . Cancer ([place])   . Chronic kidney disease   . Dementia   . Dysphagia   . Fall   . Hypertension   . Rhabdomyolysis   . Symbolic dysfunction    History reviewed. No pertinent surgical history. Social History:   reports that he has never smoked. He has never used smokeless tobacco. He reports that he does not drink alcohol or use drugs.  History reviewed. No pertinent family  history.  Medications: Patient's Medications  New Prescriptions   No medications on file  Previous Medications   HYDROCODONE-ACETAMINOPHEN (NORCO/VICODIN) 5-325 MG PER TABLET    Take 1 tablet by mouth every 8 (eight) hours as needed for moderate pain.   MENTHOL, TOPICAL ANALGESIC, (BIOFREEZE) 4 % GEL    Apply topically. Every 8 hours as needed to aright knee and hip.   NON FORMULARY    Magic cup by mouth twice daily   SIMVASTATIN (ZOCOR) 10 MG TABLET    Take 1 tablet (10 mg total) by mouth daily.  Modified Medications   No medications on file  Discontinued Medications   No medications on file     Physical Exam: Vitals:   04/12/16 1116  BP: 120/70  Pulse: 70  Resp: 19  Temp: 98 F (36.7 C)  Weight: 134 lb 6.4 oz (61 kg)  Height: 5\' 11"  (1.803 m)    Physical Exam  Constitutional: No distress.  Thin AA male in NAD  HENT:  Head: Normocephalic and atraumatic.  Mouth/Throat: Oropharynx is clear and moist. No oropharyngeal exudate.  Eyes: Conjunctivae are normal. Pupils are equal, round, and reactive to light.  Neck: Normal range of motion. Neck supple.  Cardiovascular: Normal rate, regular rhythm and normal heart sounds.   Pulmonary/Chest: Effort normal and breath sounds normal.  Abdominal: Soft. Bowel sounds are normal.  Musculoskeletal: He exhibits no edema or tenderness.  Right sided hemiparesis with very little movement to LE. UE weak with limited movement.  Muscle atrophy noted  Neurological: He is alert.  Right sided weakness due to CVA  Skin: Skin is warm and dry. He is not diaphoretic.  Psychiatric: He has a normal mood and affect. He exhibits abnormal recent memory and abnormal remote memory.    Labs reviewed: Basic Metabolic Panel:  Recent Labs  06/10/15 12/13/15  NA 134* 138  K 4.3 4.1  BUN 14 17  CREATININE 0.7 0.6   Liver Function Tests:  Recent Labs  06/10/15 12/13/15  AST 19 15  ALT 12 10  ALKPHOS 79 79   No results for input(s): LIPASE,  AMYLASE in the last 8760 hours. No results for input(s): AMMONIA in the last 8760 hours. CBC:  Recent Labs  12/13/15  WBC 7.7  HGB 13.1*  HCT 39*  PLT 247   TSH: No results for input(s): TSH in the last 8760 hours. A1C: No results found for: HGBA1C Lipid Panel:  Recent Labs  09/11/15 12/13/15 02/05/16  CHOL 140 125 127  HDL 31* 30* 32*  LDLCALC 94 83 85  TRIG 74 60 52     Assessment/Plan 1. Protein-calorie malnutrition, severe ([place]) Weight has been stable, pt reports good appetite. Increase in weight noted over the last month.  2. Late effects of CVA (cerebrovascular accident) Pt with right sided hemiparesis and occasional pain. Pt working with restorative routinely, encouraged pt and staff to get pt OOB  3. Hyperlipidemia LDL 82. Pt conts on zocor  4. Dementia without behavioral disturbance cognitive and functional status remains stable. Pt has not tolerated dementia related medication   5. Dysphagia No signs of aspiration noted, cont on precautions     Bailei Buist [name]  [facility] [phone] 8 am - 5 pm) [phone] (after hours)

## 2016-05-10 ENCOUNTER — Encounter: Payer: Self-pay | Admitting: Nurse Practitioner

## 2016-05-10 ENCOUNTER — Non-Acute Institutional Stay (SKILLED_NURSING_FACILITY): Payer: Medicare Other | Admitting: Nurse Practitioner

## 2016-05-10 DIAGNOSIS — F0390 Unspecified dementia without behavioral disturbance: Secondary | ICD-10-CM | POA: Diagnosis not present

## 2016-05-10 DIAGNOSIS — R1310 Dysphagia, unspecified: Secondary | ICD-10-CM

## 2016-05-10 DIAGNOSIS — E785 Hyperlipidemia, unspecified: Secondary | ICD-10-CM | POA: Diagnosis not present

## 2016-05-10 DIAGNOSIS — E441 Mild protein-calorie malnutrition: Secondary | ICD-10-CM | POA: Diagnosis not present

## 2016-05-10 NOTE — Progress Notes (Signed)
Patient ID: [name] [name], male   DOB: 1932-02-01, 82 y.o.   MRN: [id number]    Nursing Home Location:  [facility]  Place of Service: SNF (31)  PCP: [name], MD  No Known Allergies  Chief Complaint  Patient presents with  . Medical Management of Chronic Issues    Routine Visit    HPI:  Patient is a 82 y.o. male seen today at [facility] for routine follow up on chronic conditions. Pt with a PMH is significant for CKD, HLD, HTN, OA, prostate CA and dementia. Pt has had no acute issues in the last month. Pt is bed bound. Pt alert and oriented at time of visit.  Pt has no complaints today. No changes in appetite or intake. No constipation or diarrhea.  No pain noted. No changes or problems with urination.  Review of Systems:   Review of Systems  Constitutional: Negative for activity change, appetite change, fatigue and unexpected weight change.  Eyes: Negative.   Respiratory: Negative for cough and shortness of breath.   Cardiovascular: Negative for chest pain, palpitations and leg swelling.  Gastrointestinal: Negative for abdominal pain, constipation and diarrhea.  Genitourinary: Negative for dysuria.  Musculoskeletal: Positive for arthralgias. Negative for myalgias.  Skin: Negative for color change and wound.  Neurological: Negative for dizziness and weakness.  Psychiatric/Behavioral: Positive for confusion. Negative for agitation and behavioral problems.       STML    Past Medical History:  Diagnosis Date  . Buford complex   . Cancer ([place])   . Chronic kidney disease   . Dementia   . Dysphagia   . Fall   . Hypertension   . Rhabdomyolysis   . Symbolic dysfunction    History reviewed. No pertinent surgical history. Social History:   reports that he has never smoked. He has never used smokeless tobacco. He reports that he does not drink alcohol or use drugs.  History reviewed. No pertinent family history.  Medications: Patient's Medications  New  Prescriptions   No medications on file  Previous Medications   HYDROCODONE-ACETAMINOPHEN (NORCO/VICODIN) 5-325 MG PER TABLET    Take 1 tablet by mouth every 8 (eight) hours as needed for moderate pain.   MENTHOL, TOPICAL ANALGESIC, (BIOFREEZE) 4 % GEL    Apply topically. Every 8 hours as needed to aright knee and hip.   NON FORMULARY    Magic cup by mouth twice daily   SIMVASTATIN (ZOCOR) 10 MG TABLET    Take 1 tablet (10 mg total) by mouth daily.  Modified Medications   No medications on file  Discontinued Medications   No medications on file     Physical Exam: Vitals:   05/10/16 0933  BP: 120/60  Pulse: 78  Resp: 20  Temp: 97.6 F (36.4 C)  Weight: 132 lb 6.4 oz (60.1 kg)  Height: 5\' 11"  (1.803 m)    Physical Exam  Constitutional: No distress.  Thin, frail  AA male in NAD  HENT:  Head: Normocephalic and atraumatic.  Mouth/Throat: Oropharynx is clear and moist. No oropharyngeal exudate.  Eyes: Conjunctivae are normal. Pupils are equal, round, and reactive to light.  Neck: Normal range of motion. Neck supple.  Cardiovascular: Normal rate, regular rhythm and normal heart sounds.   Pulmonary/Chest: Effort normal and breath sounds normal.  Abdominal: Soft. Bowel sounds are normal.  Musculoskeletal: He exhibits no edema or tenderness.  Right sided hemiparesis with very little movement to Lower Extremity . Upper weak with limited movement.  Muscle atrophy noted  Neurological: He is alert.  Right sided weakness due to CVA  Skin: Skin is warm and dry. He is not diaphoretic.  Psychiatric: He has a normal mood and affect. He exhibits abnormal recent memory and abnormal remote memory.    Labs reviewed: Basic Metabolic Panel:  Recent Labs  06/10/15 12/13/15  NA 134* 138  K 4.3 4.1  BUN 14 17  CREATININE 0.7 0.6   Liver Function Tests:  Recent Labs  06/10/15 12/13/15  AST 19 15  ALT 12 10  ALKPHOS 79 79   No results for input(s): LIPASE, AMYLASE in the last 8760  hours. No results for input(s): AMMONIA in the last 8760 hours. CBC:  Recent Labs  12/13/15  WBC 7.7  HGB 13.1*  HCT 39*  PLT 247   TSH: No results for input(s): TSH in the last 8760 hours. A1C: No results found for: HGBA1C Lipid Panel:  Recent Labs  09/11/15 12/13/15 02/05/16  CHOL 140 125 127  HDL 31* 30* 32*  LDLCALC 94 83 85  TRIG 74 60 52     Assessment/Plan 1. Dysphagia, unspecified type No signs of aspiration, cont on aspiration precautions.   2. Dementia without behavioral disturbance, unspecified dementia type Dementia has been stable, no behaviors. Oriented x 4 today. Has not tolerated medication for dementia in the past.   3. Protein-calorie malnutrition, mild ([place]) -weight has been stable,conts on nutritional supplements   4. Hyperlipidemia, unspecified hyperlipidemia type LDL at 85 on simvastatin, LFT stable in May    Ivan Lacher [name]  [facility] [phone] 8 am - 5 pm) [phone] (after hours)

## 2016-05-31 ENCOUNTER — Non-Acute Institutional Stay: Payer: Medicare Other | Admitting: Nurse Practitioner

## 2016-05-31 ENCOUNTER — Encounter: Payer: Self-pay | Admitting: Nurse Practitioner

## 2016-05-31 DIAGNOSIS — F0390 Unspecified dementia without behavioral disturbance: Secondary | ICD-10-CM | POA: Diagnosis not present

## 2016-05-31 DIAGNOSIS — M1710 Unilateral primary osteoarthritis, unspecified knee: Secondary | ICD-10-CM

## 2016-05-31 DIAGNOSIS — I6990 Unspecified sequelae of unspecified cerebrovascular disease: Secondary | ICD-10-CM | POA: Diagnosis not present

## 2016-05-31 DIAGNOSIS — C61 Malignant neoplasm of prostate: Secondary | ICD-10-CM

## 2016-05-31 DIAGNOSIS — I6930 Unspecified sequelae of cerebral infarction: Secondary | ICD-10-CM

## 2016-05-31 DIAGNOSIS — IMO0002 Reserved for concepts with insufficient information to code with codable children: Secondary | ICD-10-CM

## 2016-05-31 DIAGNOSIS — E538 Deficiency of other specified B group vitamins: Secondary | ICD-10-CM | POA: Diagnosis not present

## 2016-05-31 NOTE — Progress Notes (Signed)
Patient ID: [name] [name], male   DOB: April 15, 1932, 82 y.o.   MRN: [id number]    Nursing Home Location:  [facility]  Place of Service: SNF (31)  PCP: [name], MD  No Known Allergies  Chief Complaint  Patient presents with  . Medical Management of Chronic Issues    Routine Visit    HPI:  Patient is a 82 y.o. male seen today at [facility] for routine follow up on chronic conditions. Pt with a PMH is significant for CKD, HLD, HTN, OA, prostate CA and dementia. There has been no changes in the last month. Pt conts to stay in bed, reports lift has been broken and that is why he is unable to get out of bed. Also reports his wheelchair got taken away. Will have staff looking issue as to why he is not getting out of bed.  Pt denies increase in pain.  No cough or congestion.   Nursing without acute issue at this time.   Review of Systems:   Review of Systems  Constitutional: Negative for activity change, appetite change, fatigue and unexpected weight change.  Eyes: Negative.   Respiratory: Negative for cough and shortness of breath.   Cardiovascular: Negative for chest pain, palpitations and leg swelling.  Gastrointestinal: Negative for abdominal pain, constipation and diarrhea.  Genitourinary: Negative for dysuria and frequency.  Musculoskeletal: Positive for arthralgias. Negative for myalgias.  Skin: Negative for color change and wound.  Neurological: Negative for dizziness and weakness.  Psychiatric/Behavioral: Negative for agitation and behavioral problems.       STML    Past Medical History:  Diagnosis Date  . Buford complex   . Cancer ([place])   . Chronic kidney disease   . Dementia   . Dysphagia   . Fall   . Hypertension   . Rhabdomyolysis   . Symbolic dysfunction    History reviewed. No pertinent surgical history. Social History:   reports that he has never smoked. He has never used smokeless tobacco. He reports that he does not drink alcohol or use  drugs.  History reviewed. No pertinent family history.  Medications: Patient's Medications  New Prescriptions   No medications on file  Previous Medications   MENTHOL, TOPICAL ANALGESIC, (BIOFREEZE) 4 % GEL    Apply topically. Every 8 hours as needed to aright knee and hip.   NON FORMULARY    Magic cup by mouth twice daily   SIMVASTATIN (ZOCOR) 10 MG TABLET    Take 1 tablet (10 mg total) by mouth daily.  Modified Medications   No medications on file  Discontinued Medications   HYDROCODONE-ACETAMINOPHEN (NORCO/VICODIN) 5-325 MG PER TABLET    Take 1 tablet by mouth every 8 (eight) hours as needed for moderate pain.     Physical Exam: Vitals:   05/31/16 1110  BP: 137/85  Pulse: 78  Resp: 20  Temp: 97.6 F (36.4 C)  Weight: 130 lb (59 kg)  Height: 5\' 11"  (1.803 m)    Physical Exam  Constitutional: No distress.  Thin, frail  AA male in NAD  HENT:  Head: Normocephalic and atraumatic.  Mouth/Throat: Oropharynx is clear and moist. No oropharyngeal exudate.  Eyes: Conjunctivae are normal. Pupils are equal, round, and reactive to light.  Neck: Normal range of motion. Neck supple.  Cardiovascular: Normal rate, regular rhythm and normal heart sounds.   Pulmonary/Chest: Effort normal and breath sounds normal.  Abdominal: Soft. Bowel sounds are normal.  Musculoskeletal: He exhibits no edema or  tenderness.  Right sided hemiparesis with very little movement to Lower Extremity . Upper weak with limited movement.  Muscle atrophy noted  Neurological: He is alert.  Right sided weakness due to CVA  Skin: Skin is warm and dry. He is not diaphoretic.  Psychiatric: He has a normal mood and affect. He exhibits abnormal recent memory and abnormal remote memory.    Labs reviewed: Basic Metabolic Panel:  Recent Labs  06/10/15 12/13/15  NA 134* 138  K 4.3 4.1  BUN 14 17  CREATININE 0.7 0.6   Liver Function Tests:  Recent Labs  06/10/15 12/13/15  AST 19 15  ALT 12 10  ALKPHOS 79  79   No results for input(s): LIPASE, AMYLASE in the last 8760 hours. No results for input(s): AMMONIA in the last 8760 hours. CBC:  Recent Labs  12/13/15  WBC 7.7  HGB 13.1*  HCT 39*  PLT 247   TSH: No results for input(s): TSH in the last 8760 hours. A1C: No results found for: HGBA1C Lipid Panel:  Recent Labs  09/11/15 12/13/15 02/05/16  CHOL 140 125 127  HDL 31* 30* 32*  LDLCALC 94 83 85  TRIG 74 60 52     Assessment/Plan 1. Osteoarthrosis involving lower leg -norco has been d/c'd due to nonuse, to use biofreeze as needed  2. Dementia without behavioral disturbance, unspecified dementia type Memory stable without decline. Oriented x 4.   3. PROSTATE CANCER Will attempt to get records from urologist, appears there has been no recent follow up  4. Vitamin B12 deficiency Will follow up cbc  5. Late effects of CVA (cerebrovascular accident) Stable, without increase in symptoms or pain.   6. Hyperlipidemia Stable, conts on simvastatin, follow up [place]. Harle Battiest  [facility] [phone] 8 am - 5 pm) [phone] (after hours)

## 2016-06-04 LAB — CBC AND DIFFERENTIAL
HEMATOCRIT: 39 % — AB (ref 41–53)
HEMOGLOBIN: 13.1 g/dL — AB (ref 13.5–17.5)
PLATELETS: 228 10*3/uL (ref 150–399)
WBC: 6.7 10*3/mL

## 2016-06-04 LAB — BASIC METABOLIC PANEL
BUN: 9 mg/dL (ref 4–21)
CREATININE: 0.7 mg/dL (ref 0.6–1.3)
Glucose: 111 mg/dL
Potassium: 4.7 mmol/L (ref 3.4–5.3)
Sodium: 135 mmol/L — AB (ref 137–147)

## 2016-06-04 LAB — HEPATIC FUNCTION PANEL
ALK PHOS: 73 U/L (ref 25–125)
ALT: 14 U/L (ref 10–40)
AST: 16 U/L (ref 14–40)
Bilirubin, Total: 0.4 mg/dL

## 2016-06-07 ENCOUNTER — Non-Acute Institutional Stay: Payer: Medicare Other | Admitting: Nurse Practitioner

## 2016-06-07 ENCOUNTER — Encounter: Payer: Self-pay | Admitting: Nurse Practitioner

## 2016-06-07 DIAGNOSIS — C61 Malignant neoplasm of prostate: Secondary | ICD-10-CM | POA: Diagnosis not present

## 2016-06-07 DIAGNOSIS — R410 Disorientation, unspecified: Secondary | ICD-10-CM | POA: Diagnosis not present

## 2016-06-07 LAB — CBC AND DIFFERENTIAL
HEMATOCRIT: 41 % (ref 41–53)
HEMOGLOBIN: 13.8 g/dL (ref 13.5–17.5)
Neutrophils Absolute: 5248 /uL
Platelets: 272 10*3/uL (ref 150–399)
WBC: 8.2 10^3/mL

## 2016-06-07 LAB — BASIC METABOLIC PANEL
BUN: 13 mg/dL (ref 4–21)
CREATININE: 0.7 mg/dL (ref 0.6–1.3)
Glucose: 101 mg/dL
POTASSIUM: 4.3 mmol/L (ref 3.4–5.3)
Sodium: 134 mmol/L — AB (ref 137–147)

## 2016-06-07 NOTE — Progress Notes (Signed)
Patient ID: [name] [name], male   DOB: October 18, 1931, 82 y.o.   MRN: [id number]    Nursing Home Location:  [facility]  Place of Service: SNF (31)  PCP: [name], MD  No Known Allergies  Chief Complaint  Patient presents with  . Acute Visit    Changes in behavior; possible UTI    HPI:  Patient is a 82 y.o. male seen today at [facility] for mental confusion. Pt with a PMH is significant for CKD, HLD, HTN, OA, prostate CA and dementia. Staff reports pt has been more confused with increase in behaviors. Called 911 on his roommate, blaming people for stealing his personal items.  Staff attempted to get urine with resistant via foley catheter, blood noted.  Called daughter regarding change in condition. Pt with hx of prostate cancer. Daughter reports her father was very noncompliant with medical care prior to being in facility. Never had screening colonoscopy because he would not take the prep. Reports he had been following by urologist but they released him. Records have been requested for this.  Pt denies pain with urination. Abdominal pain, fever or chills. No shortness of breath or chest pains,  Reports occasional cough.  Review of Systems:   Review of Systems  Constitutional: Negative for activity change, appetite change, fatigue and unexpected weight change.  Eyes: Negative.   Respiratory: Negative for cough and shortness of breath.   Cardiovascular: Negative for chest pain, palpitations and leg swelling.  Gastrointestinal: Negative for abdominal pain, constipation and diarrhea.  Genitourinary: Negative for dysuria and frequency.  Musculoskeletal: Positive for arthralgias. Negative for myalgias.  Skin: Negative for color change and wound.  Neurological: Negative for dizziness and weakness.  Psychiatric/Behavioral: Positive for agitation, behavioral problems and confusion.       STML    Past Medical History:  Diagnosis Date  . Buford complex   . Cancer ([place])     . Chronic kidney disease   . Dementia   . Dysphagia   . Fall   . Hypertension   . Rhabdomyolysis   . Symbolic dysfunction    History reviewed. No pertinent surgical history. Social History:   reports that he has never smoked. He has never used smokeless tobacco. He reports that he does not drink alcohol or use drugs.  History reviewed. No pertinent family history.  Medications: Patient's Medications  New Prescriptions   No medications on file  Previous Medications   MENTHOL, TOPICAL ANALGESIC, (BIOFREEZE) 4 % GEL    Apply topically. Every 8 hours as needed to aright knee and hip.   NON FORMULARY    Magic cup by mouth twice daily   SIMVASTATIN (ZOCOR) 10 MG TABLET    Take 1 tablet (10 mg total) by mouth daily.  Modified Medications   No medications on file  Discontinued Medications   No medications on file     Physical Exam: Vitals:   06/07/16 0853  BP: 132/78  Pulse: 72  Resp: 19  Temp: 98.4 F (36.9 C)  Weight: 130 lb (59 kg)  Height: 5\' 11"  (1.803 m)    Physical Exam  Constitutional: No distress.  Thin, frail  AA male in NAD  HENT:  Head: Normocephalic and atraumatic.  Mouth/Throat: Oropharynx is clear and moist. No oropharyngeal exudate.  Eyes: Conjunctivae are normal. Pupils are equal, round, and reactive to light.  Neck: Normal range of motion. Neck supple.  Cardiovascular: Normal rate, regular rhythm and normal heart sounds.   Pulmonary/Chest: Effort normal  and breath sounds normal. No respiratory distress. He has no wheezes. He has no rales.  Abdominal: Soft. Bowel sounds are normal. There is tenderness (suprapubic tenderness).  Musculoskeletal: He exhibits no edema or tenderness.  Right sided hemiparesis with very little movement to Lower Extremity . Upper weak with limited movement.  Muscle atrophy noted  Neurological: He is alert.  Right sided weakness due to CVA  Skin: Skin is warm and dry. He is not diaphoretic.  Psychiatric: He has a normal  mood and affect. He is agitated. He exhibits abnormal recent memory and abnormal remote memory.    Labs reviewed: Basic Metabolic Panel:  Recent Labs  06/10/15 12/13/15  NA 134* 138  K 4.3 4.1  BUN 14 17  CREATININE 0.7 0.6   Liver Function Tests:  Recent Labs  06/10/15 12/13/15  AST 19 15  ALT 12 10  ALKPHOS 79 79   No results for input(s): LIPASE, AMYLASE in the last 8760 hours. No results for input(s): AMMONIA in the last 8760 hours. CBC:  Recent Labs  12/13/15  WBC 7.7  HGB 13.1*  HCT 39*  PLT 247   TSH: No results for input(s): TSH in the last 8760 hours. A1C: No results found for: HGBA1C Lipid Panel:  Recent Labs  09/11/15 12/13/15 02/05/16  CHOL 140 125 127  HDL 31* 30* 32*  LDLCALC 94 83 85  TRIG 74 60 52     Assessment/Plan 1. Acute delirium UA abnormal, will treat with macrobid 100 mg BID for 7 days while awaiting culture CBC with diff and BMP  Increase fluid intake.  VS q shift  2. PROSTATE CANCER Blood in urine noted, will await C&S, records requested from urologist.   Carlos American. Harle Battiest  [facility] & [facility] [phone] 8 am - 5 pm) [phone] (after hours)

## 2016-07-03 ENCOUNTER — Encounter: Payer: Self-pay | Admitting: Nurse Practitioner

## 2016-07-03 ENCOUNTER — Non-Acute Institutional Stay: Payer: Medicare Other | Admitting: Nurse Practitioner

## 2016-07-03 DIAGNOSIS — R1312 Dysphagia, oropharyngeal phase: Secondary | ICD-10-CM

## 2016-07-03 DIAGNOSIS — I6990 Unspecified sequelae of unspecified cerebrovascular disease: Secondary | ICD-10-CM | POA: Diagnosis not present

## 2016-07-03 DIAGNOSIS — E441 Mild protein-calorie malnutrition: Secondary | ICD-10-CM

## 2016-07-03 DIAGNOSIS — IMO0002 Reserved for concepts with insufficient information to code with codable children: Secondary | ICD-10-CM

## 2016-07-03 DIAGNOSIS — M1710 Unilateral primary osteoarthritis, unspecified knee: Secondary | ICD-10-CM | POA: Diagnosis not present

## 2016-07-03 NOTE — Progress Notes (Signed)
Patient ID: [name] [name], male   DOB: [birth year], 82 y.o.   MRN: [id number]    Nursing Home Location:  [facility]  Place of Service: SNF (31)  PCP: [name], MD  No Known Allergies  Chief Complaint  Patient presents with  . Medical Management of Chronic Issues    Routine Visit    HPI:  Patient is a 82 y.o. male seen today at [name] for for routine follow up. Pt with a PMH is significant for CKD, HLD, HTN, OA, prostate CA and dementia. Pt has moved rooms since last visit due to not getting along with his roommate. Pt with paranoia at times.  Last month pt had increase in behaviors and confusion and was found to have UTI.  Treated with keflex and has completed course.   Review of Systems:   Review of Systems  Constitutional: Negative for activity change, appetite change, fatigue and unexpected weight change.  Eyes: Negative.   Respiratory: Negative for cough and shortness of breath.   Cardiovascular: Negative for chest pain, palpitations and leg swelling.  Gastrointestinal: Negative for abdominal pain, constipation and diarrhea.  Genitourinary: Negative for dysuria and frequency.  Musculoskeletal: Positive for arthralgias. Negative for myalgias.  Skin: Negative for color change and wound.  Neurological: Negative for dizziness and weakness.  Psychiatric/Behavioral: Positive for agitation, behavioral problems and confusion.       STML    Past Medical History:  Diagnosis Date  . Buford complex   . Cancer ([place])   . Chronic kidney disease   . Dementia   . Dysphagia   . Fall   . Hypertension   . Rhabdomyolysis   . Symbolic dysfunction    History reviewed. No pertinent surgical history. Social History:   reports that he has never smoked. He has never used smokeless tobacco. He reports that he does not drink alcohol or use drugs.  History reviewed. No pertinent family history.  Medications: Patient's Medications  New Prescriptions   No  medications on file  Previous Medications   ACETAMINOPHEN (TYLENOL) 325 MG TABLET    Take 650 mg by mouth every 4 (four) hours as needed for mild pain, moderate pain or fever. Not to exceed 3000 mg in 24 hour period.   MENTHOL, TOPICAL ANALGESIC, (BIOFREEZE) 4 % GEL    Apply topically. Every 8 hours as needed to aright knee and hip.   SIMVASTATIN (ZOCOR) 10 MG TABLET    Take 1 tablet (10 mg total) by mouth daily.  Modified Medications   No medications on file  Discontinued Medications   NON FORMULARY    Magic cup by mouth twice daily     Physical Exam: Vitals:   07/03/16 1259  BP: 119/72  Pulse: 66  Resp: 18  Temp: 97.4 F (36.3 C)  Weight: 131 lb (59.4 kg)  Height: 5\' 11"  (1.803 m)    Physical Exam  Constitutional: No distress.  Thin, frail  AA male in NAD  HENT:  Head: Normocephalic and atraumatic.  Mouth/Throat: Oropharynx is clear and moist. No oropharyngeal exudate.  Eyes: Conjunctivae are normal. Pupils are equal, round, and reactive to light.  Neck: Normal range of motion. Neck supple.  Cardiovascular: Normal rate, regular rhythm and normal heart sounds.   Pulmonary/Chest: Effort normal and breath sounds normal. No respiratory distress. He has no wheezes. He has no rales.  Abdominal: Soft. Bowel sounds are normal. He exhibits no distension. There is no tenderness.  Musculoskeletal: He exhibits no edema or  tenderness.  Right sided hemiparesis with very little movement to Lower Extremity . Upper weak with limited movement.  Muscle atrophy noted  Neurological: He is alert.  Right sided weakness due to CVA  Skin: Skin is warm and dry. He is not diaphoretic.  Psychiatric: He has a normal mood and affect. He exhibits normal recent memory and normal remote memory.    Labs reviewed: Basic Metabolic Panel:  Recent Labs  12/13/15 06/07/16  NA 138 134*  K 4.1 4.3  BUN 17 13  CREATININE 0.6 0.7   Liver Function Tests:  Recent Labs  12/13/15  AST 15  ALT 10    ALKPHOS 79   No results for input(s): LIPASE, AMYLASE in the last 8760 hours. No results for input(s): AMMONIA in the last 8760 hours. CBC:  Recent Labs  12/13/15 06/07/16  WBC 7.7 8.2  NEUTROABS  --  5,248  HGB 13.1* 13.8  HCT 39* 41  PLT 247 272   TSH: No results for input(s): TSH in the last 8760 hours. A1C: No results found for: HGBA1C Lipid Panel:  Recent Labs  09/11/15 12/13/15 02/05/16  CHOL 140 125 127  HDL 31* 30* 32*  LDLCALC 94 83 85  TRIG 74 60 52    Assessment/Plan 1. Osteoarthrosis involving lower leg Ongoing complaint however reports tylenol and biofreeze helps pain. Will cont current regimen.   2. Late effects of CVA (cerebrovascular accident) Right sided hemiparesis stable, without increase in pain.   3. Protein-calorie malnutrition, mild (HCC) Weight remains stable, declining supplements.  4. Oropharyngeal dysphagia Followed by ST, no signs of aspiration. Cont mech soft diet  [name] [name]  [facility] & Adult Medicine [phone] 8 am - 5 pm) [phone] (after hours)

## 2016-08-07 DIAGNOSIS — E785 Hyperlipidemia, unspecified: Secondary | ICD-10-CM | POA: Diagnosis not present

## 2016-08-07 DIAGNOSIS — M6281 Muscle weakness (generalized): Secondary | ICD-10-CM | POA: Diagnosis not present

## 2016-08-07 DIAGNOSIS — E538 Deficiency of other specified B group vitamins: Secondary | ICD-10-CM | POA: Diagnosis not present

## 2016-08-07 DIAGNOSIS — E43 Unspecified severe protein-calorie malnutrition: Secondary | ICD-10-CM | POA: Diagnosis not present

## 2016-08-07 DIAGNOSIS — K769 Liver disease, unspecified: Secondary | ICD-10-CM | POA: Diagnosis not present

## 2016-08-07 LAB — LIPID PANEL
Cholesterol: 106 mg/dL (ref 0–200)
HDL: 27 mg/dL — AB (ref 35–70)
LDL CALC: 62 mg/dL
Triglycerides: 86 mg/dL (ref 40–160)

## 2016-08-07 LAB — HEPATIC FUNCTION PANEL
ALT: 12 U/L (ref 10–40)
AST: 16 U/L (ref 14–40)
Alkaline Phosphatase: 69 U/L (ref 25–125)
Bilirubin, Total: 0.5 mg/dL

## 2016-08-28 DIAGNOSIS — H25813 Combined forms of age-related cataract, bilateral: Secondary | ICD-10-CM | POA: Diagnosis not present

## 2016-08-30 ENCOUNTER — Non-Acute Institutional Stay: Payer: Medicare Other | Admitting: Nurse Practitioner

## 2016-08-30 DIAGNOSIS — F0390 Unspecified dementia without behavioral disturbance: Secondary | ICD-10-CM | POA: Diagnosis not present

## 2016-08-30 DIAGNOSIS — B351 Tinea unguium: Secondary | ICD-10-CM | POA: Diagnosis not present

## 2016-08-30 DIAGNOSIS — E441 Mild protein-calorie malnutrition: Secondary | ICD-10-CM | POA: Diagnosis not present

## 2016-08-30 DIAGNOSIS — I6990 Unspecified sequelae of unspecified cerebrovascular disease: Secondary | ICD-10-CM

## 2016-08-30 DIAGNOSIS — M79672 Pain in left foot: Secondary | ICD-10-CM | POA: Diagnosis not present

## 2016-08-30 DIAGNOSIS — E785 Hyperlipidemia, unspecified: Secondary | ICD-10-CM

## 2016-08-30 DIAGNOSIS — I739 Peripheral vascular disease, unspecified: Secondary | ICD-10-CM | POA: Diagnosis not present

## 2016-08-30 DIAGNOSIS — M1710 Unilateral primary osteoarthritis, unspecified knee: Secondary | ICD-10-CM

## 2016-08-30 DIAGNOSIS — M79671 Pain in right foot: Secondary | ICD-10-CM | POA: Diagnosis not present

## 2016-08-30 DIAGNOSIS — IMO0002 Reserved for concepts with insufficient information to code with codable children: Secondary | ICD-10-CM

## 2016-08-30 NOTE — Progress Notes (Signed)
Patient ID: [name] [name], male   DOB: 1931-12-12, 83 y.o.   MRN: [id number]    Nursing Home Location:  [facility] Room: 109 A  Place of Service: SNF (31)  PCP: [name], MD  No Known Allergies  Chief Complaint  Patient presents with  . Medical Management of Chronic Issues    Routine Visit    HPI:  Patient is a 83 y.o. male seen today at [facility] for for routine follow up. Pt with a PMH is significant for CKD, HLD, HTN, OA, prostate CA and dementia. Pt has seen the podiatrist today. Reports he is feeling weak in his legs. Stays in the bed most of the time by choice. Reports pain in his knees. Gets biofreeze daily which helps. Pt denies any changes with appetite or diet, reports the food is not good here. No changes in mood or behavior. Pt is A & O x 4.   Review of Systems:   Review of Systems  Constitutional: Negative for activity change, appetite change, fatigue and unexpected weight change.  Eyes: Negative.   Respiratory: Negative for cough and shortness of breath.   Cardiovascular: Negative for chest pain, palpitations and leg swelling.  Gastrointestinal: Negative for abdominal pain, constipation and diarrhea.  Genitourinary: Negative for dysuria and frequency.  Musculoskeletal: Positive for arthralgias. Negative for myalgias.  Skin: Negative for color change and wound.  Neurological: Negative for dizziness and weakness.  Psychiatric/Behavioral: Positive for agitation, behavioral problems and confusion.       STML    Past Medical History:  Diagnosis Date  . Buford complex   . Cancer ([place])   . Chronic kidney disease   . Dementia   . Dysphagia   . Fall   . Hypertension   . Rhabdomyolysis   . Symbolic dysfunction    No past surgical history on file. Social History:   reports that he has never smoked. He has never used smokeless tobacco. He reports that he does not drink alcohol or use drugs.  No family history on  file.  Medications: Patient's Medications  New Prescriptions   No medications on file  Previous Medications   ACETAMINOPHEN (TYLENOL) 325 MG TABLET    Take 650 mg by mouth every 4 (four) hours as needed for mild pain, moderate pain or fever. Not to exceed 3000 mg in 24 hour period.   MENTHOL, TOPICAL ANALGESIC, (BIOFREEZE) 4 % GEL    Apply topically. Every 8 hours as needed to aright knee and hip.   SIMVASTATIN (ZOCOR) 10 MG TABLET    Take 1 tablet (10 mg total) by mouth daily.  Modified Medications   No medications on file  Discontinued Medications   No medications on file     Physical Exam: Vitals:   08/30/16 1414  BP: (!) 97/47  Pulse: 72  Resp: 18  Temp: 97.7 F (36.5 C)  SpO2: 96%  Weight: 131 lb 3.2 oz (59.5 kg)  Height: 5\' 11"  (1.803 m)    Physical Exam  Constitutional: No distress.  Thin, frail  AA male in NAD  HENT:  Head: Normocephalic and atraumatic.  Mouth/Throat: Oropharynx is clear and moist. No oropharyngeal exudate.  Eyes: Conjunctivae are normal. Pupils are equal, round, and reactive to light.  Neck: Normal range of motion. Neck supple.  Cardiovascular: Normal rate, regular rhythm and normal heart sounds.   Pulmonary/Chest: Effort normal and breath sounds normal. No respiratory distress. He has no wheezes. He has no rales.  Abdominal: Soft.  Bowel sounds are normal. He exhibits no distension. There is no tenderness.  Musculoskeletal: He exhibits no edema or tenderness.  Right sided hemiparesis with very little movement to Lower Extremity . Upper weak with limited movement.  Muscle atrophy noted  Neurological: He is alert.  Right sided weakness due to CVA  Skin: Skin is warm and dry. He is not diaphoretic.  Psychiatric: He has a normal mood and affect. He exhibits normal recent memory and normal remote memory.    Labs reviewed: Basic Metabolic Panel:  Recent Labs  12/13/15 06/07/16  NA 138 134*  K 4.1 4.3  BUN 17 13  CREATININE 0.6 0.7    Liver Function Tests:  Recent Labs  12/13/15 08/07/16  AST 15 16  ALT 10 12  ALKPHOS 79 69   No results for input(s): LIPASE, AMYLASE in the last 8760 hours. No results for input(s): AMMONIA in the last 8760 hours. CBC:  Recent Labs  12/13/15 06/07/16  WBC 7.7 8.2  NEUTROABS  --  5,248  HGB 13.1* 13.8  HCT 39* 41  PLT 247 272   TSH: No results for input(s): TSH in the last 8760 hours. A1C: No results found for: HGBA1C Lipid Panel:  Recent Labs  12/13/15 02/05/16 08/07/16  CHOL 125 127 106  HDL 30* 32* 27*  LDLCALC 83 85 62  TRIG 60 52 86    Assessment/Plan 1. Osteoarthrosis involving lower leg -will schedule tylenol 325 mg by mouth 2 tablets TID to help with pain. Cont biofreeze   2. Late effects of CVA (cerebrovascular accident) Stable at this time.   3. Dementia without behavioral disturbance, unspecified dementia type Memory stable, no changes in cognitive or behavioral status   4. Protein-calorie malnutrition, mild (HCC) Weight has been stable  5. Hyperlipidemia, unspecified hyperlipidemia type LDL at goal, conts on zocor 10 mg daily   Yaritzi Craun [name]  [facility] & Adult Medicine [phone] 8 am - 5 pm) [phone] (after hours)

## 2016-09-27 ENCOUNTER — Non-Acute Institutional Stay: Payer: Medicare Other | Admitting: Nurse Practitioner

## 2016-09-27 ENCOUNTER — Encounter: Payer: Self-pay | Admitting: Nurse Practitioner

## 2016-09-27 DIAGNOSIS — M1710 Unilateral primary osteoarthritis, unspecified knee: Secondary | ICD-10-CM | POA: Diagnosis not present

## 2016-09-27 DIAGNOSIS — E785 Hyperlipidemia, unspecified: Secondary | ICD-10-CM | POA: Diagnosis not present

## 2016-09-27 DIAGNOSIS — I6990 Unspecified sequelae of unspecified cerebrovascular disease: Secondary | ICD-10-CM | POA: Diagnosis not present

## 2016-09-27 DIAGNOSIS — IMO0002 Reserved for concepts with insufficient information to code with codable children: Secondary | ICD-10-CM

## 2016-09-27 NOTE — Progress Notes (Signed)
Patient ID: [name] [name], male   DOB: [birth year], 83 y.o.   MRN: [id number]    Nursing Home Location:  [facility] Room: 109 A  Place of Service: SNF (31)  PCP: [name], MD  No Known Allergies  Chief Complaint  Patient presents with  . Medical Management of Chronic Issues    Resident is being seen for routine visit.    HPI:  Patient is a 83 y.o. male seen today at [facility] for for routine follow up of chronic conditions. Pt with a PMH  significant for CVA, CKD, HLD, HTN, OA, prostate CA and dementia. Pt reports he has been doing well over the last month. Continues to have mild pain in bilateral knees, although the tylenol, biofreeze, and PT participation are helping. He has right sided hemiparesis which limits his functional movement. Wheelchair for ambulation, lift for OOB assistance.  Pt denies changes in bowel, bladder, appetite, or sleep. No changes in mood or behavior. Pt is pleasant, A&Ox4 today.   Review of Systems:   Review of Systems  Constitutional: Negative for activity change, appetite change, chills, fatigue, fever and unexpected weight change.  HENT: Negative for rhinorrhea and sore throat.        No dentition. Oral cavity pink, moist with no sores or lesions  Eyes: Negative.   Respiratory: Negative for cough, shortness of breath and wheezing.   Cardiovascular: Negative for chest pain, palpitations and leg swelling.  Gastrointestinal: Negative for abdominal pain, constipation, diarrhea, nausea and vomiting.  Genitourinary: Negative for difficulty urinating, dysuria and frequency.       Has briefs in place, however is aware of urination. +functional incontinence  Musculoskeletal: Positive for arthralgias. Negative for back pain and myalgias.  Skin: Negative for color change and wound.  Neurological: Negative for dizziness and weakness.  Psychiatric/Behavioral: Negative for agitation, behavioral problems and sleep disturbance.    Past  Medical History:  Diagnosis Date  . Buford complex   . Cancer ([place])   . Chronic kidney disease   . Dementia   . Dysphagia   . Fall   . Hypertension   . Rhabdomyolysis   . Symbolic dysfunction    History reviewed. No pertinent surgical history. Social History:   reports that he has never smoked. He has never used smokeless tobacco. He reports that he does not drink alcohol or use drugs.  History reviewed. No pertinent family history.  Medications: Patient's Medications  New Prescriptions   No medications on file  Previous Medications   ACETAMINOPHEN (TYLENOL) 325 MG TABLET    Take 650 mg by mouth every 4 (four) hours as needed for mild pain, moderate pain or fever. Not to exceed 3000 mg in 24 hour period.   MENTHOL, TOPICAL ANALGESIC, (BIOFREEZE) 4 % GEL    Apply topically. Every 8 hours as needed to aright knee and hip.   SIMVASTATIN (ZOCOR) 10 MG TABLET    Take 1 tablet (10 mg total) by mouth daily.  Modified Medications   No medications on file  Discontinued Medications   No medications on file     Physical Exam: Vitals:   09/27/16 0911  BP: 104/65  Pulse: 64  Resp: 18  Temp: 97.3 F (36.3 C)  SpO2: 98%  Weight: 133 lb (60.3 kg)  Height: 5\' 11"  (1.803 m)    Physical Exam  Constitutional: He is oriented to person, place, and time. No distress.  Thin, frail  AA male in NAD  HENT:  Head: Normocephalic  and atraumatic.  Mouth/Throat: Oropharynx is clear and moist. No oropharyngeal exudate.  Eyes: Conjunctivae are normal. Pupils are equal, round, and reactive to light. Right eye exhibits no discharge. Left eye exhibits no discharge.  Neck: Normal range of motion. Neck supple. No JVD present.  Cardiovascular: Normal rate, regular rhythm, S1 normal, S2 normal, normal heart sounds and intact distal pulses.   No murmur heard. Pulses:      Radial pulses are 2+ on the right side, and 2+ on the left side.       Dorsalis pedis pulses are 1+ on the right side, and 1+ on  the left side.  Pulmonary/Chest: Effort normal and breath sounds normal. No respiratory distress. He has no wheezes. He has no rales.  Abdominal: Soft. Bowel sounds are normal. He exhibits no distension. There is no tenderness.  Musculoskeletal: He exhibits no edema or tenderness.  Right sided hemiparesis with very little movement to Lower Extremity . Upper weak with limited movement.  Muscle atrophy noted Bilateral soft protective boots to bilateral feet for protection  Neurological: He is alert and oriented to person, place, and time.  Right sided weakness due to CVA. A&O to person, place, time, and situation. Unable to perform 3 word recall.   Skin: Skin is warm and dry. He is not diaphoretic.  Psychiatric: He has a normal mood and affect. He exhibits normal recent memory and normal remote memory.    Labs reviewed: Basic Metabolic Panel:  Recent Labs  12/13/15 06/04/16 06/07/16  NA 138 135* 134*  K 4.1 4.7 4.3  BUN 17 9 13   CREATININE 0.6 0.7 0.7   Liver Function Tests:  Recent Labs  12/13/15 06/04/16 08/07/16  AST 15 16 16   ALT 10 14 12   ALKPHOS 79 73 69   No results for input(s): LIPASE, AMYLASE in the last 8760 hours. No results for input(s): AMMONIA in the last 8760 hours. CBC:  Recent Labs  12/13/15 06/04/16 06/07/16  WBC 7.7 6.7 8.2  NEUTROABS  --   --  5,248  HGB 13.1* 13.1* 13.8  HCT 39* 39* 41  PLT 247 228 272   TSH: No results for input(s): TSH in the last 8760 hours. A1C: No results found for: HGBA1C Lipid Panel:  Recent Labs  12/13/15 02/05/16 08/07/16  CHOL 125 127 106  HDL 30* 32* 27*  LDLCALC 83 85 62  TRIG 60 52 86    Assessment/Plan  1. Osteoarthrosis involving lower leg -Stable. Scheduled Tylenol 325mg  PO TID and biofreeze 4% applications are helping with pain.  -Continue working with PT for increases in ROM and strength.   -Will continue to monitor for changes, sooner if needed  2. Late effects of CVA (cerebrovascular  accident) -Stable. No s/s of further functional or cognitive decline or decompensation.  3. Hyperlipidemia, unspecified hyperlipidemia type -Stable. LDL at goal, however HDL remains low at 27. Lipid panel 08/07/16= Trig=86, CHO=106, HDL=27, LDL=62. Six months ago HDL=32. Pt is taking Zocor 10mg    Tishawn Friedhoff [name]  [facility] & Adult Medicine [phone] 8 am - 5 pm) [phone] (after hours)

## 2016-10-30 ENCOUNTER — Encounter: Payer: Self-pay | Admitting: Nurse Practitioner

## 2016-10-30 ENCOUNTER — Non-Acute Institutional Stay (SKILLED_NURSING_FACILITY): Payer: Medicare Other | Admitting: Nurse Practitioner

## 2016-10-30 DIAGNOSIS — M1710 Unilateral primary osteoarthritis, unspecified knee: Secondary | ICD-10-CM

## 2016-10-30 DIAGNOSIS — I6990 Unspecified sequelae of unspecified cerebrovascular disease: Secondary | ICD-10-CM

## 2016-10-30 DIAGNOSIS — E441 Mild protein-calorie malnutrition: Secondary | ICD-10-CM | POA: Diagnosis not present

## 2016-10-30 DIAGNOSIS — E785 Hyperlipidemia, unspecified: Secondary | ICD-10-CM

## 2016-10-30 DIAGNOSIS — IMO0002 Reserved for concepts with insufficient information to code with codable children: Secondary | ICD-10-CM

## 2016-10-30 NOTE — Progress Notes (Signed)
Patient ID: [name] [name], male   DOB: August 18, 1931, 83 y.o.   MRN: [id number]    Nursing Home Location:  [facility] Room: 109 A  Place of Service: SNF (31)  PCP: [name], MD  No Known Allergies  Chief Complaint  Patient presents with  . Medical Management of Chronic Issues    Resident is being seen for a routine visit.     HPI:  Patient is a 83 y.o. male seen today at [facility] for for routine follow up of chronic conditions. Pt with a PMH  significant for CVA, CKD, HLD, HTN, OA, prostate CA and dementia. Pt has been doing well in the last month. There has been no acute issues per nursing. Pt has no concerns. Denies pain since on scheduled tylenol.  Appetite remains fair. Did not eat a lot for lunch because he "ate a big breakfast." weight is down from last month but overall stable   Review of Systems:   Review of Systems  Constitutional: Negative for activity change, appetite change, chills, fatigue, fever and unexpected weight change.  HENT: Negative for rhinorrhea and sore throat.        No dentition. Oral cavity pink, moist with no sores or lesions  Eyes: Negative.   Respiratory: Negative for cough, shortness of breath and wheezing.   Cardiovascular: Negative for chest pain, palpitations and leg swelling.  Gastrointestinal: Negative for abdominal pain, constipation, diarrhea, nausea and vomiting.  Genitourinary: Negative for difficulty urinating, dysuria and frequency.       Has briefs in place, however is aware of urination. +functional incontinence  Musculoskeletal: Positive for arthralgias. Negative for back pain and myalgias.  Skin: Negative for color change and wound.  Neurological: Negative for dizziness and weakness.  Psychiatric/Behavioral: Negative for agitation, behavioral problems and sleep disturbance.    Past Medical History:  Diagnosis Date  . Buford complex   . Cancer ([place])   . Chronic kidney disease   . Dementia   .  Dysphagia   . Fall   . Hypertension   . Rhabdomyolysis   . Symbolic dysfunction    History reviewed. No pertinent surgical history. Social History:   reports that he has never smoked. He has never used smokeless tobacco. He reports that he does not drink alcohol or use drugs.  History reviewed. No pertinent family history.  Medications: Patient's Medications  New Prescriptions   No medications on file  Previous Medications   ACETAMINOPHEN (TYLENOL) 325 MG TABLET    Take 650 mg by mouth every 4 (four) hours as needed for mild pain, moderate pain or fever. Not to exceed 3000 mg in 24 hour period.   MENTHOL, TOPICAL ANALGESIC, (BIOFREEZE) 4 % GEL    Apply topically. Every 8 hours as needed to aright knee and hip.   SIMVASTATIN (ZOCOR) 10 MG TABLET    Take 1 tablet (10 mg total) by mouth daily.  Modified Medications   No medications on file  Discontinued Medications   No medications on file     Physical Exam: Vitals:   10/30/16 1338  BP: 112/64  Pulse: 62  Resp: 19  Temp: 97.3 F (36.3 C)  SpO2: 96%  Weight: 130 lb 9.6 oz (59.2 kg)  Height: 5\' 11"  (1.803 m)    Physical Exam  Constitutional: He is oriented to person, place, and time. No distress.  Thin, frail  AA male in NAD  HENT:  Head: Normocephalic and atraumatic.  Mouth/Throat: Oropharynx is clear and moist.  No oropharyngeal exudate.  Eyes: Conjunctivae are normal. Pupils are equal, round, and reactive to light. Right eye exhibits no discharge. Left eye exhibits no discharge.  Neck: Normal range of motion. Neck supple. No JVD present.  Cardiovascular: Normal rate, regular rhythm, S1 normal, S2 normal, normal heart sounds and intact distal pulses.   No murmur heard. Pulses:      Radial pulses are 2+ on the right side, and 2+ on the left side.       Dorsalis pedis pulses are 1+ on the right side, and 1+ on the left side.  Pulmonary/Chest: Effort normal and breath sounds normal. No respiratory distress. He has no  wheezes. He has no rales.  Abdominal: Soft. Bowel sounds are normal. He exhibits no distension. There is no tenderness.  Musculoskeletal: He exhibits no edema or tenderness.  Right sided hemiparesis with very little movement to Lower Extremity . Upper weak with limited movement.  Muscle atrophy noted Bilateral heel protective boots to bilateral feet  Neurological: He is alert and oriented to person, place, and time.  Right sided weakness due to CVA. A&O to person, place, time, and situation.   Skin: Skin is warm and dry. He is not diaphoretic.  Psychiatric: He has a normal mood and affect. He exhibits normal recent memory and normal remote memory.    Labs reviewed: Basic Metabolic Panel:  Recent Labs  12/13/15 06/04/16 06/07/16  NA 138 135* 134*  K 4.1 4.7 4.3  BUN 17 9 13   CREATININE 0.6 0.7 0.7   Liver Function Tests:  Recent Labs  12/13/15 06/04/16 08/07/16  AST 15 16 16   ALT 10 14 12   ALKPHOS 79 73 69   No results for input(s): LIPASE, AMYLASE in the last 8760 hours. No results for input(s): AMMONIA in the last 8760 hours. CBC:  Recent Labs  12/13/15 06/04/16 06/07/16  WBC 7.7 6.7 8.2  NEUTROABS  --   --  5,248  HGB 13.1* 13.1* 13.8  HCT 39* 39* 41  PLT 247 228 272   TSH: No results for input(s): TSH in the last 8760 hours. A1C: No results found for: HGBA1C Lipid Panel:  Recent Labs  12/13/15 02/05/16 08/07/16  CHOL 125 127 106  HDL 30* 32* 27*  LDLCALC 83 85 62  TRIG 60 52 86    Assessment/Plan 1. Hyperlipidemia, unspecified hyperlipidemia type LDL at goal, conts on zocor 10 mg daily   2. Late effects of CVA (cerebrovascular accident) Stable  3. Osteoarthrosis involving lower leg Pain has been stable on scheduled tylenol  4. Protein-calorie malnutrition, mild (HCC) Stable, weight loss noted in the last month but overall weight has been stable over the last 3 months  Savion Washam [name]  [facility] & Adult  Medicine [phone] 8 am - 5 pm) [phone] (after hours)

## 2016-12-04 ENCOUNTER — Encounter: Payer: Self-pay | Admitting: Nurse Practitioner

## 2016-12-04 ENCOUNTER — Non-Acute Institutional Stay (SKILLED_NURSING_FACILITY): Payer: Medicare Other | Admitting: Nurse Practitioner

## 2016-12-04 DIAGNOSIS — M1710 Unilateral primary osteoarthritis, unspecified knee: Secondary | ICD-10-CM

## 2016-12-04 DIAGNOSIS — E785 Hyperlipidemia, unspecified: Secondary | ICD-10-CM | POA: Diagnosis not present

## 2016-12-04 DIAGNOSIS — IMO0002 Reserved for concepts with insufficient information to code with codable children: Secondary | ICD-10-CM

## 2016-12-04 DIAGNOSIS — E538 Deficiency of other specified B group vitamins: Secondary | ICD-10-CM

## 2016-12-04 DIAGNOSIS — I6990 Unspecified sequelae of unspecified cerebrovascular disease: Secondary | ICD-10-CM

## 2016-12-04 DIAGNOSIS — E441 Mild protein-calorie malnutrition: Secondary | ICD-10-CM | POA: Diagnosis not present

## 2016-12-04 NOTE — Progress Notes (Signed)
Patient ID: [name] [name], male   DOB: 07/12/1932, 83 y.o.   MRN: [id number]    Nursing Home Location:  [facility] Room: 109 A  Place of Service: SNF (31)  PCP: [name], MD  No Known Allergies  Chief Complaint  Patient presents with  . Medical Management of Chronic Issues    Resident is being seen for a routine visit.     HPI:  Patient is a 83 y.o. male seen today at [facility] for for routine follow up of chronic conditions. Pt with a PMH  significant for CVA, CKD, HLD, HTN, OA, prostate CA and dementia. Pt has been doing well in the last month, no acute issues or changes to chronic conditions. Pt denies pain. No changes in behavior or mood. Pt reports he will get up occasionally however in bed today.   Review of Systems:   Review of Systems  Constitutional: Negative for activity change, appetite change, chills, fatigue, fever and unexpected weight change.  HENT: Negative for rhinorrhea and sore throat.        No dentition. Oral cavity pink, moist with no sores or lesions  Eyes: Negative.   Respiratory: Negative for cough, shortness of breath and wheezing.   Cardiovascular: Negative for chest pain, palpitations and leg swelling.  Gastrointestinal: Negative for abdominal pain, constipation, diarrhea, nausea and vomiting.  Genitourinary: Negative for difficulty urinating, dysuria and frequency.       Has briefs in place, however is aware of urination. +functional incontinence  Musculoskeletal: Positive for arthralgias. Negative for back pain and myalgias.  Skin: Negative for color change and wound.  Neurological: Negative for dizziness and weakness.  Psychiatric/Behavioral: Negative for agitation, behavioral problems and sleep disturbance.    Past Medical History:  Diagnosis Date  . Buford complex   . Cancer ([place])   . Chronic kidney disease   . Dementia   . Dysphagia   . Fall   . Hypertension   . Rhabdomyolysis   . Symbolic dysfunction      History reviewed. No pertinent surgical history. Social History:   reports that he has never smoked. He has never used smokeless tobacco. He reports that he does not drink alcohol or use drugs.  History reviewed. No pertinent family history.  Medications: Patient's Medications  New Prescriptions   No medications on file  Previous Medications   ACETAMINOPHEN (TYLENOL) 325 MG TABLET    Take 650 mg by mouth every 4 (four) hours as needed for mild pain, moderate pain or fever. Not to exceed 3000 mg in 24 hour period.   MENTHOL, TOPICAL ANALGESIC, (BIOFREEZE) 4 % GEL    Apply topically. Every 8 hours as needed to aright knee and hip.   SIMVASTATIN (ZOCOR) 10 MG TABLET    Take 1 tablet (10 mg total) by mouth daily.  Modified Medications   No medications on file  Discontinued Medications   No medications on file     Physical Exam: Vitals:   12/04/16 1116  BP: 129/71  Pulse: 72  Resp: 20  Temp: 97.3 F (36.3 C)  SpO2: 95%  Weight: 131 lb 9.6 oz (59.7 kg)  Height: 5\' 11"  (1.803 m)    Physical Exam  Constitutional: He is oriented to person, place, and time. No distress.  Thin, frail  AA male in NAD  HENT:  Head: Normocephalic and atraumatic.  Mouth/Throat: Oropharynx is clear and moist. No oropharyngeal exudate.  Eyes: Conjunctivae are normal. Pupils are equal, round, and reactive  to light. Right eye exhibits no discharge. Left eye exhibits no discharge.  Neck: Normal range of motion. Neck supple. No JVD present.  Cardiovascular: Normal rate, regular rhythm, S1 normal, S2 normal, normal heart sounds and intact distal pulses.   No murmur heard. Pulses:      Radial pulses are 2+ on the right side, and 2+ on the left side.       Dorsalis pedis pulses are 1+ on the right side, and 1+ on the left side.  Pulmonary/Chest: Effort normal and breath sounds normal. No respiratory distress. He has no wheezes. He has no rales.  Abdominal: Soft. Bowel sounds are normal. He exhibits no  distension. There is no tenderness.  Musculoskeletal: He exhibits no edema or tenderness.  Right sided hemiparesis with very little movement to Lower Extremity . Upper weak with limited movement.  Muscle atrophy noted Bilateral heel protective boots to bilateral feet  Neurological: He is alert and oriented to person, place, and time.  Right sided weakness due to CVA. A&O to person, place, time, and situation.   Skin: Skin is warm and dry. He is not diaphoretic.  Psychiatric: He has a normal mood and affect. He exhibits normal recent memory and normal remote memory.    Labs reviewed: Basic Metabolic Panel:  Recent Labs  12/13/15 06/04/16 06/07/16  NA 138 135* 134*  K 4.1 4.7 4.3  BUN 17 9 13   CREATININE 0.6 0.7 0.7   Liver Function Tests:  Recent Labs  12/13/15 06/04/16 08/07/16  AST 15 16 16   ALT 10 14 12   ALKPHOS 79 73 69   No results for input(s): LIPASE, AMYLASE in the last 8760 hours. No results for input(s): AMMONIA in the last 8760 hours. CBC:  Recent Labs  12/13/15 06/04/16 06/07/16  WBC 7.7 6.7 8.2  NEUTROABS  --   --  5,248  HGB 13.1* 13.1* 13.8  HCT 39* 39* 41  PLT 247 228 272   TSH: No results for input(s): TSH in the last 8760 hours. A1C: No results found for: HGBA1C Lipid Panel:  Recent Labs  12/13/15 02/05/16 08/07/16  CHOL 125 127 106  HDL 30* 32* 27*  LDLCALC 83 85 62  TRIG 60 52 86    Assessment/Plan 1. Hyperlipidemia, unspecified hyperlipidemia type Stable in January, cont current regimen, will follow up CMP   2. Late effects of CVA (cerebrovascular accident) Remains stable, no worsening or progression of symptoms.   3. Osteoarthrosis involving lower leg Stable on scheduled tylenol  4. Protein-calorie malnutrition, mild (HCC) Thin male but weights have remained stable, conts on supplements  5. Vit B12 deficiency Not currently on supplement, Will follow up [facility]. Harle Battiest  [facility] [phone] 8 am - 5 pm) [phone] (after hours)

## 2016-12-05 DIAGNOSIS — E561 Deficiency of vitamin K: Secondary | ICD-10-CM | POA: Diagnosis not present

## 2016-12-05 DIAGNOSIS — D649 Anemia, unspecified: Secondary | ICD-10-CM | POA: Diagnosis not present

## 2016-12-05 DIAGNOSIS — I129 Hypertensive chronic kidney disease with stage 1 through stage 4 chronic kidney disease, or unspecified chronic kidney disease: Secondary | ICD-10-CM | POA: Diagnosis not present

## 2016-12-05 DIAGNOSIS — I69153 Hemiplegia and hemiparesis following nontraumatic intracerebral hemorrhage affecting right non-dominant side: Secondary | ICD-10-CM | POA: Diagnosis not present

## 2016-12-05 DIAGNOSIS — M6282 Rhabdomyolysis: Secondary | ICD-10-CM | POA: Diagnosis not present

## 2016-12-05 LAB — BASIC METABOLIC PANEL
BUN: 13 mg/dL (ref 4–21)
Creatinine: 0.6 mg/dL (ref 0.6–1.3)
GLUCOSE: 87 mg/dL
Potassium: 4 mmol/L (ref 3.4–5.3)
SODIUM: 143 mmol/L (ref 137–147)

## 2016-12-05 LAB — CBC AND DIFFERENTIAL
HEMATOCRIT: 37 % — AB (ref 41–53)
HEMOGLOBIN: 11.8 g/dL — AB (ref 13.5–17.5)
Platelets: 272 10*3/uL (ref 150–399)
WBC: 6.3 10^3/mL

## 2016-12-05 LAB — HEPATIC FUNCTION PANEL
ALK PHOS: 50 U/L (ref 25–125)
ALT: 9 U/L — AB (ref 10–40)
AST: 15 U/L (ref 14–40)
Bilirubin, Total: 0.3 mg/dL

## 2016-12-10 DIAGNOSIS — D649 Anemia, unspecified: Secondary | ICD-10-CM | POA: Diagnosis not present

## 2016-12-10 DIAGNOSIS — E538 Deficiency of other specified B group vitamins: Secondary | ICD-10-CM | POA: Diagnosis not present

## 2017-01-08 ENCOUNTER — Encounter: Payer: Self-pay | Admitting: Adult Health

## 2017-01-08 ENCOUNTER — Non-Acute Institutional Stay (SKILLED_NURSING_FACILITY): Payer: Medicare Other | Admitting: Adult Health

## 2017-01-08 DIAGNOSIS — E538 Deficiency of other specified B group vitamins: Secondary | ICD-10-CM | POA: Diagnosis not present

## 2017-01-08 DIAGNOSIS — D649 Anemia, unspecified: Secondary | ICD-10-CM

## 2017-01-08 DIAGNOSIS — IMO0002 Reserved for concepts with insufficient information to code with codable children: Secondary | ICD-10-CM

## 2017-01-08 DIAGNOSIS — E785 Hyperlipidemia, unspecified: Secondary | ICD-10-CM | POA: Diagnosis not present

## 2017-01-08 DIAGNOSIS — M1710 Unilateral primary osteoarthritis, unspecified knee: Secondary | ICD-10-CM

## 2017-01-08 NOTE — Progress Notes (Signed)
DATE:  01/08/2017   MRN:  [id number]  BIRTHDAY: Jul 21, 1932  Facility:  Nursing Home Location:  [facility] Room Number: 109-A  LEVEL OF CARE:  SNF (31)  Contact Information    Name Relation Home Work Mobile   Marcell,[name] Daughter [phone]         Code Status History    Date Active Date Inactive Code Status Order ID Comments User Context   02/12/2014  8:45 AM 02/15/2014  8:13 PM Full Code [id number]  Nolon Rod, DO Inpatient   02/12/2014  6:09 AM 02/12/2014  8:45 AM Full Code [id number]  Thad Ranger, MD ED   12/29/2013  5:30 PM 12/30/2013  9:00 PM Full Code [id number]  Ma Hillock, DO Inpatient   12/17/2013 12:55 PM 12/29/2013  5:30 PM Full Code [id number]  Hennie Duos, MD Outpatient   12/12/2013  5:54 PM 12/15/2013  8:16 PM Full Code [id number]  Nolon Rod, DO Inpatient   12/12/2013  5:25 PM 12/12/2013  5:54 PM Full Code [id number]  Nolon Rod, DO ED       Chief Complaint  Patient presents with  . Medical Management of Chronic Issues    Routine    HISTORY OF PRESENT ILLNESS:  This is an 85-YO male seen for a routine visit.  He is a long-term care resident of [facility]. He has PMH of CVA, CKD, hyperlipidemia, hypertension, osteoarthritis, prostate cancer and dementia. He was recently started on Vitamin B12  Injections for B12 deficiency.    PAST MEDICAL HISTORY:  Past Medical History:  Diagnosis Date  . Buford complex   . Cancer ([place])   . Chronic kidney disease   . Dementia   . Dysphagia   . Fall   . Hypertension   . Rhabdomyolysis   . Symbolic dysfunction      CURRENT MEDICATIONS: Reviewed  Patient's Medications  New Prescriptions   No medications on file  Previous Medications   ACETAMINOPHEN (TYLENOL) 325 MG TABLET    Take 650 mg by mouth every 4 (four) hours as needed for mild pain, moderate pain or fever. Not to exceed 3000 mg in 24 hour period.   ACETAMINOPHEN (TYLENOL) 650 MG CR TABLET     Take 650 mg by mouth 3 (three) times daily. Scheduled   CYANOCOBALAMIN (B-12 COMPLIANCE INJECTION) 1000 MCG/ML KIT    Inject 1,000 mcg as directed once a week.   MENTHOL, TOPICAL ANALGESIC, (BIOFREEZE) 4 % GEL    Apply topically. Every 8 hours as needed to aright knee and hip.   SIMVASTATIN (ZOCOR) 10 MG TABLET    Take 1 tablet (10 mg total) by mouth daily.   VITAMIN B-12 (CYANOCOBALAMIN) 1000 MCG TABLET    Take 1,000 mcg by mouth daily.  Modified Medications   No medications on file  Discontinued Medications   No medications on file     No Known Allergies   REVIEW OF SYSTEMS:  GENERAL: no change in appetite, no fatigue, no weight changes, no fever, chills or weakness EYES: Denies change in vision, dry eyes, eye pain, itching or discharge EARS: Denies change in hearing, ringing in ears, or earache NOSE: Denies nasal congestion or epistaxis MOUTH and THROAT: Denies oral discomfort, gingival pain or bleeding, pain from teeth or hoarseness   RESPIRATORY: no cough, SOB, DOE, wheezing, hemoptysis CARDIAC: no chest pain, edema or palpitations GI: no abdominal pain, diarrhea, constipation, heart burn, nausea or vomiting GU: Denies  dysuria, frequency, hematuria, incontinence, or discharge PSYCHIATRIC: Denies feeling of depression or anxiety. No report of hallucinations, insomnia, paranoia, or agitation   PHYSICAL EXAMINATION  GENERAL APPEARANCE:  In no acute distress.  SKIN:  Skin is warm and dry.  HEAD: Normal in size and contour. No evidence of trauma EYES: Lids open and close normally. No blepharitis, entropion or ectropion. PERRL. Conjunctivae are clear and sclerae are white. Lenses are without opacity EARS: Pinnae are normal. Patient hears normal voice tunes of the examiner MOUTH and THROAT: Lips are without lesions. Oral mucosa is moist and without lesions. Tongue is normal in shape, size, and color and without lesions NECK: supple, trachea midline, no neck masses, no thyroid  tenderness, no thyromegaly LYMPHATICS: no LAN in the neck, no supraclavicular LAN RESPIRATORY: breathing is even & unlabored, BS CTAB CARDIAC: RRR, no murmur,no extra heart sounds, no edema GI: abdomen soft, normal BS, no masses, no tenderness, no hepatomegaly, no splenomegaly EXTREMITIES:  Able to move X 4 extremities, BLE has generalized weakness  PSYCHIATRIC: Alert and oriented X 3. Affect and behavior are appropriate   LABS/RADIOLOGY: Labs reviewed: Basic Metabolic Panel:  Recent Labs  06/04/16 06/07/16 12/05/16  NA 135* 134* 143  K 4.7 4.3 4.0  BUN _0 CREATININE 0.7 0.7 0.6   Liver Function Tests:  Recent Labs  06/04/16 08/07/16 12/05/16  AST _1 ALT 14 12 9*  ALKPHOS 73 69 50   CBC:  Recent Labs  06/04/16 06/07/16 12/05/16  WBC 6.7 8.2 6.3  NEUTROABS  --  5,248  --   HGB 13.1* 13.8 11.8*  HCT 39* 41 37*  PLT 228 272 272   Lipid Panel:  Recent Labs  02/05/16 08/07/16  HDL 32* 27*    ASSESSMENT/PLAN:  1. Vitamin B12 deficiency - continue Vitamin B12 1,000 mcg injection subcutaneous weekly until 01/21/17 Lab Results  Component Value Date   VITAMINB12 100 (L) 12/13/2013     2. Hyperlipidemia, unspecified hyperlipidemia type - continue simvastatin 10 mg 1 tab by mouth daily at bedtime Lab Results  Component Value Date   CHOL 106 08/07/2016   HDL 27 (A) 08/07/2016   LDLCALC 62 08/07/2016   TRIG 86 08/07/2016   CHOLHDL 5.3 09/10/2013     3. Osteoarthrosis involving lower leg - continue Tylenol 650 mg by mouth 3 times a day  4. Anemia - stable Lab Results  Component Value Date   HGB 11.8 (A) 12/05/2016     Goals of care:  Long-term care   Monina C. Stillwater - NP    [employer] [phone]

## 2017-02-04 ENCOUNTER — Encounter: Payer: Self-pay | Admitting: Adult Health

## 2017-02-04 NOTE — Progress Notes (Signed)
This encounter was created in error - please disregard.

## 2017-02-05 ENCOUNTER — Encounter: Payer: Self-pay | Admitting: Adult Health

## 2017-02-05 ENCOUNTER — Non-Acute Institutional Stay (SKILLED_NURSING_FACILITY): Payer: Medicare Other | Admitting: Adult Health

## 2017-02-05 DIAGNOSIS — E538 Deficiency of other specified B group vitamins: Secondary | ICD-10-CM | POA: Diagnosis not present

## 2017-02-05 DIAGNOSIS — M1710 Unilateral primary osteoarthritis, unspecified knee: Secondary | ICD-10-CM | POA: Diagnosis not present

## 2017-02-05 DIAGNOSIS — E785 Hyperlipidemia, unspecified: Secondary | ICD-10-CM | POA: Diagnosis not present

## 2017-02-05 DIAGNOSIS — F0390 Unspecified dementia without behavioral disturbance: Secondary | ICD-10-CM

## 2017-02-05 DIAGNOSIS — D649 Anemia, unspecified: Secondary | ICD-10-CM | POA: Diagnosis not present

## 2017-02-05 DIAGNOSIS — IMO0002 Reserved for concepts with insufficient information to code with codable children: Secondary | ICD-10-CM

## 2017-02-05 NOTE — Progress Notes (Signed)
DATE:  02/05/2017   MRN:  [id number]  BIRTHDAY: 10/15/1931  Facility:  Nursing Home Location:  [facility] Room Number: 305-A  LEVEL OF CARE:  SNF (31)  Contact Information    Name Relation Home Work Mobile   Tomassetti,[name] Daughter [phone]         Code Status History    Date Active Date Inactive Code Status Order ID Comments User Context   02/12/2014  8:45 AM 02/15/2014  8:13 PM Full Code [id number]  Nolon Rod, DO Inpatient   02/12/2014  6:09 AM 02/12/2014  8:45 AM Full Code [id number]  Thad Ranger, MD ED   12/29/2013  5:30 PM 12/30/2013  9:00 PM Full Code [id number]  Ma Hillock, DO Inpatient   12/17/2013 12:55 PM 12/29/2013  5:30 PM Full Code [id number]  Hennie Duos, MD Outpatient   12/12/2013  5:54 PM 12/15/2013  8:16 PM Full Code [id number]  Nolon Rod, DO Inpatient   12/12/2013  5:25 PM 12/12/2013  5:54 PM Full Code [id number]  Nolon Rod, DO ED       Chief Complaint  Patient presents with  . Medical Management of Chronic Issues    Routine visit    HISTORY OF PRESENT ILLNESS:  This is an 83 year old male who is being seen for a routine visit. He is a long-term resident of [facility]. He was recently discharged from OT services. He was seen in his room today. He denies any concerns. He has PMH of CVA, CKD, hyperlipidemia, hypertension, osteoarthritis, prostate cancer and dementia.   PAST MEDICAL HISTORY:  Past Medical History:  Diagnosis Date  . Buford complex   . Cancer ([place])   . Chronic kidney disease   . Dementia   . Dysphagia   . Fall   . Hypertension   . Rhabdomyolysis   . Symbolic dysfunction      CURRENT MEDICATIONS: Reviewed  Patient's Medications  New Prescriptions   No medications on file  Previous Medications   ACETAMINOPHEN (TYLENOL) 325 MG TABLET    Take 650 mg by mouth every 4 (four) hours as needed for mild pain, moderate pain or fever. Not to exceed 3000 mg in 24 hour period.   ACETAMINOPHEN (TYLENOL) 650 MG CR TABLET    Take 650 mg by mouth 3 (three) times daily. Scheduled   MENTHOL, TOPICAL ANALGESIC, (BIOFREEZE) 4 % GEL    Apply topically. Every 8 hours as needed to aright knee and hip.   SIMVASTATIN (ZOCOR) 10 MG TABLET    Take 1 tablet (10 mg total) by mouth daily.   VITAMIN B-12 (CYANOCOBALAMIN) 1000 MCG TABLET    Take 1,000 mcg by mouth daily.  Modified Medications   No medications on file  Discontinued Medications   No medications on file     No Known Allergies   REVIEW OF SYSTEMS:  GENERAL: no change in appetite, no fatigue, no weight changes, no fever, chills or weakness EARS: Denies change in hearing, ringing in ears, or earache NOSE: Denies nasal congestion or epistaxis MOUTH and THROAT: Denies oral discomfort, gingival pain or bleeding, pain from teeth or hoarseness   RESPIRATORY: no cough, SOB, DOE, wheezing, hemoptysis CARDIAC: no chest pain, edema or palpitations GI: no abdominal pain, diarrhea, constipation, heart burn, nausea or vomiting GU: Denies dysuria, frequency, hematuria, incontinence, or discharge PSYCHIATRIC: Denies feeling of depression or anxiety. No report of hallucinations, insomnia, paranoia, or agitation     PHYSICAL  EXAMINATION  GENERAL APPEARANCE: Well nourished. In no acute distress. Normal body habitus SKIN:  Skin is warm and dry.  HEAD: Normal in size and contour. No evidence of trauma EYES: Lids open and close normally. No blepharitis, entropion or ectropion.  EARS: Pinnae are normal. Patient hears normal voice tunes of the examiner MOUTH and THROAT: Lips are without lesions. Oral mucosa is moist and without lesions. Tongue is normal in shape, size, and color and without lesions NECK: supple, trachea midline, no neck masses, no thyroid tenderness, no thyromegaly LYMPHATICS: no LAN in the neck, no supraclavicular LAN RESPIRATORY: breathing is even & unlabored, BS CTAB CARDIAC: RRR, no murmur,no extra heart  sounds, no edema GI: abdomen soft, normal BS, no masses, no tenderness, no hepatomegaly, no splenomegaly EXTREMITIES:  Able to move X 4 extremities, BLE has generalized weakness PSYCHIATRIC: Alert and oriented X 3. Affect and behavior are appropriate   LABS/RADIOLOGY: Labs reviewed: Basic Metabolic Panel:  Recent Labs  06/04/16 06/07/16 12/05/16  NA 135* 134* 143  K 4.7 4.3 4.0  BUN 9 13 13   CREATININE 0.7 0.7 0.6   Liver Function Tests:  Recent Labs  06/04/16 08/07/16 12/05/16  AST 16 16 15   ALT 14 12 9*  ALKPHOS 73 69 50   CBC:  Recent Labs  06/04/16 06/07/16 12/05/16  WBC 6.7 8.2 6.3  NEUTROABS  --  5,248  --   HGB 13.1* 13.8 11.8*  HCT 39* 41 37*  PLT 228 272 272   Lipid Panel:  Recent Labs  08/07/16  HDL 27*    B12:   12/10/16 <150  ASSESSMENT/PLAN:  1. Hyperlipidemia, unspecified hyperlipidemia type - continue simvastatin 10 mg 1 tab by mouth daily at bedtime Lab Results  Component Value Date   CHOL 106 08/07/2016   HDL 27 (A) 08/07/2016   LDLCALC 62 08/07/2016   TRIG 86 08/07/2016   CHOLHDL 5.3 09/10/2013     2. Anemia, unspecified type - stable Lab Results  Component Value Date   HGB 11.8 (A) 12/05/2016     3. Vitamin B12 deficiency - continue vitamin B12 1000 g 1 tab by mouth daily   4. Osteoarthrosis involving lower leg - continue Tylenol 650 mg by mouth 3 times a day   5. Dementia without behavioral disturbance, unspecified dementia type - continue supportive care, fall precautions      Goals of care:  Long-term care    Monina C. Duncan - NP    [employer] [phone]

## 2017-02-07 DIAGNOSIS — B351 Tinea unguium: Secondary | ICD-10-CM | POA: Diagnosis not present

## 2017-02-07 DIAGNOSIS — I739 Peripheral vascular disease, unspecified: Secondary | ICD-10-CM | POA: Diagnosis not present

## 2017-02-20 ENCOUNTER — Non-Acute Institutional Stay (SKILLED_NURSING_FACILITY): Payer: Medicare Other

## 2017-02-20 DIAGNOSIS — Z0000 Encounter for general adult medical examination without abnormal findings: Secondary | ICD-10-CM | POA: Diagnosis not present

## 2017-02-20 NOTE — Patient Instructions (Signed)
[name] [name] , Thank you for taking time to come for your Medicare Wellness Visit. I appreciate your ongoing commitment to your health goals. Please review the following plan we discussed and let me know if I can assist you in the future.   Screening recommendations/referrals: Colonoscopy excluded, pt over age 81 Recommended yearly ophthalmology/optometry visit for glaucoma screening and checkup Recommended yearly dental visit for hygiene and checkup  Vaccinations: Influenza vaccine up to date. Due 2018 fall season Pneumococcal vaccine up to date Tdap vaccine up to date. Due 9/31/21 Shingles vaccine not in records  Advanced directives: DNR in chart, copies of health care power of attorney and living will are needed   Conditions/risks identified: None  Next appointment: Dr. Linna Darner makes rounds  Preventive Care 62 Years and Older, Male Preventive care refers to lifestyle choices and visits with your health care provider that can promote health and wellness. What does preventive care include?  A yearly physical exam. This is also called an annual well check.  Dental exams once or twice a year.  Routine eye exams. Ask your health care provider how often you should have your eyes checked.  Personal lifestyle choices, including:  Daily care of your teeth and gums.  Regular physical activity.  Eating a healthy diet.  Avoiding tobacco and drug use.  Limiting alcohol use.  Practicing safe sex.  Taking low doses of aspirin every day.  Taking vitamin and mineral supplements as recommended by your health care provider. What happens during an annual well check? The services and screenings done by your health care provider during your annual well check will depend on your age, overall health, lifestyle risk factors, and family history of disease. Counseling  Your health care provider may ask you questions about your:  Alcohol use.  Tobacco use.  Drug use.  Emotional  well-being.  Home and relationship well-being.  Sexual activity.  Eating habits.  History of falls.  Memory and ability to understand (cognition).  Work and work Statistician. Screening  You may have the following tests or measurements:  Height, weight, and BMI.  Blood pressure.  Lipid and cholesterol levels. These may be checked every 5 years, or more frequently if you are over 32 years old.  Skin check.  Lung cancer screening. You may have this screening every year starting at age 60 if you have a 30-pack-year history of smoking and currently smoke or have quit within the past 15 years.  Fecal occult blood test (FOBT) of the stool. You may have this test every year starting at age 57.  Flexible sigmoidoscopy or colonoscopy. You may have a sigmoidoscopy every 5 years or a colonoscopy every 10 years starting at age 37.  Prostate cancer screening. Recommendations will vary depending on your family history and other risks.  Hepatitis C blood test.  Hepatitis B blood test.  Sexually transmitted disease (STD) testing.  Diabetes screening. This is done by checking your blood sugar (glucose) after you have not eaten for a while (fasting). You may have this done every 1-3 years.  Abdominal aortic aneurysm (AAA) screening. You may need this if you are a current or former smoker.  Osteoporosis. You may be screened starting at age 7 if you are at high risk. Talk with your health care provider about your test results, treatment options, and if necessary, the need for more tests. Vaccines  Your health care provider may recommend certain vaccines, such as:  Influenza vaccine. This is recommended every year.  Tetanus,  diphtheria, and acellular pertussis (Tdap, Td) vaccine. You may need a Td booster every 10 years.  Zoster vaccine. You may need this after age 18.  Pneumococcal 13-valent conjugate (PCV13) vaccine. One dose is recommended after age 56.  Pneumococcal  polysaccharide (PPSV23) vaccine. One dose is recommended after age 36. Talk to your health care provider about which screenings and vaccines you need and how often you need them. This information is not intended to replace advice given to you by your health care provider. Make sure you discuss any questions you have with your health care provider. Document Released: 08/11/2015 Document Revised: 04/03/2016 Document Reviewed: 05/16/2015 Elsevier Interactive Patient Education  2017 [employer] Prevention in the Home Falls can cause injuries. They can happen to people of all ages. There are many things you can do to make your home safe and to help prevent falls. What can I do on the outside of my home?  Regularly fix the edges of walkways and driveways and fix any cracks.  Remove anything that might make you trip as you walk through a door, such as a raised step or threshold.  Trim any bushes or trees on the path to your home.  Use bright outdoor lighting.  Clear any walking paths of anything that might make someone trip, such as rocks or tools.  Regularly check to see if handrails are loose or broken. Make sure that both sides of any steps have handrails.  Any raised decks and porches should have guardrails on the edges.  Have any leaves, snow, or ice cleared regularly.  Use sand or salt on walking paths during winter.  Clean up any spills in your garage right away. This includes oil or grease spills. What can I do in the bathroom?  Use night lights.  Install grab bars by the toilet and in the tub and shower. Do not use towel bars as grab bars.  Use non-skid mats or decals in the tub or shower.  If you need to sit down in the shower, use a plastic, non-slip stool.  Keep the floor dry. Clean up any water that spills on the floor as soon as it happens.  Remove soap buildup in the tub or shower regularly.  Attach bath mats securely with double-sided non-slip rug  tape.  Do not have throw rugs and other things on the floor that can make you trip. What can I do in the bedroom?  Use night lights.  Make sure that you have a light by your bed that is easy to reach.  Do not use any sheets or blankets that are too big for your bed. They should not hang down onto the floor.  Have a firm chair that has side arms. You can use this for support while you get dressed.  Do not have throw rugs and other things on the floor that can make you trip. What can I do in the kitchen?  Clean up any spills right away.  Avoid walking on wet floors.  Keep items that you use a lot in easy-to-reach places.  If you need to reach something above you, use a strong step stool that has a grab bar.  Keep electrical cords out of the way.  Do not use floor polish or wax that makes floors slippery. If you must use wax, use non-skid floor wax.  Do not have throw rugs and other things on the floor that can make you trip. What can I do with  my stairs?  Do not leave any items on the stairs.  Make sure that there are handrails on both sides of the stairs and use them. Fix handrails that are broken or loose. Make sure that handrails are as long as the stairways.  Check any carpeting to make sure that it is firmly attached to the stairs. Fix any carpet that is loose or worn.  Avoid having throw rugs at the top or bottom of the stairs. If you do have throw rugs, attach them to the floor with carpet tape.  Make sure that you have a light switch at the top of the stairs and the bottom of the stairs. If you do not have them, ask someone to add them for you. What else can I do to help prevent falls?  Wear shoes that:  Do not have high heels.  Have rubber bottoms.  Are comfortable and fit you well.  Are closed at the toe. Do not wear sandals.  If you use a stepladder:  Make sure that it is fully opened. Do not climb a closed stepladder.  Make sure that both sides of the  stepladder are locked into place.  Ask someone to hold it for you, if possible.  Clearly mark and make sure that you can see:  Any grab bars or handrails.  First and last steps.  Where the edge of each step is.  Use tools that help you move around (mobility aids) if they are needed. These include:  Canes.  Walkers.  Scooters.  Crutches.  Turn on the lights when you go into a dark area. Replace any light bulbs as soon as they burn out.  Set up your furniture so you have a clear path. Avoid moving your furniture around.  If any of your floors are uneven, fix them.  If there are any pets around you, be aware of where they are.  Review your medicines with your doctor. Some medicines can make you feel dizzy. This can increase your chance of falling. Ask your doctor what other things that you can do to help prevent falls. This information is not intended to replace advice given to you by your health care provider. Make sure you discuss any questions you have with your health care provider. Document Released: 05/11/2009 Document Revised: 12/21/2015 Document Reviewed: 08/19/2014 Elsevier Interactive Patient Education  2017 [employer].

## 2017-02-20 NOTE — Progress Notes (Signed)
Subjective:   [name] [name] is a 83 y.o. male who presents for an Initial Medicare Annual Wellness Visit at [facility]    Objective:    Today's Vitals   02/20/17 1209  BP: 110/60  Pulse: 65  Temp: (!) 97.4 F (36.3 C)  TempSrc: Oral  SpO2: 96%  Weight: 131 lb (59.4 kg)  Height: 5\' 11"  (1.803 m)   Body mass index is 18.27 kg/m.  Current Medications (verified) Outpatient Encounter Prescriptions as of 02/20/2017  Medication Sig  . acetaminophen (TYLENOL) 325 MG tablet Take 650 mg by mouth every 4 (four) hours as needed for mild pain, moderate pain or fever. Not to exceed 3000 mg in 24 hour period.  Marland Kitchen acetaminophen (TYLENOL) 650 MG CR tablet Take 650 mg by mouth 3 (three) times daily. Scheduled  . Menthol, Topical Analgesic, (BIOFREEZE) 4 % GEL Apply topically. Every 8 hours as needed to aright knee and hip.  . simvastatin (ZOCOR) 10 MG tablet Take 1 tablet (10 mg total) by mouth daily.  . vitamin B-12 (CYANOCOBALAMIN) 1000 MCG tablet Take 1,000 mcg by mouth daily.   No facility-administered encounter medications on file as of 02/20/2017.     Allergies (verified) Patient has no known allergies.   History: Past Medical History:  Diagnosis Date  . Buford complex   . Cancer ([place])   . Chronic kidney disease   . Dementia   . Dysphagia   . Fall   . Hypertension   . Rhabdomyolysis   . Symbolic dysfunction    History reviewed. No pertinent surgical history. History reviewed. No pertinent family history. Social History   Occupational History  . Not on file.   Social History Main Topics  . Smoking status: Current Every Day Smoker    Packs/day: 0.25    Years: 10.00    Types: Cigarettes  . Smokeless tobacco: Never Used  . Alcohol use No  . Drug use: No  . Sexual activity: No   Tobacco Counseling Ready to quit: Not Answered Counseling given: Not Answered   Activities of Daily Living In your present state of health, do you have any difficulty  performing the following activities: 02/20/2017  Hearing? Y  Vision? Y  Difficulty concentrating or making decisions? Y  Walking or climbing stairs? Y  Dressing or bathing? Y  Doing errands, shopping? Y  Preparing Food and eating ? Y  Using the Toilet? Y  In the past six months, have you accidently leaked urine? Y  Do you have problems with loss of bowel control? Y  Managing your Medications? Y  Managing your Finances? Y  Housekeeping or managing your Housekeeping? Y  Some recent data might be hidden    Immunizations and Health Maintenance Immunization History  Administered Date(s) Administered  . Influenza Split 05/08/2011, 05/07/2012  . Influenza Whole 04/22/2008, 05/25/2009  . Influenza-Unspecified 05/02/2014, 05/10/2015, 05/02/2016  . PPD Test 12/15/2013, 01/16/2015, 02/15/2015  . Pneumococcal Polysaccharide-23 04/10/2010, 12/15/2013  . Td 04/10/2010   There are no preventive care reminders to display for this patient.  Patient Care Team: [name], MD as PCP - General (Internal Medicine) Rehab, [facility] ([place])  Indicate any recent Medical Services you may have received from other than [facility] providers in the past year (date may be approximate).    Assessment:   This is a routine wellness examination for [name] [name].   Hearing/Vision screen No exam data present  Dietary issues and exercise activities discussed: Current Exercise  Habits: The patient does not participate in regular exercise at present, Exercise limited by: orthopedic condition(s);neurologic condition(s)  Goals    None     Depression Screen PHQ 2/9 Scores 02/20/2017 05/07/2012 11/20/2011  PHQ - 2 Score 0 0 0    Fall Risk Fall Risk  02/20/2017 08/30/2016 04/12/2016 03/22/2016 05/07/2012  Falls in the past year? No No No No -  Risk for fall due to : - - - - Impaired balance/gait;Impaired mobility    Cognitive Function:     6CIT Screen 02/20/2017  What Year? 0 points   What month? 0 points  What time? 0 points  Count back from 20 4 points  Months in reverse 4 points  Repeat phrase 10 points  Total Score 18    Screening Tests Health Maintenance  Topic Date Due  . PNA vac Low Risk Adult (2 of 2 - PCV13) 10/13/2043 (Originally 12/16/2014)  . INFLUENZA VACCINE  02/26/2017  . TETANUS/TDAP  04/10/2020        Plan:  I have personally reviewed and addressed the Medicare Annual Wellness questionnaire and have noted the following in the patient's chart:  A. Medical and social history B. Use of alcohol, tobacco or illicit drugs  C. Current medications and supplements D. Functional ability and status E.  Nutritional status F.  Physical activity G. Advance directives H. List of other physicians I.  Hospitalizations, surgeries, and ER visits in previous 12 months J.  [place] to include hearing, vision, cognitive, depression L. Referrals and appointments - none  In addition, I have reviewed and discussed with patient certain preventive protocols, quality metrics, and best practice recommendations. A written personalized care plan for preventive services as well as general preventive health recommendations were provided to patient.  See attached scanned questionnaire for additional information.   Signed,   Rich Reining, RN Nurse Health Advisor   Quick Notes   Health Maintenance: Up to date     Abnormal Screen: 6 CIT-18     Patient Concerns: None     Nurse Concerns: None  I have personally reviewed the health advisor's clinical note, was available for consultation, and agree with the assessment and plan as written. [name] M.D., FACP, [facility]

## 2017-03-25 ENCOUNTER — Encounter: Payer: Self-pay | Admitting: Adult Health

## 2017-03-25 ENCOUNTER — Non-Acute Institutional Stay (SKILLED_NURSING_FACILITY): Payer: Medicare Other | Admitting: Adult Health

## 2017-03-25 DIAGNOSIS — F0390 Unspecified dementia without behavioral disturbance: Secondary | ICD-10-CM | POA: Diagnosis not present

## 2017-03-25 DIAGNOSIS — M1710 Unilateral primary osteoarthritis, unspecified knee: Secondary | ICD-10-CM

## 2017-03-25 DIAGNOSIS — D649 Anemia, unspecified: Secondary | ICD-10-CM

## 2017-03-25 DIAGNOSIS — E785 Hyperlipidemia, unspecified: Secondary | ICD-10-CM

## 2017-03-25 DIAGNOSIS — IMO0002 Reserved for concepts with insufficient information to code with codable children: Secondary | ICD-10-CM

## 2017-03-25 NOTE — Progress Notes (Signed)
DATE:  03/25/2017   MRN:  [id number]  BIRTHDAY: 09-07-1931  Facility:  Nursing Home Location:  [facility] Room Number: 305-A  LEVEL OF CARE:  SNF (31)  Contact Information    Name Relation Home Work Mobile   Mcgaughey,[name] Daughter [phone]         Code Status History    Date Active Date Inactive Code Status Order ID Comments User Context   02/12/2014  8:45 AM 02/15/2014  8:13 PM Full Code [id number]  Nolon Rod, DO Inpatient   02/12/2014  6:09 AM 02/12/2014  8:45 AM Full Code [id number]  Thad Ranger, MD ED   12/29/2013  5:30 PM 12/30/2013  9:00 PM Full Code [id number]  Ma Hillock, DO Inpatient   12/17/2013 12:55 PM 12/29/2013  5:30 PM Full Code [id number]  Hennie Duos, MD Outpatient   12/12/2013  5:54 PM 12/15/2013  8:16 PM Full Code [id number]  Nolon Rod, DO Inpatient   12/12/2013  5:25 PM 12/12/2013  5:54 PM Full Code [id number]  Nolon Rod, DO ED       Chief Complaint  Patient presents with  . Medical Management of Chronic Issues    Routine visit    HISTORY OF PRESENT ILLNESS:  This is an 74-YO male seen for a routine visit.  He is a long-term care resident at [facility].  He has a PMH of CVA, CKD, HLD, HTN, OA, prostate cancer, and dementia. He was seen in his room today. He verbalized no osteoarthritic pains.    PAST MEDICAL HISTORY:  Past Medical History:  Diagnosis Date  . Buford complex   . Cancer ([place])   . Chronic kidney disease   . Dementia   . Dysphagia   . Fall   . Hypertension   . Rhabdomyolysis   . Symbolic dysfunction      CURRENT MEDICATIONS: Reviewed  Patient's Medications  New Prescriptions   No medications on file  Previous Medications   ACETAMINOPHEN (TYLENOL) 325 MG TABLET    Take 650 mg by mouth every 4 (four) hours as needed for mild pain, moderate pain or fever. Not to exceed 3000 mg in 24 hour period.   ACETAMINOPHEN (TYLENOL) 650 MG CR TABLET    Take 650 mg by mouth 3  (three) times daily. Scheduled   MENTHOL, TOPICAL ANALGESIC, (BIOFREEZE) 4 % GEL    Apply topically. Every 8 hours as needed to aright knee and hip.   SIMVASTATIN (ZOCOR) 10 MG TABLET    Take 1 tablet (10 mg total) by mouth daily.   VITAMIN B-12 (CYANOCOBALAMIN) 1000 MCG TABLET    Take 1,000 mcg by mouth daily.  Modified Medications   No medications on file  Discontinued Medications   No medications on file     No Known Allergies   REVIEW OF SYSTEMS:  GENERAL: no change in appetite, no fatigue, no weight changes, no fever, chills or weakness MOUTH and THROAT: Denies oral discomfort, gingival pain or bleeding, pain from teeth or hoarseness   RESPIRATORY: no cough, SOB, DOE, wheezing, hemoptysis CARDIAC: no chest pain, edema or palpitations GI: no abdominal pain, diarrhea, constipation, heart burn, nausea or vomiting GU: Denies dysuria, frequency, hematuria, incontinence, or discharge PSYCHIATRIC: Denies feeling of depression or anxiety. No report of hallucinations, insomnia, paranoia, or agitation   PHYSICAL EXAMINATION  GENERAL APPEARANCE: Well nourished. In no acute distress.  SKIN:  Skin is warm and dry.  MOUTH and  THROAT: Lips are without lesions. Oral mucosa is moist and without lesions. Tongue is normal in shape, size, and color and without lesions RESPIRATORY: breathing is even & unlabored, BS CTAB CARDIAC: RRR, no murmur,no extra heart sounds, no edema GI: abdomen soft, normal BS, no masses, no tenderness EXTREMITIES:  Able to move X 4 extremities, BLE has generalized weakness PSYCHIATRIC: Alert and oriented X 3. Affect and behavior are appropriate   LABS/RADIOLOGY: Labs reviewed: Basic Metabolic Panel:  Recent Labs  06/04/16 06/07/16 12/05/16  NA 135* 134* 143  K 4.7 4.3 4.0  BUN 9 13 13   CREATININE 0.7 0.7 0.6   Liver Function Tests:  Recent Labs  06/04/16 08/07/16 12/05/16  AST 16 16 15   ALT 14 12 9*  ALKPHOS 73 69 50   CBC:  Recent Labs  06/04/16  06/07/16 12/05/16  WBC 6.7 8.2 6.3  NEUTROABS  --  5,248  --   HGB 13.1* 13.8 11.8*  HCT 39* 41 37*  PLT 228 272 272   Lipid Panel:  Recent Labs  08/07/16  HDL 27*    ASSESSMENT/PLAN:  1. Osteoarthrosis involving lower leg - stable, will decrease Tylenol 650 mg from TID to BID and Biofreeze gel topically   2. Hyperlipidemia, unspecified hyperlipidemia type - continue simvastatin 10 mg 1 daily at bedtime, check lipid panel and CMP   3. Anemia, unspecified type - check CBC Lab Results  Component Value Date   HGB 11.8 (A) 12/05/2016    4. Dementia without behavioral disturbance, unspecified dementia type - continue supportive care, and fall precautions     Goals of care:  Long-term care    Monina C. Ashby - NP    [employer] [phone]

## 2017-03-26 DIAGNOSIS — E561 Deficiency of vitamin K: Secondary | ICD-10-CM | POA: Diagnosis not present

## 2017-03-26 DIAGNOSIS — E785 Hyperlipidemia, unspecified: Secondary | ICD-10-CM | POA: Diagnosis not present

## 2017-03-26 DIAGNOSIS — I959 Hypotension, unspecified: Secondary | ICD-10-CM | POA: Diagnosis not present

## 2017-03-26 DIAGNOSIS — D649 Anemia, unspecified: Secondary | ICD-10-CM | POA: Diagnosis not present

## 2017-03-26 DIAGNOSIS — I129 Hypertensive chronic kidney disease with stage 1 through stage 4 chronic kidney disease, or unspecified chronic kidney disease: Secondary | ICD-10-CM | POA: Diagnosis not present

## 2017-03-26 LAB — CBC AND DIFFERENTIAL
HEMATOCRIT: 38 — AB (ref 41–53)
Hemoglobin: 12.3 — AB (ref 13.5–17.5)
Neutrophils Absolute: 4
Platelets: 185 (ref 150–399)
WBC: 6.7

## 2017-03-26 LAB — BASIC METABOLIC PANEL
BUN: 18 (ref 4–21)
Creatinine: 0.6 (ref 0.6–1.3)
GLUCOSE: 88
POTASSIUM: 4.2 (ref 3.4–5.3)
SODIUM: 141 (ref 137–147)

## 2017-03-26 LAB — LIPID PANEL
Cholesterol: 136 (ref 0–200)
HDL: 34 — AB (ref 35–70)
LDL Cholesterol: 90
LDl/HDL Ratio: 4
Triglycerides: 62 (ref 40–160)

## 2017-03-26 LAB — HEPATIC FUNCTION PANEL
ALK PHOS: 67 (ref 25–125)
ALT: 14 (ref 10–40)
AST: 17 (ref 14–40)
BILIRUBIN, TOTAL: 0.3

## 2017-03-28 DIAGNOSIS — D519 Vitamin B12 deficiency anemia, unspecified: Secondary | ICD-10-CM | POA: Diagnosis not present

## 2017-03-28 DIAGNOSIS — E538 Deficiency of other specified B group vitamins: Secondary | ICD-10-CM | POA: Diagnosis not present

## 2017-03-28 DIAGNOSIS — D649 Anemia, unspecified: Secondary | ICD-10-CM | POA: Diagnosis not present

## 2017-03-28 LAB — VITAMIN B12: Vitamin B-12: 652

## 2017-04-16 ENCOUNTER — Encounter: Payer: Self-pay | Admitting: Adult Health

## 2017-04-16 ENCOUNTER — Non-Acute Institutional Stay (SKILLED_NURSING_FACILITY): Payer: Medicare Other | Admitting: Adult Health

## 2017-04-16 DIAGNOSIS — E785 Hyperlipidemia, unspecified: Secondary | ICD-10-CM | POA: Diagnosis not present

## 2017-04-16 DIAGNOSIS — E538 Deficiency of other specified B group vitamins: Secondary | ICD-10-CM

## 2017-04-16 DIAGNOSIS — F0390 Unspecified dementia without behavioral disturbance: Secondary | ICD-10-CM | POA: Diagnosis not present

## 2017-04-16 DIAGNOSIS — IMO0002 Reserved for concepts with insufficient information to code with codable children: Secondary | ICD-10-CM

## 2017-04-16 DIAGNOSIS — M1710 Unilateral primary osteoarthritis, unspecified knee: Secondary | ICD-10-CM | POA: Diagnosis not present

## 2017-04-16 NOTE — Progress Notes (Signed)
DATE:  04/16/2017   MRN:  [id number]  BIRTHDAY: 29-Jul-1932  Facility:  Nursing Home Location:  [facility] Room Number: 305-A  LEVEL OF CARE:  SNF (31)  Contact Information    Name Relation Home Work Mobile   Cookson,[name] Daughter [phone]         Code Status History    Date Active Date Inactive Code Status Order ID Comments User Context   02/12/2014  8:45 AM 02/15/2014  8:13 PM Full Code [id number]  Nolon Rod, DO Inpatient   02/12/2014  6:09 AM 02/12/2014  8:45 AM Full Code [id number]  Thad Ranger, MD ED   12/29/2013  5:30 PM 12/30/2013  9:00 PM Full Code [id number]  Ma Hillock, DO Inpatient   12/17/2013 12:55 PM 12/29/2013  5:30 PM Full Code [id number]  Hennie Duos, MD Outpatient   12/12/2013  5:54 PM 12/15/2013  8:16 PM Full Code [id number]  Nolon Rod, DO Inpatient   12/12/2013  5:25 PM 12/12/2013  5:54 PM Full Code [id number]  Nolon Rod, DO ED       Chief Complaint  Patient presents with  . Medical Management of Chronic Issues    Routine visit    HISTORY OF PRESENT ILLNESS:  This is an 13-YO male seen for a routine visit.  He is a long-term care resident of [facility].  He has a PMH of CKD, CVA, HTN, HLD, OA, prostate cancer, and dementia. He was seen in the room today. He denies any concerns today. He was able to tell me the date today and place.    PAST MEDICAL HISTORY:  Past Medical History:  Diagnosis Date  . Buford complex   . Cancer ([place])   . Chronic kidney disease   . Dementia   . Dysphagia   . Fall   . Hypertension   . Rhabdomyolysis   . Symbolic dysfunction      CURRENT MEDICATIONS: Reviewed  Patient's Medications  New Prescriptions   No medications on file  Previous Medications   ACETAMINOPHEN (TYLENOL) 325 MG TABLET    Take 650 mg by mouth. Take 2 tablets BID scheduled and BID PRN   MENTHOL, TOPICAL ANALGESIC, (BIOFREEZE) 4 % GEL    Apply topically. Every 8 hours as needed to  aright knee and hip.   SIMVASTATIN (ZOCOR) 10 MG TABLET    Take 1 tablet (10 mg total) by mouth daily.   VITAMIN B-12 (CYANOCOBALAMIN) 1000 MCG TABLET    Take 1,000 mcg by mouth daily.  Modified Medications   No medications on file  Discontinued Medications   ACETAMINOPHEN (TYLENOL) 650 MG CR TABLET    Take 650 mg by mouth 3 (three) times daily. Scheduled     No Known Allergies   REVIEW OF SYSTEMS:  GENERAL: no change in appetite, no fatigue, no weight changes, no fever, chills or weakness MOUTH and THROAT: Denies oral discomfort RESPIRATORY: no cough, SOB, DOE, wheezing, hemoptysis CARDIAC: no chest pain, edema or palpitations GI: no abdominal pain, diarrhea, constipation, heart burn, nausea or vomiting GU: Denies dysuria, frequency, hematuria, incontinence, or discharge PSYCHIATRIC: Denies feeling of depression or anxiety. No report of hallucinations, insomnia, paranoia, or agitation    PHYSICAL EXAMINATION  GENERAL APPEARANCE:  In no acute distress.  SKIN:  Skin is warm and dry. MOUTH and THROAT: Lips are without lesions. Oral mucosa is moist and without lesions.  RESPIRATORY: breathing is even & unlabored, BS CTAB  CARDIAC: RRR, no murmur,no extra heart sounds, no edema GI: abdomen soft, normal BS, no masses, no tenderness, no hepatomegaly, no splenomegaly EXTREMITIES:  Able to move X 4 extremities, BLE has generalized weakness PSYCHIATRIC: Alert and oriented X 3. Affect and behavior are appropriate   LABS/RADIOLOGY: Labs reviewed: Basic Metabolic Panel:  Recent Labs  06/07/16 12/05/16 03/26/17  NA 134* 143 141  K 4.3 4.0 4.2  BUN 13 13 18   CREATININE 0.7 0.6 0.6   Liver Function Tests:  Recent Labs  08/07/16 12/05/16 03/26/17  AST 16 15 17   ALT 12 9* 14  ALKPHOS 69 50 67   CBC:  Recent Labs  06/07/16 12/05/16 03/26/17  WBC 8.2 6.3 6.7  NEUTROABS 5,248  --  4  HGB 13.8 11.8* 12.3*  HCT 41 37* 38*  PLT 272 272 185   Lipid Panel:  Recent Labs   08/07/16 03/26/17  HDL 27* 34*    ASSESSMENT/PLAN:  1. Vitamin B12 deficiency - Continue vitamin B12 1000 g daily Lab Results  Component Value Date   VITAMINB12 652 03/28/2017     2. Dementia without behavioral disturbance, unspecified dementia type - continue supportive care and fall precautions   3. Osteoarthrosis involving lower leg - no complaints of pain today, continue Tylenol 325 mg daily 2 tabs = 650 mg twice a day   4. Hyperlipidemia, unspecified hyperlipidemia type - continue simvastatin 10 mg 1 tab daily Lab Results  Component Value Date   CHOL 136 03/26/2017   HDL 34 (A) 03/26/2017   LDLCALC 90 03/26/2017   TRIG 62 03/26/2017   CHOLHDL 5.3 09/10/2013       Goals of care:  Long-term care     Trai Ells C. Stayton - NP    [employer] [phone]

## 2017-05-09 ENCOUNTER — Non-Acute Institutional Stay (SKILLED_NURSING_FACILITY): Payer: Medicare Other | Admitting: Adult Health

## 2017-05-09 ENCOUNTER — Encounter: Payer: Self-pay | Admitting: Adult Health

## 2017-05-09 DIAGNOSIS — M1710 Unilateral primary osteoarthritis, unspecified knee: Secondary | ICD-10-CM

## 2017-05-09 DIAGNOSIS — E785 Hyperlipidemia, unspecified: Secondary | ICD-10-CM

## 2017-05-09 DIAGNOSIS — E538 Deficiency of other specified B group vitamins: Secondary | ICD-10-CM | POA: Diagnosis not present

## 2017-05-09 DIAGNOSIS — F0390 Unspecified dementia without behavioral disturbance: Secondary | ICD-10-CM

## 2017-05-09 DIAGNOSIS — IMO0002 Reserved for concepts with insufficient information to code with codable children: Secondary | ICD-10-CM

## 2017-05-09 NOTE — Progress Notes (Signed)
DATE:  05/09/2017   MRN:  [id number]  BIRTHDAY: 05-Jun-1932  Facility:  Nursing Home Location:  [facility] Room Number: 305-A  LEVEL OF CARE:  SNF (31)  Contact Information    Name Relation Home Work Mobile   Lackie,[name] Daughter [phone]         Code Status History    Date Active Date Inactive Code Status Order ID Comments User Context   02/12/2014  8:45 AM 02/15/2014  8:13 PM Full Code [id number]  Nolon Rod, DO Inpatient   02/12/2014  6:09 AM 02/12/2014  8:45 AM Full Code [id number]  Thad Ranger, MD ED   12/29/2013  5:30 PM 12/30/2013  9:00 PM Full Code [id number]  Ma Hillock, DO Inpatient   12/17/2013 12:55 PM 12/29/2013  5:30 PM Full Code [id number]  Hennie Duos, MD Outpatient   12/12/2013  5:54 PM 12/15/2013  8:16 PM Full Code [id number]  Nolon Rod, DO Inpatient   12/12/2013  5:25 PM 12/12/2013  5:54 PM Full Code [id number]  Nolon Rod, DO ED       Chief Complaint  Patient presents with  . Medical Management of Chronic Issues    Routine visit    HISTORY OF PRESENT ILLNESS:  This is an 82-YO male seen for a routine visit.  He is a long-term care resident of [facility].  He has a PMH of CKD, CVA, HTN, HLD, OA, prostate cancer, and dementia. He was seen in his room today. Latest Vitamin B 12 level is 652, normal- improved from ,150 ON 12/10/16.        PAST MEDICAL HISTORY:  Past Medical History:  Diagnosis Date  . Buford complex   . Cancer ([place])   . Chronic kidney disease   . Dementia   . Dysphagia   . Fall   . Hypertension   . Rhabdomyolysis   . Symbolic dysfunction      CURRENT MEDICATIONS: Reviewed  Patient's Medications  New Prescriptions   No medications on file  Previous Medications   ACETAMINOPHEN (TYLENOL) 325 MG TABLET    Take 650 mg by mouth. Take 2 tablets BID scheduled and BID PRN   MENTHOL, TOPICAL ANALGESIC, (BIOFREEZE) 4 % GEL    Apply topically. Every 8 hours as needed to  aright knee and hip.   SIMVASTATIN (ZOCOR) 10 MG TABLET    Take 1 tablet (10 mg total) by mouth daily.   VITAMIN B-12 (CYANOCOBALAMIN) 1000 MCG TABLET    Take 1,000 mcg by mouth daily.  Modified Medications   No medications on file  Discontinued Medications   No medications on file     No Known Allergies   REVIEW OF SYSTEMS:  GENERAL: no change in appetite, no fatigue, no weight changes, no fever, chills or weakness MOUTH and THROAT: Denies oral discomfort, gingival pain or bleeding RESPIRATORY: no cough, SOB, DOE, wheezing, hemoptysis CARDIAC: no chest pain, edema or palpitations GI: no abdominal pain, diarrhea, constipation, heart burn, nausea or vomiting GU: Denies dysuria, frequency, hematuria, incontinence, or discharge PSYCHIATRIC: Denies feeling of depression or anxiety. No report of hallucinations, insomnia, paranoia, or agitation    PHYSICAL EXAMINATION  GENERAL APPEARANCE:  In no acute distress.  SKIN:  Skin is warm and dry.  MOUTH and THROAT: Lips are without lesions. Oral mucosa is moist and without lesions.  RESPIRATORY: breathing is even & unlabored, BS CTAB CARDIAC: RRR, no murmur,no extra heart sounds, no edema  GI: abdomen soft, normal BS, no masses, no tenderness, no hepatomegaly, no splenomegaly EXTREMITIES: Able to move X 4 extremities, BLE generalized weakness PSYCHIATRIC: Alert and oriented X 3. Affect and behavior are appropriate   LABS/RADIOLOGY: Labs reviewed: Basic Metabolic Panel:  Recent Labs  06/07/16 12/05/16 03/26/17  NA 134* 143 141  K 4.3 4.0 4.2  BUN 13 13 18   CREATININE 0.7 0.6 0.6   Liver Function Tests:  Recent Labs  08/07/16 12/05/16 03/26/17  AST 16 15 17   ALT 12 9* 14  ALKPHOS 69 50 67   CBC:  Recent Labs  06/07/16 12/05/16 03/26/17  WBC 8.2 6.3 6.7  NEUTROABS 5,248  --  4  HGB 13.8 11.8* 12.3*  HCT 41 37* 38*  PLT 272 272 185   Lipid Panel:  Recent Labs  08/07/16 03/26/17  HDL 27* 34*     ASSESSMENT/PLAN:  1. Hyperlipidemia, unspecified hyperlipidemia type - continue Simvastatin 10 mg 1 tab daily Lab Results  Component Value Date   CHOL 136 03/26/2017   HDL 34 (A) 03/26/2017   LDLCALC 90 03/26/2017   TRIG 62 03/26/2017   CHOLHDL 5.3 09/10/2013    2. Dementia without behavioral disturbance, unspecified dementia type - continue supportive care, fall precautions   3. Vitamin B12 deficiency - vitamin B 12 level 652, Continue vitamin B12 1000 g 1 tab daily   4. Osteoarthrosis involving lower leg - he verbalized having no pain during the visit, continue acetaminophen 325 mg give 2 tabs = 650 mg by mouth twice a day     Goals of care:  Long-term care   Rawan Riendeau C. Sebastian - NP    [employer] [phone]

## 2017-06-24 ENCOUNTER — Non-Acute Institutional Stay (SKILLED_NURSING_FACILITY): Payer: Medicare Other | Admitting: Internal Medicine

## 2017-06-24 ENCOUNTER — Encounter: Payer: Self-pay | Admitting: Internal Medicine

## 2017-06-24 DIAGNOSIS — M1710 Unilateral primary osteoarthritis, unspecified knee: Secondary | ICD-10-CM

## 2017-06-24 DIAGNOSIS — E538 Deficiency of other specified B group vitamins: Secondary | ICD-10-CM | POA: Diagnosis not present

## 2017-06-24 DIAGNOSIS — I10 Essential (primary) hypertension: Secondary | ICD-10-CM | POA: Diagnosis not present

## 2017-06-24 DIAGNOSIS — IMO0002 Reserved for concepts with insufficient information to code with codable children: Secondary | ICD-10-CM

## 2017-06-24 DIAGNOSIS — R489 Unspecified symbolic dysfunctions: Secondary | ICD-10-CM | POA: Diagnosis not present

## 2017-06-24 NOTE — Assessment & Plan Note (Signed)
Apparent adequate control on present dose of acetaminophen. I would not increase the dose because of potential hepatotoxicity.

## 2017-06-24 NOTE — Assessment & Plan Note (Signed)
BP controlled; no antihypertensive medication indicated 

## 2017-06-24 NOTE — Progress Notes (Signed)
NURSING HOME LOCATION:  [facility] ROOM NUMBER:  305-A  CODE STATUS:  Full Code  PCP:  [name], MD  [place] [address]   This is a nursing facility follow up of chronic medical diagnoses  Interim medical record and care since last [place] visit was updated with review of diagnostic studies and change in clinical status since last visit were documented.  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 At this dose there is  little potential for hepatotoxicity. He remains on low-dose statin because of history of stroke. He is on oral B12 supplement with documented adequate replacement . B12 level was therapeutic at 652 on 03/28/17. His anemia had improved serially. On 8/29 hemoglobin was 12.3, up from 11.8. Hepatorenal function was normal.  No family history: no record  Review of systems: He has a diagnosis of dementia but he was able to give me the correct date. His meal tray was uneaten in front of him. He states he is not hungry. He denies dysphagia, dyspepsia, other GI symptoms.  His only complaint is pain in his legs    Constitutional: No fever,significant weight change, fatigue  Eyes: No redness, discharge, pain, vision change ENT/mouth: No nasal congestion,  purulent discharge, earache,change in hearing ,sore throat    Cardiovascular: No chest pain, palpitations,paroxysmal nocturnal dyspnea, claudication, edema  Respiratory: No cough, sputum production,hemoptysis, DOE , significant snoring,apnea  Gastrointestinal: No heartburn,dysphagia,abdominal pain, nausea / vomiting,rectal bleeding, melena,change in bowels Genitourinary: No dysuria,hematuria, pyuria,  incontinence, nocturia Dermatologic: No rash, pruritus, change in appearance of skin Neurologic: No dizziness,headache,syncope, seizures, numbness , tingling Psychiatric: No significant anxiety , depression, insomnia, anorexia Endocrine: No change in hair/skin/ nails, excessive thirst, excessive hunger, excessive urination  Hematologic/lymphatic: No significant bruising, lymphadenopathy,abnormal bleeding Allergy/immunology: No itchy/ watery eyes, significant sneezing, urticaria, angioedema  Physical exam:  Pertinent or positive findings: He appears chronically malnourished with temporal wasting. He also has wasting of the interosseous tissues. Pattern alopecia is present. Pterygium is present greater on the left than right. He has arcus senilis. Heart sounds  distant. The rhythm could not be determined. Breath sounds are decreased. He has flexion contractures of the fourth and fifth right fingers. Fusiform changes of the knees are present. He has a varus deformity of the lower extremities. When asked to lift his legs against opposition he only flexes his feet. Strength in upper extremities is decreased. Pedal pulses are decreased  General appearance: no acute distress , increased work of breathing is present.   Lymphatic: No lymphadenopathy about the head, neck, axilla . Eyes: No conjunctival inflammation or lid edema is present. There is no scleral icterus. Ears:  External ear exam shows no significant lesions or deformities.   Nose:  External nasal examination shows no deformity or inflammation. Nasal mucosa are pink and moist without lesions ,exudates Oral  exam: lips and gums are healthy appearing.There is no oropharyngeal erythema  or exudate . Neck:  No thyromegaly, masses, tenderness noted.    Heart:  No definite gallop, murmur, click, rub .  Lungs: without wheezes, rhonchi,rales , rubs. Abdomen:Bowel sounds are normal. Abdomen is soft and nontender with no organomegaly, hernias,masses. GU: deferred  Extremities:  No cyanosis, clubbing,edema  Neurologic exam : Balance,Rhomberg,finger to nose testing could not be completed due to clinical state Skin: Warm & dry w/o tenting. No significant lesions or rash.  See summary under each active problem in the Problem List with associated updated therapeutic plan

## 2017-06-24 NOTE — Assessment & Plan Note (Signed)
03/28/17 B12 level therapeutic at 652, no change indicated

## 2017-06-25 NOTE — Assessment & Plan Note (Addendum)
Clinically stable Psych F/U for any acute issues

## 2017-06-25 NOTE — Patient Instructions (Signed)
See assessment and plan under each diagnosis in the problem list and acutely for this visit 

## 2017-07-24 ENCOUNTER — Non-Acute Institutional Stay (SKILLED_NURSING_FACILITY): Payer: Medicare Other | Admitting: Adult Health

## 2017-07-24 ENCOUNTER — Encounter: Payer: Self-pay | Admitting: Adult Health

## 2017-07-24 DIAGNOSIS — M1710 Unilateral primary osteoarthritis, unspecified knee: Secondary | ICD-10-CM

## 2017-07-24 DIAGNOSIS — IMO0002 Reserved for concepts with insufficient information to code with codable children: Secondary | ICD-10-CM

## 2017-07-24 DIAGNOSIS — E785 Hyperlipidemia, unspecified: Secondary | ICD-10-CM

## 2017-07-24 DIAGNOSIS — E538 Deficiency of other specified B group vitamins: Secondary | ICD-10-CM | POA: Diagnosis not present

## 2017-07-24 NOTE — Progress Notes (Signed)
Location:  [place] Room Number: 305-A Place of Service:  SNF (31) Provider:  Durenda Age, NP  Patient Care Team: [name], MD as PCP - General (Internal Medicine) Rehab, [place] ([place])  Extended Emergency Contact Information Primary Emergency Contact: Sturgell,[name] Address: [place], [place] [address] [name] of [place] Phone: [phone] Relation: Daughter  Code Status:  Full Code  Goals of care: Advanced Directive information Advanced Directives 02/20/2017  Does Patient Have a Medical Advance Directive? Yes  Type of Advance Directive Out of facility DNR (pink MOST or yellow form)  Does patient want to make changes to medical advance directive? No - Patient declined  Would patient like information on creating a medical advance directive? -  Pre-existing out of facility DNR order (yellow form or pink MOST form) Pink MOST form placed in chart (order not valid for inpatient use);Yellow form placed in chart (order not valid for inpatient use)     Chief Complaint  Patient presents with  . Medical Management of Chronic Issues    Routine [facility] visit    HPI:  Pt is an 83 y.o. male seen today for medical management of chronic diseases.  He is a long-term care resident of [facility].  He has a PMH of CKD, CVA, hypertension, hyperlipidemia, osteoarthritis, prostate cancer, and dementia.     Past Medical History:  Diagnosis Date  . 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 Cancer ([place])   . Chronic kidney disease   . Dementia   . Dysphagia   . Fall   . Hypertension   . Rhabdomyolysis   . Symbolic dysfunction    Social impairment   History reviewed. No pertinent surgical history.  No Known Allergies  Outpatient Encounter Medications as of 07/24/2017  Medication Sig  . acetaminophen (TYLENOL) 325 MG tablet Take 650  mg by mouth. Take 2 tablets BID scheduled and BID PRN  . simvastatin (ZOCOR) 10 MG tablet Take 1 tablet (10 mg total) by mouth daily.  . vitamin B-12 (CYANOCOBALAMIN) 1000 MCG tablet Take 1,000 mcg by mouth daily.  . [DISCONTINUED] Menthol, Topical Analgesic, (BIOFREEZE) 4 % GEL Apply topically. Every 8 hours as needed to aright knee and hip.   No facility-administered encounter medications on file as of 07/24/2017.     Review of Systems  GENERAL: No change in appetite, no fatigue, no weight changes, no fever, chills or weakness MOUTH and THROAT: Denies oral discomfort, gingival pain or bleeding, pain from teeth or hoarseness   RESPIRATORY: no cough, SOB, DOE, wheezing, hemoptysis CARDIAC: No chest pain, edema or palpitations GI: No abdominal pain, diarrhea, constipation, heart burn, nausea or vomiting GU: Denies dysuria, frequency, hematuria PSYCHIATRIC: Denies feelings of depression or anxiety. No report of hallucinations, insomnia, paranoia, or agitation   Immunization History  Administered Date(s) Administered  . Influenza Split 05/08/2011, 05/07/2012  . Influenza Whole 04/22/2008, 05/25/2009  . Influenza-Unspecified 05/10/2015, 05/02/2016, 05/15/2017  . PPD Test 12/15/2013, 01/16/2015, 02/15/2015  . Pneumococcal Polysaccharide-23 04/10/2010, 12/15/2013  . Td 04/10/2010   Pertinent  Health Maintenance Due  Topic Date Due  . PNA vac Low Risk Adult (2 of 2 - PCV13) 10/13/2043 (Originally 12/16/2014)  . INFLUENZA VACCINE  Completed   Fall Risk  02/20/2017 08/30/2016 04/12/2016 03/22/2016 05/07/2012  Falls in the past year? No No No No -  Risk for fall  due to : - - - - Impaired balance/gait;Impaired mobility      Vitals:   07/24/17 0835  BP: 110/62  Pulse: 63  Resp: 20  Temp: 97.8 F (36.6 C)  TempSrc: Oral  SpO2: 98%  Weight: 135 lb 3.2 oz (61.3 kg)  Height: 5\' 11"  (1.803 m)   Body mass index is 18.86 kg/m.  Physical Exam  GENERAL APPEARANCE:  In no acute distress.    SKIN:  Skin is warm and dry.  MOUTH and THROAT: Lips are without lesions. Oral mucosa is moist and without lesions.  RESPIRATORY: Breathing is even & unlabored, BS CTAB CARDIAC: RRR, no murmur,no extra heart sounds, no edema GI: Abdomen soft, normal BS, no masses, no tenderness, no hepatomegaly, no splenomegaly EXTREMITIES:  Able to move X 4 extremities, BLE with generalized weakness PSYCHIATRIC: Alert and oriented X 3. Affect and behavior are appropriate   Labs reviewed: Recent Labs    12/05/16 03/26/17  NA 143 141  K 4.0 4.2  BUN 13 18  CREATININE 0.6 0.6   Recent Labs    08/07/16 12/05/16 03/26/17  AST 16 15 17   ALT 12 9* 14  ALKPHOS 69 50 67   Recent Labs    12/05/16 03/26/17  WBC 6.3 6.7  NEUTROABS  --  4  HGB 11.8* 12.3*  HCT 37* 38*  PLT 272 185   Lab Results  Component Value Date   TSH 4.580 (H) 12/12/2013    Lab Results  Component Value Date   CHOL 136 03/26/2017   HDL 34 (A) 03/26/2017   LDLCALC 90 03/26/2017   TRIG 62 03/26/2017   CHOLHDL 5.3 09/10/2013    Assessment/Plan  1. Hyperlipidemia, unspecified hyperlipidemia type - continue simvastatin 10 mg 1 tab daily Lab Results  Component Value Date   CHOL 136 03/26/2017   HDL 34 (A) 03/26/2017   LDLCALC 90 03/26/2017   TRIG 62 03/26/2017   CHOLHDL 5.3 09/10/2013    2. Vitamin B12 deficiency - continue vitamin B12 1000 g 1 tab daily   3. Osteoarthrosis involving lower leg - continue acetaminophen 325 mg give 2 tabs  =650 mg twice a day when necessary and BID routine and Biofreeze to right knee and right hip Q shift     Family/ staff Communication: Discussed plan of care with resident and charge nurse.  Labs/tests ordered:  None  Goals of care:   Long-term care    Durenda Age, NP [facility] [phone] (Monday-Friday 8:00 a.m. - 5:00 p.m.) [phone] (after hours)

## 2017-07-31 ENCOUNTER — Encounter: Payer: Self-pay | Admitting: Internal Medicine

## 2017-07-31 NOTE — Progress Notes (Signed)
     07/31/2017  Clerk of [employer], [place]  To Whom It May Concern:  I am writing on behalf of Jamario Colina, DOB 1932/05/31.  He is 84 years old and a permanent resident of [facility].  I am requesting he be excused from jury duty scheduled 08/06/17.    Sincerely,     Darrick Penna. Vivien Rossetti, MD

## 2017-08-14 ENCOUNTER — Encounter: Payer: Self-pay | Admitting: Adult Health

## 2017-08-14 ENCOUNTER — Non-Acute Institutional Stay (SKILLED_NURSING_FACILITY): Payer: Medicare Other | Admitting: Adult Health

## 2017-08-14 DIAGNOSIS — F0390 Unspecified dementia without behavioral disturbance: Secondary | ICD-10-CM

## 2017-08-14 DIAGNOSIS — E785 Hyperlipidemia, unspecified: Secondary | ICD-10-CM | POA: Diagnosis not present

## 2017-08-14 DIAGNOSIS — M1710 Unilateral primary osteoarthritis, unspecified knee: Secondary | ICD-10-CM | POA: Diagnosis not present

## 2017-08-14 DIAGNOSIS — E538 Deficiency of other specified B group vitamins: Secondary | ICD-10-CM

## 2017-08-14 DIAGNOSIS — L853 Xerosis cutis: Secondary | ICD-10-CM | POA: Diagnosis not present

## 2017-08-14 DIAGNOSIS — IMO0002 Reserved for concepts with insufficient information to code with codable children: Secondary | ICD-10-CM

## 2017-08-14 NOTE — Progress Notes (Signed)
Location:  [place] Room Number: 305-A Place of Service:  SNF (31) Provider:  Durenda Age, NP  Patient Care Team: [name], MD as PCP - General (Internal Medicine) Rehab, [place] ([place])  Extended Emergency Contact Information Primary Emergency Contact: Reicks,[name] Address: [place], [place] [address] [name] of [place] Phone: [phone] Relation: Daughter  Code Status:  Full Code  Goals of care: Advanced Directive information Advanced Directives 02/20/2017  Does Patient Have a Medical Advance Directive? Yes  Type of Advance Directive Out of facility DNR (pink MOST or yellow form)  Does patient want to make changes to medical advance directive? No - Patient declined  Would patient like information on creating a medical advance directive? -  Pre-existing out of facility DNR order (yellow form or pink MOST form) Pink MOST form placed in chart (order not valid for inpatient use);Yellow form placed in chart (order not valid for inpatient use)    Chief Complaint  Patient presents with  . Medical Management of Chronic Issues    Routine [facility] visit    HPI:  Pt is an 84 y.o. male seen today for medical management of chronic diseases.  He is a long-term care resident of [facility].  He has a PMH of CKD, CVA, hypertension, hyperlipidemia, OA, prostate cancer, and dementia. He was seen in his room. He had moved to the bed closer to the window. Noted bilateral feet to be dry and has flakes.     Past Medical History:  Diagnosis Date  . 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 Cancer ([place])   . Chronic kidney disease   . Dementia   . Dysphagia   . Fall   . Hypertension   . Rhabdomyolysis   . Symbolic dysfunction    Social impairment   History reviewed. No pertinent surgical history.  No Known Allergies  Outpatient  Encounter Medications as of 08/14/2017  Medication Sig  . acetaminophen (TYLENOL) 325 MG tablet Take 650 mg by mouth. Take 2 tablets BID scheduled and BID PRN  . Menthol, Topical Analgesic, (BIOFREEZE EX) Apply 1 application topically 3 (three) times daily. Apply to right knee and right hip each shift for pain  . simvastatin (ZOCOR) 10 MG tablet Take 1 tablet (10 mg total) by mouth daily.  . vitamin B-12 (CYANOCOBALAMIN) 1000 MCG tablet Take 1,000 mcg by mouth daily.   No facility-administered encounter medications on file as of 08/14/2017.     Review of Systems  GENERAL: No change in appetite, no fatigue, no weight changes, no fever, chills or weakness MOUTH and THROAT: Denies oral discomfort, gingival pain or bleeding RESPIRATORY: no cough, SOB, DOE, wheezing, hemoptysis CARDIAC: No chest pain, edema or palpitations GI: No abdominal pain, diarrhea, constipation, heart burn, nausea or vomiting GU: Denies dysuria, frequency, hematuria, incontinence, or discharge PSYCHIATRIC: Denies feelings of depression or anxiety. No report of hallucinations, insomnia, paranoia, or agitation   Immunization History  Administered Date(s) Administered  . Influenza Split 05/08/2011, 05/07/2012  . Influenza Whole 04/22/2008, 05/25/2009  . Influenza-Unspecified 05/10/2015, 05/02/2016, 05/15/2017  . PPD Test 12/15/2013, 01/16/2015, 02/15/2015  . Pneumococcal Polysaccharide-23 04/10/2010, 12/15/2013  . Td 04/10/2010   Pertinent  Health Maintenance Due  Topic Date Due  . PNA vac Low Risk Adult (2 of 2 - PCV13) 10/13/2043 (Originally 12/16/2014)  . INFLUENZA VACCINE  Completed  Fall Risk  02/20/2017 08/30/2016 04/12/2016 03/22/2016 05/07/2012  Falls in the past year? No No No No -  Risk for fall due to : - - - - Impaired balance/gait;Impaired mobility      Vitals:   08/14/17 1105  BP: 114/66  Pulse: 98  Resp: 18  Temp: 97.6 F (36.4 C)  TempSrc: Oral  SpO2: 96%  Weight: 137 lb 3.2 oz (62.2 kg)    Height: 5\' 11"  (1.803 m)   Body mass index is 19.14 kg/m.  Physical Exam  GENERAL APPEARANCE:  In no acute distress.  SKIN:  Dry and flaky skin on bilateral feet MOUTH and THROAT: Lips are without lesions. Oral mucosa is moist and without lesions.  RESPIRATORY: Breathing is even & unlabored, BS CTAB CARDIAC: RRR, no murmur,no extra heart sounds, no edema GI: Abdomen soft, normal BS, no masses, no tenderness EXTREMITIES:  BLE has generalized weakness, able to move BUE PSYCHIATRIC: Alert and oriented X 3. Affect and behavior are appropriate   Labs reviewed: Recent Labs    12/05/16 03/26/17  NA 143 141  K 4.0 4.2  BUN 13 18  CREATININE 0.6 0.6   Recent Labs    12/05/16 03/26/17  AST 15 17  ALT 9* 14  ALKPHOS 50 67   Recent Labs    12/05/16 03/26/17  WBC 6.3 6.7  NEUTROABS  --  4  HGB 11.8* 12.3*  HCT 37* 38*  PLT 272 185   Lab Results  Component Value Date   TSH 4.580 (H) 12/12/2013    Lab Results  Component Value Date   CHOL 136 03/26/2017   HDL 34 (A) 03/26/2017   LDLCALC 90 03/26/2017   TRIG 62 03/26/2017   CHOLHDL 5.3 09/10/2013    Assessment/Plan  1. Osteoarthrosis involving lower leg - continue acetaminophen 325 mg give 2 tabs twice a day and twice a day when necessary and Biofreeze to right knee and right hip every shift  2. Hyperlipidemia, unspecified hyperlipidemia type - continue simvastatin 20 mg 1 tab daily Lab Results  Component Value Date   CHOL 136 03/26/2017   HDL 34 (A) 03/26/2017   LDLCALC 90 03/26/2017   TRIG 62 03/26/2017   CHOLHDL 5.3 09/10/2013     3. Vitamin B12 deficiency - continue vitamin B12 1000 g 1 tab daily   4. Dementia without behavioral disturbance, unspecified dementia type - continue supportive care, fall precautions   5. Dry skin - will apply warm so Tylenol for bilateral feet 20 minutes then cleanse with wet towel well. Apply Eucerin to bilateral feet daily 2 weeks     Family/ staff Communication:  Discussed plan of care with resident.  Labs/tests ordered:  CBC and CMP  Goals of care:   Long-term care   Durenda Age, NP [facility] and Adult Medicine [phone] (Monday-Friday 8:00 a.m. - 5:00 p.m.) [phone] (after hours)

## 2017-08-15 DIAGNOSIS — I10 Essential (primary) hypertension: Secondary | ICD-10-CM | POA: Diagnosis not present

## 2017-08-15 DIAGNOSIS — D649 Anemia, unspecified: Secondary | ICD-10-CM | POA: Diagnosis not present

## 2017-08-15 LAB — CBC AND DIFFERENTIAL
HCT: 41 (ref 41–53)
Hemoglobin: 13.3 — AB (ref 13.5–17.5)
NEUTROS ABS: 4
Platelets: 200 (ref 150–399)
WBC: 7.2

## 2017-08-15 LAB — HEPATIC FUNCTION PANEL
ALT: 8 — AB (ref 10–40)
AST: 15 (ref 14–40)
Alkaline Phosphatase: 60 (ref 25–125)
BILIRUBIN, TOTAL: 0.3

## 2017-08-15 LAB — BASIC METABOLIC PANEL
BUN: 15 (ref 4–21)
Creatinine: 0.5 — AB (ref 0.6–1.3)
GLUCOSE: 88
Potassium: 4.4 (ref 3.4–5.3)
SODIUM: 140 (ref 137–147)

## 2017-09-19 DIAGNOSIS — I739 Peripheral vascular disease, unspecified: Secondary | ICD-10-CM | POA: Diagnosis not present

## 2017-09-19 DIAGNOSIS — B351 Tinea unguium: Secondary | ICD-10-CM | POA: Diagnosis not present

## 2017-09-19 LAB — HM DIABETES FOOT EXAM

## 2017-09-26 ENCOUNTER — Non-Acute Institutional Stay (SKILLED_NURSING_FACILITY): Payer: Medicare Other | Admitting: Adult Health

## 2017-09-26 ENCOUNTER — Encounter: Payer: Self-pay | Admitting: Adult Health

## 2017-09-26 DIAGNOSIS — E785 Hyperlipidemia, unspecified: Secondary | ICD-10-CM

## 2017-09-26 DIAGNOSIS — F0390 Unspecified dementia without behavioral disturbance: Secondary | ICD-10-CM | POA: Diagnosis not present

## 2017-09-26 DIAGNOSIS — E538 Deficiency of other specified B group vitamins: Secondary | ICD-10-CM

## 2017-09-26 DIAGNOSIS — M159 Polyosteoarthritis, unspecified: Secondary | ICD-10-CM | POA: Diagnosis not present

## 2017-09-26 NOTE — Progress Notes (Signed)
Location:  [facility] Room Number: 305-A Place of Service:  SNF (31) Provider:  Durenda Age, NP  Patient Care Team: [name], MD as PCP - General (Internal Medicine) Rehab, [place] ([place])  Extended Emergency Contact Information Primary Emergency Contact: Bueche,[name] Address: [place], [place] [address] [name] of [place] Phone: [phone] Relation: Daughter  Code Status:  Full Code  Goals of care: Advanced Directive information Advanced Directives 02/20/2017  Does Patient Have a Medical Advance Directive? Yes  Type of Advance Directive Out of facility DNR (pink MOST or yellow form)  Does patient want to make changes to medical advance directive? No - Patient declined  Would patient like information on creating a medical advance directive? -  Pre-existing out of facility DNR order (yellow form or pink MOST form) Pink MOST form placed in chart (order not valid for inpatient use);Yellow form placed in chart (order not valid for inpatient use)     Chief Complaint  Patient presents with  . Medical Management of Chronic Issues    Routine [facility] visit    HPI:  Pt is an 84 y.o. male seen today for medical management of chronic diseases. He is a long-term care resident of [facility].  He has a PMH of CKD, CVA, hypertension, hyperlipidemia, OA, prostate cancer, and dementia. He was seen in the room today. He told me that it is his 86th birthday next Saturday, [birth year]. He did not complain of any pain. He is in happy mood. 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    Past Medical History:  Diagnosis Date  . 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 Cancer ([place])   . Chronic kidney disease   . Dementia   . Dysphagia   . Fall   . Hypertension   . Rhabdomyolysis   . Symbolic dysfunction    Social impairment   History reviewed. No pertinent surgical history.  No Known Allergies  Outpatient Encounter Medications as of 09/26/2017  Medication Sig  . acetaminophen (TYLENOL) 325 MG tablet Take 650 mg by mouth. Take 2 tablets BID scheduled and BID PRN  . Menthol, Topical Analgesic, (BIOFREEZE EX) Apply 1 application topically 3 (three) times daily. Apply to right knee and right hip each shift for pain  . simvastatin (ZOCOR) 10 MG tablet Take 1 tablet (10 mg total) by mouth daily.  . vitamin B-12 (CYANOCOBALAMIN) 1000 MCG tablet Take 1,000 mcg by mouth daily.   No facility-administered encounter medications on file as of 09/26/2017.     Review of Systems  GENERAL: No change in appetite, no fatigue, no weight changes, no fever, chills or weakness MOUTH and THROAT: Denies oral discomfort, gingival pain or bleeding, pain from teeth or hoarseness   RESPIRATORY: no cough, SOB, DOE, wheezing, hemoptysis CARDIAC: No chest pain, edema or palpitations GI: No abdominal pain, diarrhea, constipation, heart burn, nausea or vomiting PSYCHIATRIC: Denies feelings of depression or anxiety. No report of hallucinations, insomnia, paranoia, or agitation    Immunization History  Administered Date(s) Administered  . Influenza Split 05/08/2011, 05/07/2012  . Influenza Whole 04/22/2008, 05/25/2009  . Influenza-Unspecified 05/10/2015, 05/02/2016, 05/15/2017  . PPD Test 12/15/2013, 01/16/2015, 02/15/2015  . Pneumococcal Polysaccharide-23 04/10/2010, 12/15/2013  . Td 04/10/2010   Pertinent  Health Maintenance Due  Topic Date Due  . PNA vac  Low Risk Adult (2 of 2 - PCV13) 10/13/2043 (Originally 12/16/2014)  . INFLUENZA VACCINE  Completed   Fall Risk  02/20/2017 08/30/2016 04/12/2016 03/22/2016 05/07/2012  Falls in the past year? No No No No -  Risk for fall due to : - - - - Impaired balance/gait;Impaired mobility      Vitals:   09/26/17 1400  BP: 122/68  Pulse: 70  Resp: 17  Temp: (!) 97.1 F  (36.2 C)  TempSrc: Oral  SpO2: 97%  Weight: 137 lb 6.4 oz (62.3 kg)  Height: 5\' 11"  (1.803 m)   Body mass index is 19.16 kg/m.  Physical Exam  GENERAL APPEARANCE: Well nourished. In no acute distress. Normal body habitus SKIN:  Skin is warm and dry.  MOUTH and THROAT: Lips are without lesions. Oral mucosa is moist and without lesions. Tongue is normal in shape, size, and color and without lesions RESPIRATORY: Breathing is even & unlabored, BS CTAB CARDIAC: RRR, no murmur,no extra heart sounds, no edema GI: Abdomen soft, normal BS, no masses, no tenderness EXTREMITIES:  Able to move X 4 extremities, BLE with generalized weakness PSYCHIATRIC: Alert and oriented X 3. Affect and behavior are appropriate   Labs reviewed: Recent Labs    12/05/16 03/26/17 08/15/17  NA 143 141 140  K 4.0 4.2 4.4  BUN 13 18 15   CREATININE 0.6 0.6 0.5*   Recent Labs    12/05/16 03/26/17 08/15/17  AST 15 17 15   ALT 9* 14 8*  ALKPHOS 50 67 60   Recent Labs    12/05/16 03/26/17 08/15/17  WBC 6.3 6.7 7.2  NEUTROABS  --  4 4  HGB 11.8* 12.3* 13.3*  HCT 37* 38* 41  PLT 272 185 200   Lab Results  Component Value Date   TSH 4.580 (H) 12/12/2013    Lab Results  Component Value Date   CHOL 136 03/26/2017   HDL 34 (A) 03/26/2017   LDLCALC 90 03/26/2017   TRIG 62 03/26/2017   CHOLHDL 5.3 09/10/2013    Assessment/Plan  1. Vitamin B12 deficiency - continue Vitamin B12 1,000 mcg 1 tab daily   2. Hyperlipidemia, unspecified hyperlipidemia type - continue Simvastatin 10 mg 1 tab daily Lab Results  Component Value Date   CHOL 136 03/26/2017   HDL 34 (A) 03/26/2017   LDLCALC 90 03/26/2017   TRIG 62 03/26/2017   CHOLHDL 5.3 09/10/2013     3. Osteoarthrosis of multiple joints - will discontinue Acetaminophen 325 mg 2 tabs BID and continue 650 mg BID PRN, continue Biofreeze to right knee and right hip q shift   4. Dementia without behavioral disturbance, unspecified dementia type -  continue supportive care and fall precautions    Family/ staff Communication: Discussed plan of care with resident.  Labs/tests ordered:  None  Goals of care:   Long-term care   Durenda Age, NP [facility] [phone] (Monday-Friday 8:00 a.m. - 5:00 p.m.) [phone] (after hours)

## 2017-10-28 ENCOUNTER — Non-Acute Institutional Stay (SKILLED_NURSING_FACILITY): Payer: Medicare Other | Admitting: Adult Health

## 2017-10-28 ENCOUNTER — Encounter: Payer: Self-pay | Admitting: Adult Health

## 2017-10-28 DIAGNOSIS — E785 Hyperlipidemia, unspecified: Secondary | ICD-10-CM

## 2017-10-28 DIAGNOSIS — F0390 Unspecified dementia without behavioral disturbance: Secondary | ICD-10-CM | POA: Diagnosis not present

## 2017-10-28 DIAGNOSIS — E538 Deficiency of other specified B group vitamins: Secondary | ICD-10-CM

## 2017-10-28 DIAGNOSIS — M1710 Unilateral primary osteoarthritis, unspecified knee: Secondary | ICD-10-CM

## 2017-10-28 DIAGNOSIS — IMO0002 Reserved for concepts with insufficient information to code with codable children: Secondary | ICD-10-CM

## 2017-10-28 NOTE — Progress Notes (Signed)
Location:  [facility] Room Number: 305-A Place of Service:  SNF (31) Provider:  Durenda Age, NP  Patient Care Team: [name], MD as PCP - General (Internal Medicine) Rehab, [place] ([place])  Extended Emergency Contact Information Primary Emergency Contact: Deak,[name] Address: [place], [place] [address] [name] of [place] Phone: [phone] Relation: Daughter  Code Status:  Full Code  Goals of care: Advanced Directive information Advanced Directives 02/20/2017  Does Patient Have a Medical Advance Directive? Yes  Type of Advance Directive Out of facility DNR (pink MOST or yellow form)  Does patient want to make changes to medical advance directive? No - Patient declined  Would patient like information on creating a medical advance directive? -  Pre-existing out of facility DNR order (yellow form or pink MOST form) Pink MOST form placed in chart (order not valid for inpatient use);Yellow form placed in chart (order not valid for inpatient use)     Chief Complaint  Patient presents with  . Health Maintenance    Routine [facility] visit    HPI:  Pt is an 84 y.o. male seen today for medical management of chronic diseases.  He is a long-term care resident of [facility].  He has a PMH of B12 deficiency, CKD, CVA, HTN, HLD, OA, prostate cancer, and dementia. He is seen in his room today.  BP review shows well-controlled and not on any medications. Latest weight  136.4 lbs, Body mass index is 19.02 kg/m.   Past Medical History:  Diagnosis Date  . 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 Cancer ([place])   . Chronic kidney disease   . Dementia   . Dysphagia   . Fall   . Hypertension   . Rhabdomyolysis   . Symbolic dysfunction    Social impairment   History reviewed. No pertinent surgical history.  No Known  Allergies  Outpatient Encounter Medications as of 10/28/2017  Medication Sig  . acetaminophen (TYLENOL) 325 MG tablet Take 650 mg by mouth. Take 2 tablets BID scheduled and BID PRN  . Menthol, Topical Analgesic, (BIOFREEZE EX) Apply 1 application topically 3 (three) times daily. Apply to right knee and right hip each shift for pain  . simvastatin (ZOCOR) 10 MG tablet Take 1 tablet (10 mg total) by mouth daily.  . vitamin B-12 (CYANOCOBALAMIN) 1000 MCG tablet Take 1,000 mcg by mouth daily.   No facility-administered encounter medications on file as of 10/28/2017.     Review of Systems  GENERAL: No change in appetite, no fatigue, no weight changes, no fever, chills or weakness MOUTH and THROAT: Denies oral discomfort, gingival pain or bleeding RESPIRATORY: no cough, SOB, DOE, wheezing, hemoptysis CARDIAC: No chest pain, edema or palpitations GI: No abdominal pain, diarrhea, constipation, heart burn, nausea or vomiting GU: Denies dysuria, frequency, hematuria, incontinence, or discharge PSYCHIATRIC: Denies feelings of depression or anxiety. No report of hallucinations, insomnia, paranoia, or agitation   Immunization History  Administered Date(s) Administered  . Influenza Split 05/08/2011, 05/07/2012  . Influenza Whole 04/22/2008, 05/25/2009  . Influenza-Unspecified 05/10/2015, 05/02/2016, 05/15/2017  . PPD Test 12/15/2013, 01/16/2015, 02/15/2015  . Pneumococcal Polysaccharide-23 04/10/2010, 12/15/2013  . Td 04/10/2010   Pertinent  Health Maintenance Due  Topic Date Due  . PNA vac Low Risk Adult (2 of 2 - PCV13) 10/13/2043 (Originally 12/16/2014)  . INFLUENZA VACCINE  02/26/2018  Fall Risk  02/20/2017 08/30/2016 04/12/2016 03/22/2016 05/07/2012  Falls in the past year? No No No No -  Risk for fall due to : - - - - Impaired balance/gait;Impaired mobility      Vitals:   10/28/17 1127  BP: 103/63  Pulse: 68  Resp: 18  Temp: (!) 97.5 F (36.4 C)  TempSrc: Oral  SpO2: 95%  Weight:  136 lb 6.4 oz (61.9 kg)  Height: 5\' 11"  (1.803 m)   Body mass index is 19.02 kg/m.  Physical Exam  GENERAL APPEARANCE: Well nourished. In no acute distress.  SKIN:  Skin is warm and dry.  MOUTH and THROAT: Lips are without lesions. Oral mucosa is moist and without lesions. Tongue is normal in shape, size, and color and without lesions RESPIRATORY: Breathing is even & unlabored, BS CTAB CARDIAC: RRR, no murmur,no extra heart sounds, no edema GI: Abdomen soft, normal BS, no masses, no tenderness EXTREMITIES:  Able to move X 4 extremities, BLE with general PSYCHIATRIC: Alert and oriented X 3. Affect and behavior are appropriate  Labs reviewed: Recent Labs    12/05/16 03/26/17 08/15/17  NA 143 141 140  K 4.0 4.2 4.4  BUN 13 18 15   CREATININE 0.6 0.6 0.5*   Recent Labs    12/05/16 03/26/17 08/15/17  AST 15 17 15   ALT 9* 14 8*  ALKPHOS 50 67 60   Recent Labs    12/05/16 03/26/17 08/15/17  WBC 6.3 6.7 7.2  NEUTROABS  --  4 4  HGB 11.8* 12.3* 13.3*  HCT 37* 38* 41  PLT 272 185 200   Lab Results  Component Value Date   TSH 4.580 (H) 12/12/2013    Lab Results  Component Value Date   CHOL 136 03/26/2017   HDL 34 (A) 03/26/2017   LDLCALC 90 03/26/2017   TRIG 62 03/26/2017   CHOLHDL 5.3 09/10/2013    Assessment/Plan  1. Osteoarthrosis involving lower leg - stable, continue Biofreeze 4% gel to right knee Q shift   2. Hyperlipidemia, unspecified hyperlipidemia type - continue Simvastatin 10 mg Q HS Lab Results  Component Value Date   CHOL 136 03/26/2017   HDL 34 (A) 03/26/2017   LDLCALC 90 03/26/2017   TRIG 62 03/26/2017   CHOLHDL 5.3 09/10/2013     3. Vitamin B12 deficiency - continue Vitamin B12 1,000 mcg daily   4. Dementia without behavioral disturbance, unspecified dementia type - continue supportive care, fall precautions     Family/ staff Communication:  Discussed plan of care with resident and charge nurse.  Labs/tests ordered:  tsh  Goals of  care:   Long-term care.   Durenda Age, NP [facility] [phone] (Monday-Friday 8:00 a.m. - 5:00 p.m.) [phone] (after hours)

## 2017-10-29 ENCOUNTER — Non-Acute Institutional Stay (SKILLED_NURSING_FACILITY): Payer: Medicare Other | Admitting: Adult Health

## 2017-10-29 ENCOUNTER — Encounter: Payer: Self-pay | Admitting: Adult Health

## 2017-10-29 DIAGNOSIS — E785 Hyperlipidemia, unspecified: Secondary | ICD-10-CM | POA: Diagnosis not present

## 2017-10-29 DIAGNOSIS — E538 Deficiency of other specified B group vitamins: Secondary | ICD-10-CM

## 2017-10-29 DIAGNOSIS — Z7409 Other reduced mobility: Secondary | ICD-10-CM | POA: Diagnosis not present

## 2017-10-29 DIAGNOSIS — I129 Hypertensive chronic kidney disease with stage 1 through stage 4 chronic kidney disease, or unspecified chronic kidney disease: Secondary | ICD-10-CM | POA: Diagnosis not present

## 2017-10-29 DIAGNOSIS — F0390 Unspecified dementia without behavioral disturbance: Secondary | ICD-10-CM | POA: Diagnosis not present

## 2017-10-29 DIAGNOSIS — IMO0002 Reserved for concepts with insufficient information to code with codable children: Secondary | ICD-10-CM

## 2017-10-29 DIAGNOSIS — M1710 Unilateral primary osteoarthritis, unspecified knee: Secondary | ICD-10-CM

## 2017-10-29 DIAGNOSIS — E039 Hypothyroidism, unspecified: Secondary | ICD-10-CM | POA: Diagnosis not present

## 2017-10-29 DIAGNOSIS — E561 Deficiency of vitamin K: Secondary | ICD-10-CM | POA: Diagnosis not present

## 2017-10-29 DIAGNOSIS — I10 Essential (primary) hypertension: Secondary | ICD-10-CM | POA: Diagnosis not present

## 2017-10-29 LAB — TSH: TSH: 4.43 (ref 0.41–5.90)

## 2017-10-29 NOTE — Progress Notes (Signed)
Location:  [facility] Room Number: 305-B Place of Service:  SNF (31) Provider:  Durenda Age, NP  Patient Care Team: [name], MD as PCP - General (Internal Medicine) Rehab, [place] ([place])  Extended Emergency Contact Information Primary Emergency Contact: Caffrey,[name] Address: [place], [place] [address] [name] of [place] Phone: [phone] Relation: Daughter  Code Status:  Full Code  Goals of care: Advanced Directive information Advanced Directives 02/20/2017  Does Patient Have a Medical Advance Directive? Yes  Type of Advance Directive Out of facility DNR (pink MOST or yellow form)  Does patient want to make changes to medical advance directive? No - Patient declined  Would patient like information on creating a medical advance directive? -  Pre-existing out of facility DNR order (yellow form or pink MOST form) Pink MOST form placed in chart (order not valid for inpatient use);Yellow form placed in chart (order not valid for inpatient use)     Chief Complaint  Patient presents with  . Advanced Directive    Advanced care planning meeting    HPI:  Pt is an 84 y.o. male seen today for an advanced care planning meeting.  He is a long-term care resident of [facility].  He has a PMH of B12 deficiency, CKD, HTN, HLD, CVA, OA, prostate cancer, and dementia. Care plan meeting was held in his room with NP, social worker and resident. He has requested to have a wheelchair so he can attend activity. There was a broda chair in hisroom but he wants something that he can wheel around. He said that his daughter had a recent surgery that's why she cannot attend the meeting. He wanted to be full code. He said that he is happy being in his room but willing to go out once he has a wheelchair. Medications and diet were reviewed. No change were recommended. His weight has  been stable Body mass index is 19.02 kg/m. The meeting lasted for 20 minutes.   Past Medical History:  Diagnosis Date  . 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 Cancer ([place])   . Chronic kidney disease   . Dementia   . Dysphagia   . Fall   . Hypertension   . Rhabdomyolysis   . Symbolic dysfunction    Social impairment   History reviewed. No pertinent surgical history.  No Known Allergies  Outpatient Encounter Medications as of 10/29/2017  Medication Sig  . acetaminophen (TYLENOL) 325 MG tablet Take 650 mg by mouth. Take 2 tablets BID scheduled and BID PRN  . Menthol, Topical Analgesic, (BIOFREEZE EX) Apply 1 application topically 3 (three) times daily. Apply to right knee and right hip each shift for pain  . simvastatin (ZOCOR) 10 MG tablet Take 1 tablet (10 mg total) by mouth daily.  . vitamin B-12 (CYANOCOBALAMIN) 1000 MCG tablet Take 1,000 mcg by mouth daily.   No facility-administered encounter medications on file as of 10/29/2017.     Review of Systems  GENERAL: No change in appetite, no fatigue, no weight changes, no fever, chills or weakness MOUTH and THROAT: Denies oral discomfort, gingival pain or bleeding RESPIRATORY: no cough, SOB, DOE, wheezing, hemoptysis CARDIAC: No chest pain, edema or palpitations GI: No abdominal pain, diarrhea, constipation, heart burn, nausea or vomiting PSYCHIATRIC: Denies feelings of depression or anxiety. No report of hallucinations, insomnia, paranoia, or agitation  Immunization History  Administered Date(s) Administered  . Influenza Split 05/08/2011, 05/07/2012  . Influenza Whole 04/22/2008, 05/25/2009  . Influenza-Unspecified 05/10/2015, 05/02/2016, 05/15/2017  . PPD Test 12/15/2013, 01/16/2015, 02/15/2015  . Pneumococcal Polysaccharide-23 04/10/2010, 12/15/2013  . Td 04/10/2010   Pertinent  Health Maintenance Due  Topic Date Due  . PNA vac Low Risk Adult (2 of 2 - PCV13) 10/13/2043 (Originally 12/16/2014)   . INFLUENZA VACCINE  02/26/2018   Fall Risk  02/20/2017 08/30/2016 04/12/2016 03/22/2016 05/07/2012  Falls in the past year? No No No No -  Risk for fall due to : - - - - Impaired balance/gait;Impaired mobility     Vitals:   10/29/17 1008  BP: 124/74  Pulse: 68  Resp: 18  Temp: 97.8 F (36.6 C)  TempSrc: Oral  SpO2: 96%  Weight: 136 lb 6.4 oz (61.9 kg)  Height: 5\' 11"  (1.803 m)   Body mass index is 19.02 kg/m.  Physical Exam  GENERAL APPEARANCE:  In no acute distress. SKIN:  Skin is warm and dry.  MOUTH and THROAT: Lips are without lesions. Oral mucosa is moist and without lesions. Tongue is normal in shape, size, and color and without lesions RESPIRATORY: Breathing is even & unlabored, BS CTAB CARDIAC: RRR, no murmur,no extra heart sounds, no edema GI: Abdomen soft, normal BS, no masses, no tenderness EXTREMITIES: Able to move X 4 extremities, BLE with generalized weakness PSYCHIATRIC: Alert and oriented X 3. Affesct and behavior are appropriate  Labs reviewed: Recent Labs    12/05/16 03/26/17 08/15/17  NA 143 141 140  K 4.0 4.2 4.4  BUN 13 18 15   CREATININE 0.6 0.6 0.5*   Recent Labs    12/05/16 03/26/17 08/15/17  AST 15 17 15   ALT 9* 14 8*  ALKPHOS 50 67 60   Recent Labs    12/05/16 03/26/17 08/15/17  WBC 6.3 6.7 7.2  NEUTROABS  --  4 4  HGB 11.8* 12.3* 13.3*  HCT 37* 38* 41  PLT 272 185 200   Lab Results  Component Value Date   TSH 4.580 (H) 12/12/2013    Lab Results  Component Value Date   CHOL 136 03/26/2017   HDL 34 (A) 03/26/2017   LDLCALC 90 03/26/2017   TRIG 62 03/26/2017   CHOLHDL 5.3 09/10/2013    Assessment/Plan  1. Impaired physical mobility - resident has requested for a wheelchair that he can wheel himself around, will recommend to have PT evaluation on the use of wheelchair   2. Vitamin B12 deficiency - continue Vitamin B12 1,000 mcg 1 tab daily   3. Hyperlipidemia, unspecified hyperlipidemia type - continue Simvastatin 10 mg  1 tab daily   4. Osteoarthrosis involving lower leg - continue Acetaminophen 650 mg BID PRN and Biofreeze gel   5. Dementia without behavioral disturbance, unspecified dementia type - continue supportive care and fall precautions    Family/ staff Communication: Discussed plan of care with resident.  Labs/tests ordered:  None  Goals of care:   Long-term care  Durenda Age, NP [facility] [phone] (Monday-Friday 8:00 a.m. - 5:00 p.m.) [phone] (after hours)

## 2017-11-27 ENCOUNTER — Encounter: Payer: Self-pay | Admitting: Internal Medicine

## 2017-11-27 ENCOUNTER — Non-Acute Institutional Stay (SKILLED_NURSING_FACILITY): Payer: Medicare Other | Admitting: Internal Medicine

## 2017-11-27 DIAGNOSIS — F0390 Unspecified dementia without behavioral disturbance: Secondary | ICD-10-CM

## 2017-11-27 DIAGNOSIS — E538 Deficiency of other specified B group vitamins: Secondary | ICD-10-CM | POA: Diagnosis not present

## 2017-11-27 DIAGNOSIS — I10 Essential (primary) hypertension: Secondary | ICD-10-CM

## 2017-11-27 DIAGNOSIS — E441 Mild protein-calorie malnutrition: Secondary | ICD-10-CM | POA: Diagnosis not present

## 2017-11-27 NOTE — Assessment & Plan Note (Addendum)
11/27/17 patient was oriented to date Recheck B12 level

## 2017-11-27 NOTE — Progress Notes (Signed)
NURSING HOME LOCATION:  [facility] ROOM NUMBER:  305-B  CODE STATUS:  Full Code  PCP:  [name], MD  [place] [address]  This is a nursing facility follow up of chronic medical diagnoses. Interim medical record and care since last [place] visit was updated with review of diagnostic studies and change in clinical status since last visit were documented.  HPI: He is a permanent resident of SNF with medical diagnoses of essential hypertension, dementia without behavioral disturbance, osteoarthrosis, CKD stage III, history of prostate cancer, B12 deficiency with macrocytic anemia, stroke complicated by dysphagia, dyslipidemia, and protein-caloric malnutrition with associated adult failure to thrive. Most recent labs are 08/15/17. Creatinine was 0.5 in the context of the protein-caloric malnutrition. The last albumin on record was 3.3 on 02/12/14. Hemoglobin was minimally reduced at 13.3, hematocrit was normal at 41. No indices were documented. His last B12 level was 650 on 03/28/17.  Review of systems:  Date given as "2, 01" initially but then corrected to 11/27/2017. Other than "arthritis in the kneecaps" he has no complaints. His response to all queries was "uh-uh". Constitutional: No fever, significant weight change, fatigue  Eyes: No redness, discharge, pain, vision change ENT/mouth: No nasal congestion,  purulent discharge, earache, change in hearing, sore throat  Cardiovascular: No chest pain, palpitations, paroxysmal nocturnal dyspnea, claudication, edema  Respiratory: No cough, sputum production, hemoptysis, DOE , significant snoring, apnea   Gastrointestinal: No heartburn, dysphagia, abdominal pain, nausea /vomiting, rectal bleeding, melena, change in bowels Genitourinary: No dysuria, hematuria, pyuria, incontinence, nocturia Dermatologic: No rash, pruritus, change in appearance of skin Neurologic: No dizziness, headache, syncope, seizures,  numbness, tingling Psychiatric: No significant anxiety, depression, insomnia, anorexia Endocrine: No change in hair/skin/ nails, excessive thirst, excessive hunger, excessive urination  Hematologic/lymphatic: No significant bruising, lymphadenopathy, abnormal bleeding Allergy/immunology: No itchy/watery eyes, significant sneezing, urticaria, angioedema  Physical exam:  Pertinent or positive findings: Blood pressure is low as documented without antihypertensive medications. He appears somewhat cachectic and chronically ill. He has pattern baldness, remaining hair is disheveled. His eyes are sunken. The mouth is agape and edentulous. Has dense arcus senilis. Heart sounds are markedly distant. He has minor rhonchi at the bases. Lower extremities are weak to opposition and he has wasting the gastrocnemius muscles. He has severe thickening & deformities of the toenails and contractures of the toes. The lower extremities are supported in booties. Clubbing of the fingernails is present.  General appearance:  no acute distress, increased work of breathing is present.   Lymphatic: No lymphadenopathy about the head, neck, axilla. Eyes: No conjunctival inflammation or lid edema is present. There is no scleral icterus. Ears:  External ear exam shows no significant lesions or deformities.   Nose:  External nasal examination shows no deformity or inflammation. Nasal mucosa are pink and moist without lesions, exudates Oral exam:  Lips and gums are healthy appearing. There is no oropharyngeal erythema or exudate. Neck:  No thyromegaly, masses, tenderness noted.    Heart:  Normal rate and regular rhythm. S1 and S2 normal without gallop, murmur, click, rub .  Lungs: without wheezes,rales , rubs. Abdomen:Bowel sounds are normal. Abdomen is soft and nontender with no organomegaly, hernias,masses. GU: deferred  Extremities:  No cyanosis, edema  Neurologic exam : Balance,Rhomberg,finger to nose testing could not be  completed due to clinical state Deep tendon reflexes are equal Skin: Warm & dry w/o tenting. No significant lesions or rash.  See summary under each  active problem in the Problem List with associated updated therapeutic plan

## 2017-11-27 NOTE — Assessment & Plan Note (Signed)
Recheck B12 level 

## 2017-11-27 NOTE — Patient Instructions (Signed)
See assessment and plan under each diagnosis in the problem list and acutely for this visit 

## 2017-11-28 ENCOUNTER — Encounter: Payer: Self-pay | Admitting: Internal Medicine

## 2017-11-28 DIAGNOSIS — I10 Essential (primary) hypertension: Secondary | ICD-10-CM | POA: Diagnosis not present

## 2017-11-28 DIAGNOSIS — D649 Anemia, unspecified: Secondary | ICD-10-CM | POA: Diagnosis not present

## 2017-11-28 DIAGNOSIS — D510 Vitamin B12 deficiency anemia due to intrinsic factor deficiency: Secondary | ICD-10-CM | POA: Diagnosis not present

## 2017-11-28 DIAGNOSIS — M6282 Rhabdomyolysis: Secondary | ICD-10-CM | POA: Diagnosis not present

## 2017-11-28 DIAGNOSIS — I959 Hypotension, unspecified: Secondary | ICD-10-CM | POA: Diagnosis not present

## 2017-11-28 DIAGNOSIS — I129 Hypertensive chronic kidney disease with stage 1 through stage 4 chronic kidney disease, or unspecified chronic kidney disease: Secondary | ICD-10-CM | POA: Diagnosis not present

## 2017-11-28 LAB — BASIC METABOLIC PANEL
BUN: 17 (ref 4–21)
Creatinine: 0.5 — AB (ref 0.6–1.3)
Glucose: 88
POTASSIUM: 4.2 (ref 3.4–5.3)
SODIUM: 139 (ref 137–147)

## 2017-11-28 LAB — HEPATIC FUNCTION PANEL
ALT: 5 — AB (ref 10–40)
AST: 12 — AB (ref 14–40)
Alkaline Phosphatase: 79 (ref 25–125)
Bilirubin, Total: 0.4

## 2017-11-28 LAB — VITAMIN B12: Vitamin B-12: 1697

## 2017-11-28 NOTE — Assessment & Plan Note (Signed)
BP controlled; no indication for antihypertensive medications  

## 2017-12-26 DIAGNOSIS — H2513 Age-related nuclear cataract, bilateral: Secondary | ICD-10-CM | POA: Diagnosis not present

## 2017-12-26 DIAGNOSIS — H353131 Nonexudative age-related macular degeneration, bilateral, early dry stage: Secondary | ICD-10-CM | POA: Diagnosis not present

## 2017-12-26 DIAGNOSIS — H5509 Other forms of nystagmus: Secondary | ICD-10-CM | POA: Diagnosis not present

## 2017-12-26 DIAGNOSIS — H1789 Other corneal scars and opacities: Secondary | ICD-10-CM | POA: Diagnosis not present

## 2017-12-26 LAB — HM DIABETES EYE EXAM

## 2018-01-02 ENCOUNTER — Encounter: Payer: Self-pay | Admitting: Adult Health

## 2018-01-02 ENCOUNTER — Non-Acute Institutional Stay (SKILLED_NURSING_FACILITY): Payer: Medicare Other | Admitting: Adult Health

## 2018-01-02 DIAGNOSIS — I739 Peripheral vascular disease, unspecified: Secondary | ICD-10-CM | POA: Diagnosis not present

## 2018-01-02 DIAGNOSIS — F0390 Unspecified dementia without behavioral disturbance: Secondary | ICD-10-CM | POA: Diagnosis not present

## 2018-01-02 DIAGNOSIS — E538 Deficiency of other specified B group vitamins: Secondary | ICD-10-CM

## 2018-01-02 DIAGNOSIS — E785 Hyperlipidemia, unspecified: Secondary | ICD-10-CM | POA: Diagnosis not present

## 2018-01-02 DIAGNOSIS — I10 Essential (primary) hypertension: Secondary | ICD-10-CM | POA: Diagnosis not present

## 2018-01-02 DIAGNOSIS — R627 Adult failure to thrive: Secondary | ICD-10-CM | POA: Diagnosis not present

## 2018-01-02 DIAGNOSIS — B351 Tinea unguium: Secondary | ICD-10-CM | POA: Diagnosis not present

## 2018-01-02 NOTE — Progress Notes (Signed)
Location:  [facility] Room Number: 305-B Place of Service:  SNF (31) Provider:  Durenda Age, NP  Patient Care Team: [name], MD as PCP - General (Internal Medicine) Rehab, [name]  Extended Emergency Contact Information Primary Emergency Contact: [name],[name] Address: [place], [place] [address] [name] Phone: [phone] Relation: Daughter  Code Status:  Full Code  Goals of care: Advanced Directive information Advanced Directives 02/20/2017  Does Patient Have a Medical Advance Directive? Yes  Type of Advance Directive Out of facility DNR (pink MOST or yellow form)  Does patient want to make changes to medical advance directive? No - Patient declined  Would patient like information on creating a medical advance directive? -  Pre-existing out of facility DNR order (yellow form or pink MOST form) Pink MOST form placed in chart (order not valid for inpatient use);Yellow form placed in chart (order not valid for inpatient use)     Chief Complaint  Patient presents with  . Medical Management of Chronic Issues    Patient is seen for a routine [facility] visit    HPI:  Pt is an 84 y.o. male seen today for medical management of chronic diseases.  He is a long-term care resident of [facility].  He has a PMH of B12 deficiency, CKD, HTN, HLD, CVA, OA, prostate cancer, and dementia. He was seen in his room today. He prefers to stay in his room. Latest weight 132.4 lbs, Body mass index is 18.47 kg/m.    Past Medical History:  Diagnosis Date  . 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 Cancer ([place])   . Chronic kidney disease   . Dementia   . Dysphagia   . Fall   . Hypertension   . Rhabdomyolysis   . Symbolic dysfunction    Social impairment   History reviewed. No pertinent surgical history.  No Known  Allergies  Outpatient Encounter Medications as of 01/02/2018  Medication Sig  . acetaminophen (TYLENOL) 325 MG tablet Take 650 mg by mouth 2 (two) times daily as needed for mild pain, moderate pain or fever.   . Menthol, Topical Analgesic, (BIOFREEZE EX) Apply 1 application topically 3 (three) times daily. Apply to right knee and right hip each shift for pain  . simvastatin (ZOCOR) 10 MG tablet Take 1 tablet (10 mg total) by mouth daily.  . vitamin B-12 (CYANOCOBALAMIN) 500 MCG tablet Take 500 mcg by mouth daily.  . [DISCONTINUED] vitamin B-12 (CYANOCOBALAMIN) 1000 MCG tablet Take 1,000 mcg by mouth daily.   No facility-administered encounter medications on file as of 01/02/2018.     Review of Systems  GENERAL: No change in appetite, no fatigue, no weight changes, no fever, chills or weakness MOUTH and THROAT: Denies oral discomfort, gingival pain or bleeding RESPIRATORY: no cough, SOB, DOE, wheezing, hemoptysis CARDIAC: No chest pain, edema or palpitations GI: No abdominal pain, diarrhea, constipation, heart burn, nausea or vomiting PSYCHIATRIC: Denies feelings of depression or anxiety. No report of hallucinations, insomnia, paranoia, or agitation    Immunization History  Administered Date(s) Administered  . Influenza Split 05/08/2011, 05/07/2012  . Influenza Whole 04/22/2008, 05/25/2009  . Influenza-Unspecified 05/10/2015, 05/02/2016, 05/15/2017  . PPD Test 12/15/2013, 01/16/2015, 02/15/2015  . Pneumococcal Polysaccharide-23 04/10/2010, 12/15/2013  . Td 04/10/2010   Pertinent  Health Maintenance Due  Topic Date Due  .  PNA vac Low Risk Adult (2 of 2 - PCV13) 10/13/2043 (Originally 12/16/2014)  . INFLUENZA VACCINE  02/26/2018   Fall Risk  02/20/2017 08/30/2016 04/12/2016 03/22/2016 05/07/2012  Falls in the past year? No No No No -  Risk for fall due to : - - - - Impaired balance/gait;Impaired mobility      Vitals:   01/02/18 0854  BP: 140/62  Pulse: 73  Resp: 16  Temp: (!) 97  F (36.1 C)  TempSrc: Oral  SpO2: 97%  Weight: 132 lb 6.4 oz (60.1 kg)  Height: 5\' 11"  (1.803 m)   Body mass index is 18.47 kg/m.  Physical Exam  GENERAL APPEARANCE: In no acute distress.  MOUTH and THROAT: Lips are without lesions. Oral mucosa is moist and without lesions. Tongue is normal in shape, size, and color and without lesions RESPIRATORY: Breathing is even & unlabored, BS CTAB CARDIAC: RRR, no murmur,no extra heart sounds, no edema GI: Abdomen soft, normal BS, no masses, no tenderness EXTREMITIES:  Able to move X 4 extremities, BLE with generalized weakness PSYCHIATRIC: Alert and oriented X 3. Affect and behavior are appropriate   Labs reviewed: Recent Labs    03/26/17 08/15/17 11/28/17  NA 141 140 139  K 4.2 4.4 4.2  BUN 18 15 17   CREATININE 0.6 0.5* 0.5*   Recent Labs    03/26/17 08/15/17 11/28/17  AST 17 15 12*  ALT 14 8* 5*  ALKPHOS 67 60 79   Recent Labs    03/26/17 08/15/17  WBC 6.7 7.2  NEUTROABS 4 4  HGB 12.3* 13.3*  HCT 38* 41  PLT 185 200   Lab Results  Component Value Date   TSH 4.43 10/29/2017   No results found for: HGBA1C Lab Results  Component Value Date   CHOL 136 03/26/2017   HDL 34 (A) 03/26/2017   LDLCALC 90 03/26/2017   TRIG 62 03/26/2017   CHOLHDL 5.3 09/10/2013    Assessment/Plan  1. Failure to thrive in adult - Body mass index is 18.47 kg/m., weight 132.4 lbs, will start magic cup BID    2. Vitamin B12 deficiency - continue Vitamin B12 500 mcg daily   3. Hyperlipidemia, unspecified hyperlipidemia type - continue Simvastatin 10 mg daily Lab Results  Component Value Date   CHOL 136 03/26/2017   HDL 34 (A) 03/26/2017   LDLCALC 90 03/26/2017   TRIG 62 03/26/2017   CHOLHDL 5.3 09/10/2013     4. HYPERTENSION, BENIGN SYSTEMIC - stable, not on any medications   5. Dementia without behavioral disturbance, unspecified dementia type - continue supportive care, fall precautions    Family/ staff Communication:  Discussed plan of care with resident.  Labs/tests ordered:  None  Goals of care:   Long-term care  Durenda Age, NP [facility] Adult [facility] [phone] (Monday-Friday 8:00 a.m. - 5:00 p.m.) [phone] (after hours)

## 2018-01-12 DIAGNOSIS — S51001D Unspecified open wound of right elbow, subsequent encounter: Secondary | ICD-10-CM | POA: Diagnosis not present

## 2018-01-12 DIAGNOSIS — S51001A Unspecified open wound of right elbow, initial encounter: Secondary | ICD-10-CM | POA: Diagnosis not present

## 2018-01-22 DIAGNOSIS — S51001A Unspecified open wound of right elbow, initial encounter: Secondary | ICD-10-CM | POA: Diagnosis not present

## 2018-01-22 DIAGNOSIS — S51001D Unspecified open wound of right elbow, subsequent encounter: Secondary | ICD-10-CM | POA: Diagnosis not present

## 2018-01-26 DIAGNOSIS — S51001D Unspecified open wound of right elbow, subsequent encounter: Secondary | ICD-10-CM | POA: Diagnosis not present

## 2018-01-26 DIAGNOSIS — S51001A Unspecified open wound of right elbow, initial encounter: Secondary | ICD-10-CM | POA: Diagnosis not present

## 2018-02-02 ENCOUNTER — Non-Acute Institutional Stay (SKILLED_NURSING_FACILITY): Payer: Medicare Other | Admitting: Adult Health

## 2018-02-02 ENCOUNTER — Encounter: Payer: Self-pay | Admitting: Adult Health

## 2018-02-02 DIAGNOSIS — S51001A Unspecified open wound of right elbow, initial encounter: Secondary | ICD-10-CM | POA: Diagnosis not present

## 2018-02-02 DIAGNOSIS — E785 Hyperlipidemia, unspecified: Secondary | ICD-10-CM

## 2018-02-02 DIAGNOSIS — E538 Deficiency of other specified B group vitamins: Secondary | ICD-10-CM

## 2018-02-02 DIAGNOSIS — M1710 Unilateral primary osteoarthritis, unspecified knee: Secondary | ICD-10-CM

## 2018-02-02 DIAGNOSIS — F0390 Unspecified dementia without behavioral disturbance: Secondary | ICD-10-CM | POA: Diagnosis not present

## 2018-02-02 DIAGNOSIS — IMO0002 Reserved for concepts with insufficient information to code with codable children: Secondary | ICD-10-CM

## 2018-02-02 DIAGNOSIS — S51001D Unspecified open wound of right elbow, subsequent encounter: Secondary | ICD-10-CM | POA: Diagnosis not present

## 2018-02-02 NOTE — Progress Notes (Signed)
Location:  [facility] Room Number: 305-B Place of Service:  SNF (31) Provider:  Durenda Age, NP  Patient Care Team: [name], MD as PCP - General (Internal Medicine) Rehab, [place] ([place])  Extended Emergency Contact Information Primary Emergency Contact: Taketa,[name] Address: [place], [place] [address] [name] of [place] Phone: [phone] Relation: Daughter  Code Status:  Full Code  Goals of care: Advanced Directive information Advanced Directives 02/20/2017  Does Patient Have a Medical Advance Directive? Yes  Type of Advance Directive Out of facility DNR (pink MOST or yellow form)  Does patient want to make changes to medical advance directive? No - Patient declined  Would patient like information on creating a medical advance directive? -  Pre-existing out of facility DNR order (yellow form or pink MOST form) Pink MOST form placed in chart (order not valid for inpatient use);Yellow form placed in chart (order not valid for inpatient use)     Chief Complaint  Patient presents with  . Medical Management of Chronic Issues    The patient is seen for a routine [facility] visit    HPI:  Pt is a 84 y.o. male seen today for medical management of chronic diseases.  He is a long-term care resident of [facility].  He has a PMH of CKD,  hypertension, B12 deficiency, HLD, CVA, OA, prostate cancer, and dementia. He is currently having restorative nursing for active and passive ROM of BUE.    Past Medical History:  Diagnosis Date  . 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 Cancer ([place])   . Chronic kidney disease   . Dementia   . Dysphagia   . Fall   . Hypertension   . Rhabdomyolysis   . Symbolic dysfunction    Social impairment   History reviewed. No pertinent surgical history.  No Known Allergies  Outpatient Encounter  Medications as of 02/02/2018  Medication Sig  . acetaminophen (TYLENOL) 325 MG tablet Take 650 mg by mouth 2 (two) times daily as needed for mild pain, moderate pain or fever.   . Amino Acids-Protein Hydrolys (FEEDING SUPPLEMENT, PRO-STAT SUGAR FREE 64,) LIQD Take 30 mLs by mouth daily.  . Menthol, Topical Analgesic, (BIOFREEZE EX) Apply 1 application topically 3 (three) times daily. Apply to right knee and right hip each shift for pain  . Nutritional Supplements (NUTRITIONAL SUPPLEMENT PO) Take 1 each by mouth 2 (two) times daily. Magic Cup  . simvastatin (ZOCOR) 10 MG tablet Take 1 tablet (10 mg total) by mouth daily.  . vitamin B-12 (CYANOCOBALAMIN) 500 MCG tablet Take 500 mcg by mouth daily.   No facility-administered encounter medications on file as of 02/02/2018.     Review of Systems  GENERAL: No change in appetite, no fatigue, no weight changes, no fever, chills or weakness MOUTH and THROAT: Denies oral discomfort, gingival pain or bleeding RESPIRATORY: no cough, SOB, DOE, wheezing, hemoptysis CARDIAC: No chest pain, edema or palpitations GI: No abdominal pain, diarrhea, constipation, heart burn, nausea or vomiting GU: Denies dysuria, frequency, hematuria, incontinence, or discharge PSYCHIATRIC: Denies feelings of depression or anxiety. No report of hallucinations, insomnia, paranoia, or agitation   Immunization History  Administered Date(s) Administered  . Influenza Split 05/08/2011, 05/07/2012  . Influenza Whole 04/22/2008, 05/25/2009  . Influenza-Unspecified 05/10/2015, 05/02/2016, 05/15/2017  . PPD Test 12/15/2013, 01/16/2015, 02/15/2015  . Pneumococcal Polysaccharide-23  04/10/2010, 12/15/2013  . Td 04/10/2010   Pertinent  Health Maintenance Due  Topic Date Due  . PNA vac Low Risk Adult (2 of 2 - PCV13) 10/13/2043 (Originally 12/16/2014)  . INFLUENZA VACCINE  02/26/2018   Fall Risk  02/20/2017 08/30/2016 04/12/2016 03/22/2016 05/07/2012  Falls in the past year? No No No No -    Risk for fall due to : - - - - Impaired balance/gait;Impaired mobility      Vitals:   02/02/18 0731  BP: 118/76  Pulse: 81  Resp: 18  Temp: (!) 97.1 F (36.2 C)  TempSrc: Oral  SpO2: 95%  Weight: 132 lb 6.4 oz (60.1 kg)  Height: 5\' 11"  (1.803 m)   Body mass index is 18.47 kg/m.  Physical Exam  GENERAL APPEARANCE:  In no acute distress.  SKIN:  Right elbow with open wound, dry and no erythema MOUTH and THROAT: Lips are without lesions. Oral mucosa is moist and without lesions. Tongue is normal in shape, size, and color and without lesions RESPIRATORY: Breathing is even & unlabored, BS CTAB CARDIAC: RRR, no murmur,no extra heart sounds, no edema GI: Abdomen soft, normal BS, no masses, no tenderness EXTREMITIES:  Able to move BUE, BLE has generalized weakness PSYCHIATRIC: Alert and oriented X 3. Affect and behavior are appropriate   Labs reviewed: Recent Labs    03/26/17 08/15/17 11/28/17  NA 141 140 139  K 4.2 4.4 4.2  BUN 18 15 17   CREATININE 0.6 0.5* 0.5*   Recent Labs    03/26/17 08/15/17 11/28/17  AST 17 15 12*  ALT 14 8* 5*  ALKPHOS 67 60 79   Recent Labs    03/26/17 08/15/17  WBC 6.7 7.2  NEUTROABS 4 4  HGB 12.3* 13.3*  HCT 38* 41  PLT 185 200   Lab Results  Component Value Date   TSH 4.43 10/29/2017   No results found for: HGBA1C Lab Results  Component Value Date   CHOL 136 03/26/2017   HDL 34 (A) 03/26/2017   LDLCALC 90 03/26/2017   TRIG 62 03/26/2017   CHOLHDL 5.3 09/10/2013    Assessment/Plan  1. Hyperlipidemia, unspecified hyperlipidemia type - continue Simvastatin 10 mg daily Lab Results  Component Value Date   CHOL 136 03/26/2017   HDL 34 (A) 03/26/2017   LDLCALC 90 03/26/2017   TRIG 62 03/26/2017   CHOLHDL 5.3 09/10/2013     2. Vitamin B12 deficiency - continue Vitamin B 12 500 mcg 1 tab daily   3. Osteoarthrosis involving lower leg - stable, continue Biofreeze 4% gel topically to right knee Q shift   4. Dementia  without behavioral disturbance, unspecified dementia type -  Prefers to stay in his room, continue supportive care, fall precautions and encouraged to attend social activities outside his room     Family/ staff Communication: Discussed plan of care with resident.  Labs/tests ordered:  None  Goals of care:   Long-term care.   Durenda Age, NP [facility] and Adult Medicine [phone] (Monday-Friday 8:00 a.m. - 5:00 p.m.) [phone] (after hours)\

## 2018-02-09 DIAGNOSIS — S51001D Unspecified open wound of right elbow, subsequent encounter: Secondary | ICD-10-CM | POA: Diagnosis not present

## 2018-02-09 DIAGNOSIS — S51001A Unspecified open wound of right elbow, initial encounter: Secondary | ICD-10-CM | POA: Diagnosis not present

## 2018-02-16 DIAGNOSIS — S51001A Unspecified open wound of right elbow, initial encounter: Secondary | ICD-10-CM | POA: Diagnosis not present

## 2018-02-16 DIAGNOSIS — S51001D Unspecified open wound of right elbow, subsequent encounter: Secondary | ICD-10-CM | POA: Diagnosis not present

## 2018-02-23 DIAGNOSIS — S51001D Unspecified open wound of right elbow, subsequent encounter: Secondary | ICD-10-CM | POA: Diagnosis not present

## 2018-02-23 DIAGNOSIS — S51001A Unspecified open wound of right elbow, initial encounter: Secondary | ICD-10-CM | POA: Diagnosis not present

## 2018-02-24 ENCOUNTER — Non-Acute Institutional Stay (SKILLED_NURSING_FACILITY): Payer: Medicare Other

## 2018-02-24 DIAGNOSIS — Z23 Encounter for immunization: Secondary | ICD-10-CM | POA: Diagnosis not present

## 2018-02-24 DIAGNOSIS — Z0000 Encounter for general adult medical examination without abnormal findings: Secondary | ICD-10-CM | POA: Diagnosis not present

## 2018-02-24 NOTE — Patient Instructions (Addendum)
[name] , Thank you for taking time to come for your Medicare Wellness Visit. I appreciate your ongoing commitment to your health goals. Please review the following plan we discussed and let me know if I can assist you in the future.   Screening recommendations/referrals: Colonoscopy excluded, over age 82 Recommended yearly ophthalmology/optometry visit for glaucoma screening and checkup Recommended yearly dental visit for hygiene and checkup  Vaccinations: Influenza vaccine up to date, due 2019 fall season Pneumococcal vaccine 13 due, ordered.  Tdap vaccine up to date, due 04/10/2020 Shingles vaccine not in past records    Advanced directives: in chart  Conditions/risks identified: none  Next appointment: Dr. Linna Darner makes rounds  Preventive Care 82 Years and Older, Male Preventive care refers to lifestyle choices and visits with your health care provider that can promote health and wellness. What does preventive care include?  A yearly physical exam. This is also called an annual well check.  Dental exams once or twice a year.  Routine eye exams. Ask your health care provider how often you should have your eyes checked.  Personal lifestyle choices, including:  Daily care of your teeth and gums.  Regular physical activity.  Eating a healthy diet.  Avoiding tobacco and drug use.  Limiting alcohol use.  Practicing safe sex.  Taking low doses of aspirin every day.  Taking vitamin and mineral supplements as recommended by your health care provider. What happens during an annual well check? The services and screenings done by your health care provider during your annual well check will depend on your age, overall health, lifestyle risk factors, and family history of disease. Counseling  Your health care provider may ask you questions about your:  Alcohol use.  Tobacco use.  Drug use.  Emotional well-being.  Home and relationship well-being.  Sexual  activity.  Eating habits.  History of falls.  Memory and ability to understand (cognition).  Work and work Statistician. Screening  You may have the following tests or measurements:  Height, weight, and BMI.  Blood pressure.  Lipid and cholesterol levels. These may be checked every 5 years, or more frequently if you are over 68 years old.  Skin check.  Lung cancer screening. You may have this screening every year starting at age 72 if you have a 30-pack-year history of smoking and currently smoke or have quit within the past 15 years.  Fecal occult blood test (FOBT) of the stool. You may have this test every year starting at age 1.  Flexible sigmoidoscopy or colonoscopy. You may have a sigmoidoscopy every 5 years or a colonoscopy every 10 years starting at age 63.  Prostate cancer screening. Recommendations will vary depending on your family history and other risks.  Hepatitis C blood test.  Hepatitis B blood test.  Sexually transmitted disease (STD) testing.  Diabetes screening. This is done by checking your blood sugar (glucose) after you have not eaten for a while (fasting). You may have this done every 1-3 years.  Abdominal aortic aneurysm (AAA) screening. You may need this if you are a current or former smoker.  Osteoporosis. You may be screened starting at age 43 if you are at high risk. Talk with your health care provider about your test results, treatment options, and if necessary, the need for more tests. Vaccines  Your health care provider may recommend certain vaccines, such as:  Influenza vaccine. This is recommended every year.  Tetanus, diphtheria, and acellular pertussis (Tdap, Td) vaccine. You may need a  Td booster every 10 years.  Zoster vaccine. You may need this after age 86.  Pneumococcal 13-valent conjugate (PCV13) vaccine. One dose is recommended after age 70.  Pneumococcal polysaccharide (PPSV23) vaccine. One dose is recommended after age  69. Talk to your health care provider about which screenings and vaccines you need and how often you need them. This information is not intended to replace advice given to you by your health care provider. Make sure you discuss any questions you have with your health care provider. Document Released: 08/11/2015 Document Revised: 04/03/2016 Document Reviewed: 05/16/2015 Elsevier Interactive Patient Education  2017 [place] Prevention in the Home Falls can cause injuries. They can happen to people of all ages. There are many things you can do to make your home safe and to help prevent falls. What can I do on the outside of my home?  Regularly fix the edges of walkways and driveways and fix any cracks.  Remove anything that might make you trip as you walk through a door, such as a raised step or threshold.  Trim any bushes or trees on the path to your home.  Use bright outdoor lighting.  Clear any walking paths of anything that might make someone trip, such as rocks or tools.  Regularly check to see if handrails are loose or broken. Make sure that both sides of any steps have handrails.  Any raised decks and porches should have guardrails on the edges.  Have any leaves, snow, or ice cleared regularly.  Use sand or salt on walking paths during winter.  Clean up any spills in your garage right away. This includes oil or grease spills. What can I do in the bathroom?  Use night lights.  Install grab bars by the toilet and in the tub and shower. Do not use towel bars as grab bars.  Use non-skid mats or decals in the tub or shower.  If you need to sit down in the shower, use a plastic, non-slip stool.  Keep the floor dry. Clean up any water that spills on the floor as soon as it happens.  Remove soap buildup in the tub or shower regularly.  Attach bath mats securely with double-sided non-slip rug tape.  Do not have throw rugs and other things on the floor that can make  you trip. What can I do in the bedroom?  Use night lights.  Make sure that you have a light by your bed that is easy to reach.  Do not use any sheets or blankets that are too big for your bed. They should not hang down onto the floor.  Have a firm chair that has side arms. You can use this for support while you get dressed.  Do not have throw rugs and other things on the floor that can make you trip. What can I do in the kitchen?  Clean up any spills right away.  Avoid walking on wet floors.  Keep items that you use a lot in easy-to-reach places.  If you need to reach something above you, use a strong step stool that has a grab bar.  Keep electrical cords out of the way.  Do not use floor polish or wax that makes floors slippery. If you must use wax, use non-skid floor wax.  Do not have throw rugs and other things on the floor that can make you trip. What can I do with my stairs?  Do not leave any items on the stairs.  Make sure that there are handrails on both sides of the stairs and use them. Fix handrails that are broken or loose. Make sure that handrails are as long as the stairways.  Check any carpeting to make sure that it is firmly attached to the stairs. Fix any carpet that is loose or worn.  Avoid having throw rugs at the top or bottom of the stairs. If you do have throw rugs, attach them to the floor with carpet tape.  Make sure that you have a light switch at the top of the stairs and the bottom of the stairs. If you do not have them, ask someone to add them for you. What else can I do to help prevent falls?  Wear shoes that:  Do not have high heels.  Have rubber bottoms.  Are comfortable and fit you well.  Are closed at the toe. Do not wear sandals.  If you use a stepladder:  Make sure that it is fully opened. Do not climb a closed stepladder.  Make sure that both sides of the stepladder are locked into place.  Ask someone to hold it for you, if  possible.  Clearly mark and make sure that you can see:  Any grab bars or handrails.  First and last steps.  Where the edge of each step is.  Use tools that help you move around (mobility aids) if they are needed. These include:  Canes.  Walkers.  Scooters.  Crutches.  Turn on the lights when you go into a dark area. Replace any light bulbs as soon as they burn out.  Set up your furniture so you have a clear path. Avoid moving your furniture around.  If any of your floors are uneven, fix them.  If there are any pets around you, be aware of where they are.  Review your medicines with your doctor. Some medicines can make you feel dizzy. This can increase your chance of falling. Ask your doctor what other things that you can do to help prevent falls. This information is not intended to replace advice given to you by your health care provider. Make sure you discuss any questions you have with your health care provider. Document Released: 05/11/2009 Document Revised: 12/21/2015 Document Reviewed: 08/19/2014 Elsevier Interactive Patient Education  2017 [employer].

## 2018-02-24 NOTE — Progress Notes (Addendum)
Subjective:   [name] [name] is a 84 y.o. male who presents for Medicare Annual/Subsequent preventive examination at [facility]  Last AWV-02/20/2017    Objective:    Vitals: BP 122/68 (BP Location: Right Arm, Patient Position: Supine)   Pulse 72   Temp 97.8 F (36.6 C) (Oral)   Ht 5\' 11"  (1.803 m)   Wt 132 lb (59.9 kg)   BMI 18.41 kg/m   Body mass index is 18.41 kg/m.  Advanced Directives 02/24/2018 02/20/2017 12/04/2016 10/30/2016 09/27/2016 08/30/2016 07/03/2016  Does Patient Have a Medical Advance Directive? No Yes No No No No No  Type of Advance Directive - Out of facility DNR (pink MOST or yellow form) - - - - -  Does patient want to make changes to medical advance directive? - No - Patient declined - - - - -  Would patient like information on creating a medical advance directive? No - Patient declined - - - - - -  Pre-existing out of facility DNR order (yellow form or pink MOST form) - Pink MOST form placed in chart (order not valid for inpatient use);Yellow form placed in chart (order not valid for inpatient use) - - - - -    Tobacco Social History   Tobacco Use  Smoking Status Former Smoker  . Packs/day: 0.25  . Years: 10.00  . Pack years: 2.50  . Types: Cigarettes  Smokeless Tobacco Never Used     Counseling given: Not Answered   Clinical Intake:  Pre-visit preparation completed: No  Pain : No/denies pain     Nutritional Risks: None Diabetes: No  How often do you need to have someone help you when you read instructions, pamphlets, or other written materials from your doctor or pharmacy?: 3 - Sometimes  Interpreter Needed?: No  Information entered by :: Tyson Dense, RN  Past Medical History:  Diagnosis Date  . 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 Cancer ([place])   . Chronic kidney disease   . Dementia   . Dysphagia   . Fall   . Hypertension   . Rhabdomyolysis   . Symbolic dysfunction    Social impairment    History reviewed. No pertinent surgical history. History reviewed. No pertinent family history. Social History   Socioeconomic History  . Marital status: Single    Spouse name: Not on file  . Number of children: Not on file  . Years of education: Not on file  . Highest education level: Not on file  Occupational History  . Not on file  Social Needs  . Financial resource strain: Not hard at all  . Food insecurity:    Worry: Never true    Inability: Never true  . Transportation needs:    Medical: No    Non-medical: No  Tobacco Use  . Smoking status: Former Smoker    Packs/day: 0.25    Years: 10.00    Pack years: 2.50    Types: Cigarettes  . Smokeless tobacco: Never Used  Substance and Sexual Activity  . Alcohol use: No  . Drug use: No  . Sexual activity: Never  Lifestyle  . Physical activity:    Days per week: 0 days    Minutes per session: 0 min  . Stress: Only a little  Relationships  . Social connections:    Talks on phone: Never    Gets together: Never    Attends religious service: Never    Active member  of club or organization: No    Attends meetings of clubs or organizations: Never    Relationship status: Never married  Other Topics Concern  . Not on file  Social History Narrative   Long-term care resident at [facility].    Outpatient Encounter Medications as of 02/24/2018  Medication Sig  . acetaminophen (TYLENOL) 325 MG tablet Take 650 mg by mouth 2 (two) times daily as needed for mild pain, moderate pain or fever.   . Amino Acids-Protein Hydrolys (FEEDING SUPPLEMENT, PRO-STAT SUGAR FREE 64,) LIQD Take 30 mLs by mouth daily.  . Menthol, Topical Analgesic, (BIOFREEZE EX) Apply 1 application topically 3 (three) times daily. Apply to right knee and right hip each shift for pain  . Nutritional Supplements (NUTRITIONAL SUPPLEMENT PO) Take 1 each by mouth 2 (two) times daily. Magic Cup  . simvastatin (ZOCOR) 10 MG tablet Take 1 tablet  (10 mg total) by mouth daily.  . vitamin B-12 (CYANOCOBALAMIN) 500 MCG tablet Take 500 mcg by mouth daily.   No facility-administered encounter medications on file as of 02/24/2018.     Activities of Daily Living In your present state of health, do you have any difficulty performing the following activities: 02/24/2018  Hearing? Y  Vision? N  Difficulty concentrating or making decisions? Y  Walking or climbing stairs? Y  Dressing or bathing? Y  Doing errands, shopping? Y  Preparing Food and eating ? Y  Using the Toilet? Y  In the past six months, have you accidently leaked urine? Y  Do you have problems with loss of bowel control? Y  Managing your Medications? Y  Managing your Finances? Y  Housekeeping or managing your Housekeeping? Y  Some recent data might be hidden    Patient Care Team: [name], MD as PCP - General (Internal Medicine) Rehab, [place] ([place])   Assessment:   This is a routine wellness examination for [name].  Exercise Activities and Dietary recommendations Current Exercise Habits: The patient does not participate in regular exercise at present, Exercise limited by: orthopedic condition(s)  Goals    None      Fall Risk Fall Risk  02/24/2018 02/20/2017 08/30/2016 04/12/2016 03/22/2016  Falls in the past year? No No No No No  Risk for fall due to : - - - - -   Is the patient's home free of loose throw rugs in walkways, pet beds, electrical cords, etc?   yes      Grab bars in the bathroom? yes      Handrails on the stairs?   yes      Adequate lighting?   yes  Depression Screen PHQ 2/9 Scores 02/24/2018 02/20/2017 05/07/2012 11/20/2011  PHQ - 2 Score 0 0 0 0    Cognitive Function     6CIT Screen 02/24/2018 02/20/2017  What Year? 0 points 0 points  What month? 0 points 0 points  What time? 0 points 0 points  Count back from 20 4 points 4 points  Months in reverse 4 points 4 points  Repeat phrase 10 points 10  points  Total Score 18 18    Immunization History  Administered Date(s) Administered  . Influenza Split 05/08/2011, 05/07/2012  . Influenza Whole 04/22/2008, 05/25/2009  . Influenza-Unspecified 05/10/2015, 05/02/2016, 05/15/2017  . PPD Test 12/15/2013, 01/16/2015, 02/15/2015  . Pneumococcal Polysaccharide-23 04/10/2010, 12/15/2013  . Td 04/10/2010    Qualifies for Shingles Vaccine? Not in past records  Screening Tests Health Maintenance  Topic Date Due  . PNA vac Low Risk Adult (2 of 2 - PCV13) 10/13/2043 (Originally 12/16/2014)  . INFLUENZA VACCINE  02/26/2018  . TETANUS/TDAP  04/10/2020   Cancer Screenings: Lung: Low Dose CT Chest recommended if Age 26-80 years, 30 pack-year currently smoking OR have quit w/in 15years. Patient does not qualify. Colorectal: excluded  Additional Screenings:  Hepatitis C Screening: declined  Prevnar due: ordered, Patient does not remember if he received it in the past    Plan:    I have personally reviewed and addressed the Medicare Annual Wellness questionnaire and have noted the following in the patient's chart:  A. Medical and social history B. Use of alcohol, tobacco or illicit drugs  C. Current medications and supplements D. Functional ability and status E.  Nutritional status F.  Physical activity G. Advance directives H. List of other physicians I.  Hospitalizations, surgeries, and ER visits in previous 12 months J.  [place] to include hearing, vision, cognitive, depression L. Referrals and appointments - none  In addition, I have reviewed and discussed with patient certain preventive protocols, quality metrics, and best practice recommendations. A written personalized care plan for preventive services as well as general preventive health recommendations were provided to patient.  See attached scanned questionnaire for additional information.   Signed,   Tyson Dense, RN Nurse Health Advisor  Patient Concerns:  None

## 2018-03-05 DIAGNOSIS — S51001A Unspecified open wound of right elbow, initial encounter: Secondary | ICD-10-CM | POA: Diagnosis not present

## 2018-03-05 DIAGNOSIS — S51001D Unspecified open wound of right elbow, subsequent encounter: Secondary | ICD-10-CM | POA: Diagnosis not present

## 2018-03-09 DIAGNOSIS — S51001A Unspecified open wound of right elbow, initial encounter: Secondary | ICD-10-CM | POA: Diagnosis not present

## 2018-03-09 DIAGNOSIS — S51001D Unspecified open wound of right elbow, subsequent encounter: Secondary | ICD-10-CM | POA: Diagnosis not present

## 2018-03-10 ENCOUNTER — Non-Acute Institutional Stay (SKILLED_NURSING_FACILITY): Payer: Medicare Other | Admitting: Internal Medicine

## 2018-03-10 ENCOUNTER — Encounter: Payer: Self-pay | Admitting: Internal Medicine

## 2018-03-10 DIAGNOSIS — S51001S Unspecified open wound of right elbow, sequela: Secondary | ICD-10-CM | POA: Diagnosis not present

## 2018-03-10 DIAGNOSIS — F0390 Unspecified dementia without behavioral disturbance: Secondary | ICD-10-CM | POA: Diagnosis not present

## 2018-03-10 DIAGNOSIS — M24521 Contracture, right elbow: Secondary | ICD-10-CM

## 2018-03-10 NOTE — Patient Instructions (Signed)
See assessment and plan under each diagnosis in the problem list and acutely for this visit 

## 2018-03-10 NOTE — Assessment & Plan Note (Signed)
B12 deficiency has been corrected 03/10/2018 he gave the date as 03/08/2018; he was confused about his elbow issues

## 2018-03-10 NOTE — Progress Notes (Signed)
NURSING HOME LOCATION:  [facility] ROOM NUMBER:  [id number]  CODE STATUS:  Full Code  PCP:  [name], MD  [place] [address]  This is a nursing facility follow up of chronic medical diagnoses.  Interim medical record and care since last [place] visit was updated with review of diagnostic studies and change in clinical status since last visit were documented.  HPI: He is a permanent resident of the SNF with medical diagnoses of adult failure to thrive, stroke complicated by dysphagia and dementia, dyslipidemia, protein/caloric malnutrition, chronic anemia with B12 deficiency, CKD stage III, hx prostate cancer, and essential hypertension. On 12/15/2017 creatinine was 0.5 which is reflective of his protein/caloric malnutrition rather than good renal function.  B12 deficiency has been corrected,it is now supranormal with a value of 1697.  Review of systems: Date given as 03/08/2018.  When he was asked how he was doing, his reply was "hanging in there". His main complaint is pain in the right elbow and right knee.  He stated he injured his elbow moving in bed w/o bedrails, using elbow as fulcrum.PT/OT has applied a brace to decrease lection contracture of the right upper extremity. Time wearing the brace is being titrated. He is presently wearing it for up to 4 hours at a time.  He is also has a pressure wound on the right elbow which the Wound Care Nurse is treating.. He specifically denies any cardiac, pulmonary, GI, or GU problems.  Constitutional: No fever, significant weight change, fatigue  Eyes: No redness, discharge, pain, vision change ENT/mouth: No nasal congestion,  purulent discharge, earache, change in hearing, sore throat  Cardiovascular: No chest pain, palpitations, paroxysmal nocturnal dyspnea, claudication, edema  Respiratory: No cough, sputum production, hemoptysis, DOE, significant snoring, apnea   Gastrointestinal: No heartburn,  dysphagia, abdominal pain, nausea /vomiting, rectal bleeding, melena, change in bowels Genitourinary: No dysuria, hematuria, pyuria, incontinence, nocturia Dermatologic: No rash, pruritus, change in appearance of skin Neurologic: No dizziness, headache, syncope, seizures, numbness, tingling Psychiatric: No significant anxiety, depression, insomnia, anorexia Endocrine: No change in hair/skin/nails, excessive thirst, excessive hunger, excessive urination  Hematologic/lymphatic: No significant bruising, lymphadenopathy, abnormal bleeding Allergy/immunology: No itchy/watery eyes, significant sneezing, urticaria, angioedema  Physical exam:  Pertinent or positive findings: His head is shaven and he appears grossly cachectic.  He has arcus senilis.  He is edentulous.  Heart sounds are distant.  Breath sounds are decreased.  Pulses are decreased.  He exhibits diffuse muscular wasting, especially of the hands and temples.  Right upper extremity is in a brace.  He has decreased range of motion of the right upper extremity.  He is diffusely weak in the other limbs.  Fusiform changes of the knees are present.  No effusion is suggested.  He has irregular,hyperpigmented scarring over the shins.  General appearance:  no acute distress, increased work of breathing is present.   Lymphatic: No lymphadenopathy about the head, neck, axilla. Eyes: No conjunctival inflammation or lid edema is present. There is no scleral icterus. Ears:  External ear exam shows no significant lesions or deformities.   Nose:  External nasal examination shows no deformity or inflammation. Nasal mucosa are pink and moist without lesions, exudates Oral exam:  Lips and gums are healthy appearing. There is no oropharyngeal erythema or exudate. Neck:  No thyromegaly, masses, tenderness noted.    Heart:  Normal rate and regular rhythm. S1 and S2 normal without gallop, murmur, click, rub .  Lungs:  Chest clear to auscultation without wheezes,  rhonchi, rales, rubs. Abdomen: Bowel sounds are normal. Abdomen is soft and nontender with no organomegaly, hernias, masses. GU: Deferred  Extremities:  No cyanosis, clubbing, edema  Neurologic exam : Balance, Rhomberg, finger to nose testing could not be completed due to clinical state Skin: Warm & dry w/o tenting. No significant rash.  See summary under each active problem in the Problem List with associated updated therapeutic plan

## 2018-03-10 NOTE — Assessment & Plan Note (Addendum)
Caused by him using elbow to change position in bed Wound Care Nurse treating actively

## 2018-03-11 ENCOUNTER — Encounter: Payer: Self-pay | Admitting: Internal Medicine

## 2018-03-16 DIAGNOSIS — S51001D Unspecified open wound of right elbow, subsequent encounter: Secondary | ICD-10-CM | POA: Diagnosis not present

## 2018-03-16 DIAGNOSIS — S51001A Unspecified open wound of right elbow, initial encounter: Secondary | ICD-10-CM | POA: Diagnosis not present

## 2018-03-17 DIAGNOSIS — I739 Peripheral vascular disease, unspecified: Secondary | ICD-10-CM | POA: Diagnosis not present

## 2018-03-17 DIAGNOSIS — B351 Tinea unguium: Secondary | ICD-10-CM | POA: Diagnosis not present

## 2018-03-23 DIAGNOSIS — S51001D Unspecified open wound of right elbow, subsequent encounter: Secondary | ICD-10-CM | POA: Diagnosis not present

## 2018-03-23 DIAGNOSIS — S51001A Unspecified open wound of right elbow, initial encounter: Secondary | ICD-10-CM | POA: Diagnosis not present

## 2018-04-06 DIAGNOSIS — S51001D Unspecified open wound of right elbow, subsequent encounter: Secondary | ICD-10-CM | POA: Diagnosis not present

## 2018-04-06 DIAGNOSIS — S51001A Unspecified open wound of right elbow, initial encounter: Secondary | ICD-10-CM | POA: Diagnosis not present

## 2018-04-13 DIAGNOSIS — S51001D Unspecified open wound of right elbow, subsequent encounter: Secondary | ICD-10-CM | POA: Diagnosis not present

## 2018-04-13 DIAGNOSIS — S51001A Unspecified open wound of right elbow, initial encounter: Secondary | ICD-10-CM | POA: Diagnosis not present

## 2018-04-20 DIAGNOSIS — S51001D Unspecified open wound of right elbow, subsequent encounter: Secondary | ICD-10-CM | POA: Diagnosis not present

## 2018-04-20 DIAGNOSIS — S51001A Unspecified open wound of right elbow, initial encounter: Secondary | ICD-10-CM | POA: Diagnosis not present

## 2018-04-27 ENCOUNTER — Non-Acute Institutional Stay (SKILLED_NURSING_FACILITY): Payer: Medicare Other | Admitting: Adult Health

## 2018-04-27 ENCOUNTER — Encounter: Payer: Self-pay | Admitting: Adult Health

## 2018-04-27 DIAGNOSIS — E785 Hyperlipidemia, unspecified: Secondary | ICD-10-CM | POA: Diagnosis not present

## 2018-04-27 DIAGNOSIS — R627 Adult failure to thrive: Secondary | ICD-10-CM

## 2018-04-27 DIAGNOSIS — E538 Deficiency of other specified B group vitamins: Secondary | ICD-10-CM | POA: Diagnosis not present

## 2018-04-27 DIAGNOSIS — M1711 Unilateral primary osteoarthritis, right knee: Secondary | ICD-10-CM

## 2018-04-27 DIAGNOSIS — S51001A Unspecified open wound of right elbow, initial encounter: Secondary | ICD-10-CM | POA: Diagnosis not present

## 2018-04-27 DIAGNOSIS — S51001D Unspecified open wound of right elbow, subsequent encounter: Secondary | ICD-10-CM | POA: Diagnosis not present

## 2018-04-27 NOTE — Progress Notes (Signed)
Location:  [facility] Room Number: 967-E Place of Service:  SNF (31) Provider:  Durenda Age, NP  Patient Care Team: [name], MD as PCP - General (Internal Medicine) Rehab, [place] ([place])  Extended Emergency Contact Information Primary Emergency Contact: Savo,[name] Address: [place], [place] [address] [name] of [place] Phone: [phone] Relation: Daughter  Code Status:  Full Code  Goals of care: Advanced Directive information Advanced Directives 02/24/2018  Does Patient Have a Medical Advance Directive? No  Type of Advance Directive -  Does patient want to make changes to medical advance directive? -  Would patient like information on creating a medical advance directive? No - Patient declined  Pre-existing out of facility DNR order (yellow form or pink MOST form) -     Chief Complaint  Patient presents with  . Medical Management of Chronic Issues    The patient is seen for a routine [facility] visit    HPI:  Pt is an 84 y.o. male seen today for medical management of chronic diseases.  He is a long-term care resident of [facility].  He has a PMH of B12 deficiency, CKD, HTN, HLD, CVA, OA, prostate cancer, and dementia. He was seen in the room today. He verbalized that his right knee sometimes hurt and gets relief from the biofreeze gel.   Past Medical History:  Diagnosis Date  . 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 Cancer ([place])   . Chronic kidney disease   . Dementia   . Dysphagia   . Fall   . Hypertension   . Rhabdomyolysis   . Symbolic dysfunction    Social impairment   History reviewed. No pertinent surgical history.  No Known Allergies  Outpatient Encounter Medications as of 04/27/2018  Medication Sig  . acetaminophen (TYLENOL) 325 MG tablet Take 650 mg by mouth 2 (two) times daily as needed for  mild pain, moderate pain or fever.   . Menthol, Topical Analgesic, (BIOFREEZE EX) Apply 1 application topically 3 (three) times daily. Apply to right knee and right hip each shift for pain  . Nutritional Supplements (NUTRITIONAL SUPPLEMENT PO) Take 1 each by mouth 2 (two) times daily. Magic Cup  . simvastatin (ZOCOR) 10 MG tablet Take 1 tablet (10 mg total) by mouth daily.  . vitamin B-12 (CYANOCOBALAMIN) 500 MCG tablet Take 500 mcg by mouth daily.   No facility-administered encounter medications on file as of 04/27/2018.     Review of Systems  GENERAL: No change in appetite, no fatigue, no weight changes, no fever, chills or weakness MOUTH and THROAT: Denies oral discomfort, gingival pain or bleeding, pain from teeth or hoarseness   RESPIRATORY: no cough, SOB, DOE, wheezing, hemoptysis CARDIAC: No chest pain, edema or palpitations GI: No abdominal pain, diarrhea, constipation, heart burn, nausea or vomiting GU: Denies dysuria, frequency, hematuria, incontinence, or discharge MUSCULOSKELETAL: + right knee pain occasionally PSYCHIATRIC: Denies feelings of depression or anxiety. No report of hallucinations, insomnia, paranoia, or agitation    Immunization History  Administered Date(s) Administered  . Influenza Split 05/08/2011, 05/07/2012  . Influenza Whole 04/22/2008, 05/25/2009  . Influenza-Unspecified 05/10/2015, 05/02/2016, 05/15/2017  . PPD Test 12/15/2013, 01/16/2015, 02/15/2015  . Pneumococcal Polysaccharide-23 04/10/2010, 12/15/2013  . Td 04/10/2010   Pertinent  Health Maintenance Due  Topic Date Due  . INFLUENZA VACCINE  02/26/2018  . PNA  vac Low Risk Adult (2 of 2 - PCV13) 10/13/2043 (Originally 12/16/2014)   Fall Risk  02/24/2018 02/20/2017 08/30/2016 04/12/2016 03/22/2016  Falls in the past year? No No No No No  Risk for fall due to : - - - - -      Vitals:   04/27/18 1054  BP: 126/72  Pulse: 68  Resp: 18  Temp: 97.6 F (36.4 C)  TempSrc: Oral  SpO2: 96%  Weight:  138 lb 9.6 oz (62.9 kg)  Height: 5\' 11"  (1.803 m)   Body mass index is 19.33 kg/m.  Physical Exam  GENERAL APPEARANCE:  In no acute distress.  SKIN:  Skin is warm and dry.  MOUTH and THROAT: Lips are without lesions. Oral mucosa is moist and without lesions. Tongue is normal in shape, size, and color and without lesions RESPIRATORY: Breathing is even & unlabored, BS CTAB CARDIAC: RRR, no murmur,no extra heart sounds, no edema GI: Abdomen soft, normal BS, no masses, no tenderness EXTREMITIES:  Able to move X 4 extremities, BLE has generalized weakness NEUROLOGICAL: There is no tremor. Speech is clear PSYCHIATRIC: Alert and oriented X 3. Affect and behavior are appropriate  Labs reviewed: Recent Labs    08/15/17 11/28/17  NA 140 139  K 4.4 4.2  BUN 15 17  CREATININE 0.5* 0.5*   Recent Labs    08/15/17 11/28/17  AST 15 12*  ALT 8* 5*  ALKPHOS 60 79   Recent Labs    08/15/17  WBC 7.2  NEUTROABS 4  HGB 13.3*  HCT 41  PLT 200   Lab Results  Component Value Date   TSH 4.43 10/29/2017    Lab Results  Component Value Date   CHOL 136 03/26/2017   HDL 34 (A) 03/26/2017   LDLCALC 90 03/26/2017   TRIG 62 03/26/2017   CHOLHDL 5.3 09/10/2013    Assessment/Plan  1. Osteoarthritis of right knee, unspecified osteoarthritis type - complains of occasional right knee pain, continue Biofreeze 4% gel topically to right knee every shift   2. Failure to thrive in adult - Body mass index is 19.33 kg/m.  Continue Magic cup twice a day  Last Weight  Most recent update: 04/27/2018 10:59 AM   Weight  62.9 kg (138 lb 9.6 oz)            3. Vitamin B12 deficiency -continue vitamin B12 500 mcg 1 tab daily   4. Hyperlipidemia, unspecified hyperlipidemia type -continue simvastatin 10 mg 1 tab nightly Lab Results  Component Value Date   CHOL 136 03/26/2017   HDL 34 (A) 03/26/2017   LDLCALC 90 03/26/2017   TRIG 62 03/26/2017   CHOLHDL 5.3 09/10/2013      Family/ staff  Communication:  Discussed plan of care with resident.  Labs/tests ordered:  None  Goals of care:   Long-term care.   Durenda Age, NP [facility] [phone] (Monday-Friday 8:00 a.m. - 5:00 p.m.) [phone] (after hours)

## 2018-05-26 ENCOUNTER — Encounter: Payer: Self-pay | Admitting: Internal Medicine

## 2018-05-26 ENCOUNTER — Non-Acute Institutional Stay (SKILLED_NURSING_FACILITY): Payer: Medicare Other | Admitting: Internal Medicine

## 2018-05-26 DIAGNOSIS — IMO0002 Reserved for concepts with insufficient information to code with codable children: Secondary | ICD-10-CM

## 2018-05-26 DIAGNOSIS — E785 Hyperlipidemia, unspecified: Secondary | ICD-10-CM

## 2018-05-26 DIAGNOSIS — E538 Deficiency of other specified B group vitamins: Secondary | ICD-10-CM | POA: Diagnosis not present

## 2018-05-26 DIAGNOSIS — M1710 Unilateral primary osteoarthritis, unspecified knee: Secondary | ICD-10-CM

## 2018-05-26 NOTE — Progress Notes (Signed)
    NURSING HOME LOCATION:  [facility] ROOM NUMBER:  [id number]  CODE STATUS:  Full Code  PCP:  [name], MD  [place] [address]  This is a nursing facility follow up of chronic medical diagnoses.  Interim medical record and care since last [place] visit was updated with review of diagnostic studies and change in clinical status since last visit were documented.  HPI: He is a permanent resident of the SNF with diagnoses of coronary disease, status post MI, history of stroke complicated by dysphagia, dyslipidemia, protein/caloric malnutrition, CKD stage III, essential hypertension, history of prostate cancer, and anemia with B12 deficiency.  Review of systems: The patient gave the date as the "10th month, 28th day, 2019". He states that he is "sometimes short of breath".  His major symptomatology consists of pain in his knees.  He states that he cannot walk because "I had a heart attack in the bathroom and was on the floor too long". PMH includes rhabdomyolysis.  No chest pain, palpitations, paroxysmal nocturnal dyspnea, claudication, edema  Respiratory: No cough, sputum production, hemoptysis, significant snoring, apnea   Gastrointestinal: No heartburn, dysphagia, abdominal pain, nausea /vomiting, rectal bleeding, melena, change in bowels Genitourinary: No dysuria, hematuria, pyuria, incontinence, nocturia No significant anxiety, depression, insomnia, anorexia Hematologic/lymphatic: No significant bruising, lymphadenopathy, abnormal bleeding  Physical exam:  Pertinent or positive findings: He does appear cachectic.  Pattern alopecia is present.  He is slightly hard of hearing.  Arcus senilis is noted. Facies is a pattern of being wide-eyed with mouth agape as if alarmed.  He is edentulous.  Heart sounds are distant.  He has minimal isolated rhonchi.  He does have some fusiform change of the knees without effusion.  Limbs are atrophic.  He is weaker in  the left upper extremity than the right upper extremity.  Weakness is symmetric in the lower extremities.  Posterior tibial pulses are decreased.  His feet are in foam booties.  He has hyperpigmented scarring over the shins.  Toenails are thickened and deformed.  He has flexion contractures of the right hand. General appearance: Adequately nourished; no acute distress, increased work of breathing is present.   Lymphatic: No lymphadenopathy about the head, neck, axilla. Eyes: No conjunctival inflammation or lid edema is present. There is no scleral icterus. Ears:  External ear exam shows no significant lesions or deformities.   Nose:  External nasal examination shows no deformity or inflammation. Nasal mucosa are pink and moist without lesions, exudates Oral exam:  Lips and gums are healthy appearing. There is no oropharyngeal erythema or exudate. Neck:  No thyromegaly, masses, tenderness noted.    Heart:  Normal rate and regular rhythm. S1 and S2 normal without gallop, murmur, click, rub .  Lungs:  without wheezes, rales, rubs. Abdomen: Bowel sounds are normal. Abdomen is soft and nontender with no organomegaly, hernias, masses. GU: Deferred  Extremities:  No cyanosis, clubbing, edema  Neurologic exam : Balance, Rhomberg, finger to nose testing could not be completed due to clinical state Skin: Warm & dry w/o tenting. No significant  rash.  See summary under each active problem in the Problem List with associated updated therapeutic plan

## 2018-05-26 NOTE — Assessment & Plan Note (Addendum)
He is on low-dose simvastatin, lipids are due if we are to continue the statin as secondary prevention. He has PMH of MI & CVA. LFTs are current and have been actually low

## 2018-05-26 NOTE — Assessment & Plan Note (Addendum)
B12 deficiency has been corrected based on current labs

## 2018-05-26 NOTE — Assessment & Plan Note (Addendum)
He appears to get adequate relief from Tylenol and the topical Biofreeze.  His knees appeared to be fixed in partial flexion.  No effusions  present.  No other interventions appropriate as he is bedridden and nonambulatory. Clinically no findings to suggest metastatic prostate cancer.

## 2018-05-26 NOTE — Patient Instructions (Signed)
See assessment and plan under each diagnosis in the problem list and acutely for this visit 

## 2018-05-27 ENCOUNTER — Encounter: Payer: Self-pay | Admitting: Internal Medicine

## 2018-05-27 DIAGNOSIS — E785 Hyperlipidemia, unspecified: Secondary | ICD-10-CM | POA: Diagnosis not present

## 2018-05-27 DIAGNOSIS — Z79899 Other long term (current) drug therapy: Secondary | ICD-10-CM | POA: Diagnosis not present

## 2018-05-27 LAB — LIPID PANEL
Cholesterol: 135 (ref 0–200)
HDL: 33 — AB (ref 35–70)
LDL Cholesterol: 89
LDl/HDL Ratio: 4.1
Triglycerides: 68 (ref 40–160)

## 2018-06-23 ENCOUNTER — Non-Acute Institutional Stay (SKILLED_NURSING_FACILITY): Payer: Medicare Other | Admitting: Adult Health

## 2018-06-23 ENCOUNTER — Encounter: Payer: Self-pay | Admitting: Adult Health

## 2018-06-23 DIAGNOSIS — M1710 Unilateral primary osteoarthritis, unspecified knee: Secondary | ICD-10-CM

## 2018-06-23 DIAGNOSIS — E7849 Other hyperlipidemia: Secondary | ICD-10-CM | POA: Diagnosis not present

## 2018-06-23 DIAGNOSIS — E538 Deficiency of other specified B group vitamins: Secondary | ICD-10-CM | POA: Diagnosis not present

## 2018-06-23 DIAGNOSIS — IMO0002 Reserved for concepts with insufficient information to code with codable children: Secondary | ICD-10-CM

## 2018-06-23 NOTE — Progress Notes (Signed)
Location:  [place] Room Number: 326-Z Place of Service:  SNF (31) Provider:  Durenda Age, NP  Patient Care Team: [name], MD as PCP - General (Internal Medicine) Rehab, [place] ([place])  Extended Emergency Contact Information Primary Emergency Contact: Benning,[name] Address: [place], [place] [address] [name] of [place] Phone: [phone] Relation: Daughter  Code Status:  Full Code  Goals of care: Advanced Directive information Advanced Directives 02/24/2018  Does Patient Have a Medical Advance Directive? No  Type of Advance Directive -  Does patient want to make changes to medical advance directive? -  Would patient like information on creating a medical advance directive? No - Patient declined  Pre-existing out of facility DNR order (yellow form or pink MOST form) -     Chief Complaint  Patient presents with  . Medical Management of Chronic Issues    Routine [facility] visit    HPI:  Pt is an 84 y.o. male seen today for medical management of chronic diseases.  He is a long-term care resident of [facility].  He has a PMH of CKD, HTN, HLD, CVA, B12 deficiency, prostate cancer, and dementia. He was seen in his room today. Latest weight is 136.4 lbs -stable Body mass index is 19.02 kg/m. He said that he eats what he wants to eat and thinks that he has a good appetite.     Past Medical History:  Diagnosis Date  . 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 Cancer ([place])   . Chronic kidney disease   . Dementia ([place])   . Dysphagia   . Fall   . Hypertension   . Rhabdomyolysis   . Symbolic dysfunction    Social impairment   History reviewed. No pertinent surgical history.  No Known Allergies  Outpatient Encounter Medications as of 06/23/2018  Medication Sig  . acetaminophen (TYLENOL) 325 MG tablet Take 650 mg by  mouth 2 (two) times daily as needed for mild pain, moderate pain or fever.   . Menthol, Topical Analgesic, (BIOFREEZE EX) Apply 1 application topically 3 (three) times daily. Apply to right knee and right hip each shift for pain  . NUTRITIONAL SUPPLEMENT LIQD Take 120 mLs by mouth 2 (two) times daily. MedPass  . Nutritional Supplements (NUTRITIONAL SUPPLEMENT PO) Take 1 each by mouth 2 (two) times daily. Magic Cup  . simvastatin (ZOCOR) 20 MG tablet Take 20 mg by mouth daily.  . vitamin B-12 (CYANOCOBALAMIN) 500 MCG tablet Take 500 mcg by mouth daily.  . [DISCONTINUED] simvastatin (ZOCOR) 10 MG tablet Take 1 tablet (10 mg total) by mouth daily.   No facility-administered encounter medications on file as of 06/23/2018.     Review of Systems  GENERAL: No change in appetite, no fatigue, no weight changes, no fever, chills or weakness MOUTH and THROAT: Denies oral discomfort, gingival pain or bleeding, pain from teeth or hoarseness   RESPIRATORY: no cough, SOB, DOE, wheezing, hemoptysis CARDIAC: No chest pain, edema or palpitations GI: No abdominal pain, diarrhea, constipation, heart burn, nausea or vomiting PSYCHIATRIC: Denies feelings of depression or anxiety. No report of hallucinations, insomnia, paranoia, or agitation    Immunization History  Administered Date(s) Administered  . Influenza Split 05/08/2011, 05/07/2012  . Influenza Whole 04/22/2008, 05/25/2009  . Influenza-Unspecified 05/02/2016, 05/15/2017, 06/01/2018  . PPD Test 12/15/2013, 01/16/2015, 02/15/2015  . Pneumococcal Polysaccharide-23  04/10/2010, 12/15/2013  . Td 04/10/2010   Pertinent  Health Maintenance Due  Topic Date Due  . PNA vac Low Risk Adult (2 of 2 - PCV13) 10/13/2043 (Originally 12/16/2014)  . INFLUENZA VACCINE  Completed   Fall Risk  02/24/2018 02/20/2017 08/30/2016 04/12/2016 03/22/2016  Falls in the past year? No No No No No  Risk for fall due to : - - - - -      Vitals:   06/23/18 1157  BP: (!) 102/59    Pulse: 70  Resp: 17  Temp: 98.9 F (37.2 C)  TempSrc: Oral  SpO2: 97%  Weight: 136 lb 6.4 oz (61.9 kg)  Height: 5\' 11"  (1.803 m)   Body mass index is 19.02 kg/m.  Physical Exam  GENERAL APPEARANCE:  In no acute distress.  SKIN:  Skin is warm and dry.  MOUTH and THROAT: Lips are without lesions. Oral mucosa is moist and without lesions.  RESPIRATORY: Breathing is even & unlabored, BS CTAB CARDIAC: RRR, no murmur,no extra heart sounds, no edema GI: Abdomen soft, normal BS, no masses, no tenderness EXTREMITIES:  Able to move BUE, has generalized weakness BLE NEUROLOGICAL: There is no tremor. Speech is clear. Alert and oriented X 3. PSYCHIATRIC:  Affect and behavior are appropriate   Labs reviewed: Recent Labs    08/15/17 11/28/17  NA 140 139  K 4.4 4.2  BUN 15 17  CREATININE 0.5* 0.5*   Recent Labs    08/15/17 11/28/17  AST 15 12*  ALT 8* 5*  ALKPHOS 60 79   Recent Labs    08/15/17  WBC 7.2  NEUTROABS 4  HGB 13.3*  HCT 41  PLT 200   Lab Results  Component Value Date   TSH 4.43 10/29/2017    Lab Results  Component Value Date   CHOL 135 05/27/2018   HDL 33 (A) 05/27/2018   LDLCALC 89 05/27/2018   TRIG 68 05/27/2018   CHOLHDL 5.3 09/10/2013    Assessment/Plan  1. Other hyperlipidemia -continue simvastatin 20 mg 1 tab bedtime Lab Results  Component Value Date   CHOL 135 05/27/2018   HDL 33 (A) 05/27/2018   LDLCALC 89 05/27/2018   TRIG 68 05/27/2018   CHOLHDL 5.3 09/10/2013      2. Vitamin B12 deficiency -continue vitamin B12 500 mcg 1 tab daily Lab Results  Component Value Date   VITAMINB12 1,697 11/28/2017    3. Osteoarthrosis involving lower leg -stable, continue Biofreeze 4% gel apply topically to right knee every shift and acetaminophen 325 mg 2 tabs = 650 mg twice a day    Family/ staff Communication:  Discussed plan of care with resident.   Labs/tests ordered:  None  Goals of care:   Long-term care.   Durenda Age,  NP [facility] Adult [facility] [phone] (Monday-Friday 8:00 a.m. - 5:00 p.m.) [phone] (after hours)

## 2018-07-14 ENCOUNTER — Non-Acute Institutional Stay (SKILLED_NURSING_FACILITY): Payer: Medicare Other | Admitting: Adult Health

## 2018-07-14 ENCOUNTER — Encounter: Payer: Self-pay | Admitting: Adult Health

## 2018-07-14 DIAGNOSIS — F0150 Vascular dementia without behavioral disturbance: Secondary | ICD-10-CM

## 2018-07-14 DIAGNOSIS — E7849 Other hyperlipidemia: Secondary | ICD-10-CM | POA: Diagnosis not present

## 2018-07-14 DIAGNOSIS — R627 Adult failure to thrive: Secondary | ICD-10-CM | POA: Diagnosis not present

## 2018-07-14 DIAGNOSIS — E538 Deficiency of other specified B group vitamins: Secondary | ICD-10-CM

## 2018-07-14 NOTE — Progress Notes (Signed)
Location:  [place] Room Number: 366-Q Place of Service:  SNF (31) Provider:  Durenda Age, NP  Patient Care Team: [name], MD as PCP - General (Internal Medicine) Rehab, [place] ([place])  Extended Emergency Contact Information Primary Emergency Contact: Swanner,[name] Address: [place], [place] [address] [name] of [place] Phone: [phone] Relation: Daughter  Code Status:  Full Code  Goals of care: Advanced Directive information Advanced Directives 02/24/2018  Does Patient Have a Medical Advance Directive? No  Type of Advance Directive -  Does patient want to make changes to medical advance directive? -  Would patient like information on creating a medical advance directive? No - Patient declined  Pre-existing out of facility DNR order (yellow form or pink MOST form) -     Chief Complaint  Patient presents with  . Medical Management of Chronic Issues    Routine [facility] visit    HPI:  Pt is a 84 y.o. male seen today for medical management of chronic diseases.  He is a long-term care resident of [facility].  He has a PMH of B12 deficiency, CKD, HTN, HLD, CVA, OA, prostate cancer, and dementia. He was seen in his room today. He denies any concerns. He had weight loss 2.6 lbs in a month. Latest weight is 133.6 lbs Body mass index is 18.63 kg/m.   Past Medical History:  Diagnosis Date  . 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 Cancer ([place])   . Chronic kidney disease   . Dementia ([place])   . Dysphagia   . Fall   . Hypertension   . Rhabdomyolysis   . Symbolic dysfunction    Social impairment   History reviewed. No pertinent surgical history.  No Known Allergies  Outpatient Encounter Medications as of 07/14/2018  Medication Sig  . acetaminophen (TYLENOL) 325 MG tablet Take 650 mg by mouth 2 (two) times daily as  needed for mild pain, moderate pain or fever.   . Menthol, Topical Analgesic, (BIOFREEZE EX) Apply 1 application topically 3 (three) times daily. Apply to right knee and right hip each shift for pain  . NUTRITIONAL SUPPLEMENT LIQD Take 120 mLs by mouth 2 (two) times daily. MedPass  . Nutritional Supplements (NUTRITIONAL SUPPLEMENT PO) Take 1 each by mouth 2 (two) times daily. Magic Cup  . simvastatin (ZOCOR) 20 MG tablet Take 20 mg by mouth daily.  . vitamin B-12 (CYANOCOBALAMIN) 500 MCG tablet Take 500 mcg by mouth daily.   No facility-administered encounter medications on file as of 07/14/2018.     Review of Systems  GENERAL: No change in appetite, no fatigue, no weight changes, no fever, chills or weakness MOUTH and THROAT: Denies oral discomfort, gingival pain or bleeding RESPIRATORY: no cough, SOB, DOE, wheezing, hemoptysis CARDIAC: No chest pain, edema or palpitations GI: No abdominal pain, diarrhea, constipation, heart burn, nausea or vomiting GU: Denies dysuria, frequency, hematuria, or discharge NEUROLOGICAL: Denies dizziness, syncope, numbness, or headache PSYCHIATRIC: Denies feelings of depression or anxiety. No report of hallucinations, insomnia, paranoia, or agitation   Immunization History  Administered Date(s) Administered  . Influenza Split 05/08/2011, 05/07/2012  . Influenza Whole 04/22/2008, 05/25/2009  . Influenza-Unspecified 05/02/2016, 05/15/2017, 06/01/2018  . PPD Test 12/15/2013, 01/16/2015, 02/15/2015  . Pneumococcal Polysaccharide-23 04/10/2010, 12/15/2013  . Td 04/10/2010   Pertinent  Health Maintenance Due  Topic Date Due  .  PNA vac Low Risk Adult (2 of 2 - PCV13) 10/13/2043 (Originally 12/16/2014)  . INFLUENZA VACCINE  Completed   Fall Risk  02/24/2018 02/20/2017 08/30/2016 04/12/2016 03/22/2016  Falls in the past year? No No No No No  Risk for fall due to : - - - - -   Physical Exam  GENERAL APPEARANCE:  In no acute distress.  SKIN:  Skin is warm and  dry.  MOUTH and THROAT: Lips are without lesions. Oral mucosa is moist and without lesions.  RESPIRATORY: Breathing is even & unlabored, BS CTAB CARDIAC: RRR, no murmur,no extra heart sounds, no edema GI: Abdomen soft, normal BS, no masses, no tenderness EXTREMITIES:  No cyanosis  NEUROLOGICAL: There is no tremor. Speech is clear.  Alert and oriented X 3. PSYCHIATRIC: Affect and behavior are appropriate  Labs reviewed: Recent Labs    08/15/17 11/28/17  NA 140 139  K 4.4 4.2  BUN 15 17  CREATININE 0.5* 0.5*   Recent Labs    08/15/17 11/28/17  AST 15 12*  ALT 8* 5*  ALKPHOS 60 79   Recent Labs    08/15/17  WBC 7.2  NEUTROABS 4  HGB 13.3*  HCT 41  PLT 200   Lab Results  Component Value Date   TSH 4.43 10/29/2017    Lab Results  Component Value Date   CHOL 135 05/27/2018   HDL 33 (A) 05/27/2018   LDLCALC 89 05/27/2018   TRIG 68 05/27/2018   CHOLHDL 5.3 09/10/2013     Assessment/Plan  1. Adult failure to thrive - Body mass index is 18.63 kg/m. will start Eldertonic 15 ml BID, continue super cereal at breakfast, magic cup BID, Medpass 120 ml BID   2. Other hyperlipidemia - continue Simvastatin 20 mg 1 tab Q HS   3. Vitamin B12 deficiency -  Continue Vitamin B12 500 mcg 1 tab daily   4. Vascular dementia without behavioral disturbance ([place]) - continue supportive care, fall precautions    Family/ staff Communication: Discussed plan of care with resident.   Labs/tests ordered:  None  Goals of care:   Long-term care.   Durenda Age, NP [facility] and Adult Medicine [phone] (Monday-Friday 8:00 a.m. - 5:00 p.m.) [phone] (after hours)

## 2018-07-20 DIAGNOSIS — I739 Peripheral vascular disease, unspecified: Secondary | ICD-10-CM | POA: Diagnosis not present

## 2018-07-20 DIAGNOSIS — B351 Tinea unguium: Secondary | ICD-10-CM | POA: Diagnosis not present

## 2018-08-04 DIAGNOSIS — M6281 Muscle weakness (generalized): Secondary | ICD-10-CM | POA: Diagnosis not present

## 2018-08-04 DIAGNOSIS — M6282 Rhabdomyolysis: Secondary | ICD-10-CM | POA: Diagnosis not present

## 2018-08-26 ENCOUNTER — Encounter: Payer: Self-pay | Admitting: Adult Health

## 2018-08-26 ENCOUNTER — Non-Acute Institutional Stay (SKILLED_NURSING_FACILITY): Payer: Medicare Other | Admitting: Adult Health

## 2018-08-26 DIAGNOSIS — F0150 Vascular dementia without behavioral disturbance: Secondary | ICD-10-CM

## 2018-08-26 DIAGNOSIS — Z7189 Other specified counseling: Secondary | ICD-10-CM | POA: Diagnosis not present

## 2018-08-26 DIAGNOSIS — E7849 Other hyperlipidemia: Secondary | ICD-10-CM

## 2018-08-26 DIAGNOSIS — M170 Bilateral primary osteoarthritis of knee: Secondary | ICD-10-CM | POA: Diagnosis not present

## 2018-08-26 DIAGNOSIS — R627 Adult failure to thrive: Secondary | ICD-10-CM | POA: Diagnosis not present

## 2018-08-26 DIAGNOSIS — E538 Deficiency of other specified B group vitamins: Secondary | ICD-10-CM | POA: Diagnosis not present

## 2018-08-26 NOTE — Progress Notes (Signed)
Location:  [place] Room Number: 948-N Place of Service:  SNF (31) Provider:  Durenda Age, NP  Patient Care Team: [name], MD as PCP - General (Internal Medicine) Rehab, [employer] ([place])  Extended Emergency Contact Information Primary Emergency Contact: Pendry,[name] Address: [place], [place] [address] [name] of [place] Phone: [phone] Relation: Daughter  Code Status:  Full Code  Goals of care: Advanced Directive information Advanced Directives 02/24/2018  Does Patient Have a Medical Advance Directive? No  Type of Advance Directive -  Does patient want to make changes to medical advance directive? -  Would patient like information on creating a medical advance directive? No - Patient declined  Pre-existing out of facility DNR order (yellow form or pink MOST form) -     Chief Complaint  Patient presents with  . Medical Management of Chronic Issues    Routine [facility] visit  . Advanced Directive    Care Plan Meeting    HPI:  Pt is an 85 y.o. male seen today for medical management of chronic diseases, as well as a care plan meeting.  He is a long-term care resident of [facility].  He has a PMH of B12 deficiency, CKD, HTN, HLD, CVA, OA, prostate cancer, and dementia. He was seen in his room today. He complained of both knees hurting, Encouraged to attend activities but he said that he prefers to stay in his room. Care plan meeting attended by NP, Social Worker, activity personal and NP student. Activity personnel reported that he likes to watch tv in his room and reading car magazines. He remains to be full code. Care plan meeting lasted for 16 minutes.   Past Medical History:  Diagnosis Date  . 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 Cancer ([place])   . Chronic kidney disease   . Dementia ([place])   . Dysphagia   .  Fall   . Hypertension   . Rhabdomyolysis   . Symbolic dysfunction    Social impairment   No past surgical history on file.  No Known Allergies  Outpatient Encounter Medications as of 08/26/2018  Medication Sig  . acetaminophen (TYLENOL) 325 MG tablet Take 650 mg by mouth 2 (two) times daily as needed for mild pain, moderate pain or fever.   Marland Kitchen geriatric multivitamins-minerals (ELDERTONIC/GEVRABON) LIQD Take 15 mLs by mouth 2 (two) times daily.  . Menthol, Topical Analgesic, (BIOFREEZE EX) Apply 1 application topically 3 (three) times daily. Apply to right knee and right hip each shift for pain  . NUTRITIONAL SUPPLEMENT LIQD Take 120 mLs by mouth 2 (two) times daily. MedPass  . Nutritional Supplements (NUTRITIONAL SUPPLEMENT PO) Take 1 each by mouth 2 (two) times daily. Magic Cup  . simvastatin (ZOCOR) 20 MG tablet Take 20 mg by mouth daily.  . vitamin B-12 (CYANOCOBALAMIN) 500 MCG tablet Take 500 mcg by mouth daily.   No facility-administered encounter medications on file as of 08/26/2018.     Review of Systems  GENERAL: No fever, chills or weakness MOUTH and THROAT: Denies oral discomfort, gingival pain or bleeding RESPIRATORY: no cough, SOB, DOE, wheezing, hemoptysis CARDIAC: No chest pain, edema or palpitations GI: No abdominal pain, diarrhea, constipation, heart burn, nausea or vomiting GU: Denies dysuria, frequency, hematuria, incontinence, or discharge PSYCHIATRIC: Denies feelings of depression or anxiety. No report of hallucinations, insomnia, paranoia, or  agitation   Immunization History  Administered Date(s) Administered  . Influenza Split 05/08/2011, 05/07/2012  . Influenza Whole 04/22/2008, 05/25/2009  . Influenza-Unspecified 05/02/2016, 05/15/2017, 06/01/2018  . PPD Test 12/15/2013, 01/16/2015, 02/15/2015  . Pneumococcal Polysaccharide-23 04/10/2010, 12/15/2013  . Td 04/10/2010   Pertinent  Health Maintenance Due  Topic Date Due  . PNA vac Low Risk Adult (2 of 2  - PCV13) 10/13/2043 (Originally 12/16/2014)  . INFLUENZA VACCINE  Completed   Fall Risk  02/24/2018 02/20/2017 08/30/2016 04/12/2016 03/22/2016  Falls in the past year? No No No No No  Risk for fall due to : - - - - -     Vitals:   08/26/18 0943  BP: (!) 104/54  Pulse: 71  Resp: 18  Temp: (!) 97.2 F (36.2 C)  TempSrc: Oral  SpO2: 97%  Weight: 132 lb 6.4 oz (60.1 kg)  Height: 5\' 11"  (1.803 m)   Body mass index is 18.47 kg/m.  Physical Exam  GENERAL APPEARANCE:  In no acute distress.  SKIN:  Skin is warm and dry.  MOUTH and THROAT: Lips are without lesions. Oral mucosa is moist and without lesions. Tongue is normal in shape, size, and color and without lesions RESPIRATORY: Breathing is even & unlabored, BS CTAB CARDIAC: RRR, no murmur,no extra heart sounds, no edema GI: Abdomen soft, normal BS, no masses, no tenderness NEUROLOGICAL: There is no tremor. Speech is clear.Alert and oriented X 3.  PSYCHIATRIC:  Affect and behavior are appropriate   Labs reviewed: Recent Labs    11/28/17  NA 139  K 4.2  BUN 17  CREATININE 0.5*   Recent Labs    11/28/17  AST 12*  ALT 5*  ALKPHOS 79    Lab Results  Component Value Date   TSH 4.43 10/29/2017    Lab Results  Component Value Date   CHOL 135 05/27/2018   HDL 33 (A) 05/27/2018   LDLCALC 89 05/27/2018   TRIG 68 05/27/2018   CHOLHDL 5.3 09/10/2013    Assessment/Plan  1. Primary osteoarthritis of both knees - will start Biofreeze 4% gel to bilateral knees topically  2. Adult failure to thrive - latest weight 132.4 lbs, Body mass index is 18.47 kg/m., will continue Eldertonic 15 ml BID , Magic cup BID and Med pass 120 ml BID   3. Vitamin B12 deficiency - will continue Vitamin B12 500 mcg 1 tab daily, check vitamin B12 level  4. Other hyperlipidemia - continue Simvastatin 20 mg @ bedtime  5. Vascular dementia without behavioral disturbance ([place]) -will continue supportive care, fall precautions  6. Advance  care planning -  - discussed current medications and care plan with IDT    Family/ staff Communication: Discussed plan of care with IDT.  Labs/tests ordered:  Vitamin B12, CMP and CBC  Goals of care:   Long-term care.   Durenda Age, NP [facility] and Adult Medicine [phone] (Monday-Friday 8:00 a.m. - 5:00 p.m.) [phone] (after hours)

## 2018-08-27 DIAGNOSIS — D649 Anemia, unspecified: Secondary | ICD-10-CM | POA: Diagnosis not present

## 2018-08-27 DIAGNOSIS — D519 Vitamin B12 deficiency anemia, unspecified: Secondary | ICD-10-CM | POA: Diagnosis not present

## 2018-08-27 DIAGNOSIS — I129 Hypertensive chronic kidney disease with stage 1 through stage 4 chronic kidney disease, or unspecified chronic kidney disease: Secondary | ICD-10-CM | POA: Diagnosis not present

## 2018-08-27 DIAGNOSIS — I10 Essential (primary) hypertension: Secondary | ICD-10-CM | POA: Diagnosis not present

## 2018-08-27 DIAGNOSIS — E561 Deficiency of vitamin K: Secondary | ICD-10-CM | POA: Diagnosis not present

## 2018-08-27 DIAGNOSIS — E785 Hyperlipidemia, unspecified: Secondary | ICD-10-CM | POA: Diagnosis not present

## 2018-08-27 LAB — CBC AND DIFFERENTIAL
HEMATOCRIT: 38 — AB (ref 41–53)
Hemoglobin: 13 — AB (ref 13.5–17.5)
NEUTROS ABS: 5
Platelets: 198 (ref 150–399)
WBC: 7.7

## 2018-08-27 LAB — HEPATIC FUNCTION PANEL
ALT: 11 (ref 10–40)
AST: 17 (ref 14–40)
Alkaline Phosphatase: 77 (ref 25–125)
BILIRUBIN, TOTAL: 0.3

## 2018-08-27 LAB — BASIC METABOLIC PANEL
BUN: 21 (ref 4–21)
Creatinine: 0.6 (ref 0.6–1.3)
Glucose: 91
Potassium: 4.5 (ref 3.4–5.3)
Sodium: 142 (ref 137–147)

## 2018-08-27 LAB — VITAMIN B12: Vitamin B-12: 399

## 2018-08-28 NOTE — Progress Notes (Signed)
08/27/18

## 2018-09-17 ENCOUNTER — Non-Acute Institutional Stay (SKILLED_NURSING_FACILITY): Payer: Medicare Other | Admitting: Internal Medicine

## 2018-09-17 ENCOUNTER — Encounter: Payer: Self-pay | Admitting: Internal Medicine

## 2018-09-17 DIAGNOSIS — I10 Essential (primary) hypertension: Secondary | ICD-10-CM

## 2018-09-17 DIAGNOSIS — I739 Peripheral vascular disease, unspecified: Secondary | ICD-10-CM

## 2018-09-17 DIAGNOSIS — E7849 Other hyperlipidemia: Secondary | ICD-10-CM | POA: Diagnosis not present

## 2018-09-17 DIAGNOSIS — E538 Deficiency of other specified B group vitamins: Secondary | ICD-10-CM | POA: Diagnosis not present

## 2018-09-17 NOTE — Progress Notes (Signed)
NURSING HOME LOCATION:  [facility] ROOM NUMBER:[id number]    CODE STATUS: Full Code  PCP:  [name] MD.  This is a nursing facility follow up of chronic medical diagnoses  Interim medical record and care since last [facility] visit was updated with review of diagnostic studies and change in clinical status since last visit were documented.  HPI: He is a permanent resident of the SNF with diagnoses of dyslipidemia, protein/caloric malnutrition, CKD stage III, essential hypertension, history of prostate cancer, anemia with B12 deficiency, coronary disease status post MI, and history of stroke complicated by dysphagia. Lipids were checked 05/27/2018.  LDL was slightly above goal at 89 on low-dose simvastatin 20 mg.  He has a past medical history of rhabdomyolysis.   On 08/27/2018 renal function & chemistries were normal.  Mild anemia was present with hemoglobin 13 and hematocrit 38.  B12 level was normal at 399.  Review of systems: He was able to give the date.  He has no active symptoms except for arthritic pain in the knees.  He is being treated with a topical anti-inflammatory agent as well as Tylenol.  He specifically denies dysphagia.  Staff validates that he is not having swallowing difficulties.  Constitutional: No fever, significant weight change, fatigue  Eyes: No redness, discharge, pain, vision change ENT/mouth: No nasal congestion,  purulent discharge, earache, change in hearing, sore throat  Cardiovascular: No chest pain, palpitations, paroxysmal nocturnal dyspnea, claudication, edema  Respiratory: No cough, sputum production, hemoptysis, DOE, significant snoring, apnea   Gastrointestinal: No heartburn, dysphagia, abdominal pain, nausea /vomiting, rectal bleeding, melena, change in bowels Genitourinary: No dysuria, hematuria, pyuria, incontinence, nocturia Dermatologic: No rash, pruritus, change in appearance of skin Neurologic: No dizziness, headache,  syncope, seizures, numbness, tingling Psychiatric: No significant anxiety, depression, insomnia, anorexia Endocrine: No change in hair/skin/nails, excessive thirst, excessive hunger, excessive urination  Hematologic/lymphatic: No significant bruising, lymphadenopathy, abnormal bleeding Allergy/immunology: No itchy/watery eyes, significant sneezing, urticaria, angioedema  Physical exam:  Pertinent or positive findings: He appears grossly cachectic and wasted with visible facial muscle wasting especially the temples.  He also has wasting of the limbs and interosseous muscles of the hands.  Pattern alopecia is present.  Hair is disheveled.  He is hard of hearing.  He has dense arcus senilis.  His eyes tend to be opened widely as if alarmed.  His mouth is agape.  He is edentulous.  The lower lip is sunken and has multiple vertical creases.  Minor rales are noted.  Heart sounds are markedly distant.  Pedal pulses are decreased.  The feet are in booties.  Despite the muscular wasting strength in the upper extremities with surprisingly good.  There is essentially no range of motion of the lower extremities.  He has flexion contractures of the fourth and and fifth right fingers.  He has severe contractures of the toes.  There is severe thickening and distortion of the great toenails.  General appearance: no acute distress, increased work of breathing is present.   Lymphatic: No lymphadenopathy about the head, neck, axilla. Eyes: No conjunctival inflammation or lid edema is present. There is no scleral icterus. Ears:  External ear exam shows no significant lesions or deformities.   Nose:  External nasal examination shows no deformity or inflammation. Nasal mucosa are pink and moist without lesions, exudates Oral exam: There is no oropharyngeal erythema or exudate. Neck:  No thyromegaly, masses, tenderness noted.    Heart:  Normal rate and regular rhythm. S1 and  S2 normal without gallop, murmur, click, rub .    Lungs:  without wheezes, rhonchi,  rubs. Abdomen: Bowel sounds are normal. Abdomen is soft and nontender with no organomegaly, hernias, masses. GU: Deferred  Extremities:  No cyanosis, clubbing, edema  Neurologic exam : Balance, Rhomberg, finger to nose testing could not be completed due to clinical state Skin: Warm & dry w/o tenting. No significant lesions or rash.  See summary under each active problem in the Problem List with associated updated therapeutic plan

## 2018-09-17 NOTE — Assessment & Plan Note (Signed)
Secondary prevention would focus on lipids, blood pressure, and glucose

## 2018-09-17 NOTE — Patient Instructions (Signed)
See assessment and plan under each diagnosis in the problem list and acutely for this visit 

## 2018-09-17 NOTE — Assessment & Plan Note (Signed)
08/27/2017 B12 level 399; no change indicated

## 2018-09-17 NOTE — Assessment & Plan Note (Signed)
05/27/2018 LDL 89 on 20 mg of lipophilic agent simvastatin.  Past medical history of rhabdomyolysis.  Will change to low-dose generic Crestor 20 mg which is hydrophilic and recheck lipids, liver function, and CK in 10-12 weeks.

## 2018-09-17 NOTE — Assessment & Plan Note (Signed)
Blood pressure normal without antihypertensive medications.  No intervention necessary.

## 2018-10-14 DIAGNOSIS — I69153 Hemiplegia and hemiparesis following nontraumatic intracerebral hemorrhage affecting right non-dominant side: Secondary | ICD-10-CM | POA: Diagnosis not present

## 2018-10-14 DIAGNOSIS — M24521 Contracture, right elbow: Secondary | ICD-10-CM | POA: Diagnosis not present

## 2018-10-14 DIAGNOSIS — M6281 Muscle weakness (generalized): Secondary | ICD-10-CM | POA: Diagnosis not present

## 2018-10-15 DIAGNOSIS — M24521 Contracture, right elbow: Secondary | ICD-10-CM | POA: Diagnosis not present

## 2018-10-15 DIAGNOSIS — I69153 Hemiplegia and hemiparesis following nontraumatic intracerebral hemorrhage affecting right non-dominant side: Secondary | ICD-10-CM | POA: Diagnosis not present

## 2018-10-15 DIAGNOSIS — M6281 Muscle weakness (generalized): Secondary | ICD-10-CM | POA: Diagnosis not present

## 2018-10-16 DIAGNOSIS — M24521 Contracture, right elbow: Secondary | ICD-10-CM | POA: Diagnosis not present

## 2018-10-16 DIAGNOSIS — I69153 Hemiplegia and hemiparesis following nontraumatic intracerebral hemorrhage affecting right non-dominant side: Secondary | ICD-10-CM | POA: Diagnosis not present

## 2018-10-16 DIAGNOSIS — M6281 Muscle weakness (generalized): Secondary | ICD-10-CM | POA: Diagnosis not present

## 2018-10-19 DIAGNOSIS — M24521 Contracture, right elbow: Secondary | ICD-10-CM | POA: Diagnosis not present

## 2018-10-19 DIAGNOSIS — I69153 Hemiplegia and hemiparesis following nontraumatic intracerebral hemorrhage affecting right non-dominant side: Secondary | ICD-10-CM | POA: Diagnosis not present

## 2018-10-19 DIAGNOSIS — M6281 Muscle weakness (generalized): Secondary | ICD-10-CM | POA: Diagnosis not present

## 2018-10-20 DIAGNOSIS — M6281 Muscle weakness (generalized): Secondary | ICD-10-CM | POA: Diagnosis not present

## 2018-10-20 DIAGNOSIS — I69153 Hemiplegia and hemiparesis following nontraumatic intracerebral hemorrhage affecting right non-dominant side: Secondary | ICD-10-CM | POA: Diagnosis not present

## 2018-10-20 DIAGNOSIS — M24521 Contracture, right elbow: Secondary | ICD-10-CM | POA: Diagnosis not present

## 2018-10-21 ENCOUNTER — Non-Acute Institutional Stay (SKILLED_NURSING_FACILITY): Payer: Medicare Other | Admitting: Internal Medicine

## 2018-10-21 ENCOUNTER — Encounter: Payer: Self-pay | Admitting: Internal Medicine

## 2018-10-21 DIAGNOSIS — I10 Essential (primary) hypertension: Secondary | ICD-10-CM | POA: Diagnosis not present

## 2018-10-21 DIAGNOSIS — M6281 Muscle weakness (generalized): Secondary | ICD-10-CM | POA: Diagnosis not present

## 2018-10-21 DIAGNOSIS — M1710 Unilateral primary osteoarthritis, unspecified knee: Secondary | ICD-10-CM | POA: Diagnosis not present

## 2018-10-21 DIAGNOSIS — E7849 Other hyperlipidemia: Secondary | ICD-10-CM

## 2018-10-21 DIAGNOSIS — F0150 Vascular dementia without behavioral disturbance: Secondary | ICD-10-CM

## 2018-10-21 DIAGNOSIS — M24521 Contracture, right elbow: Secondary | ICD-10-CM | POA: Diagnosis not present

## 2018-10-21 DIAGNOSIS — E538 Deficiency of other specified B group vitamins: Secondary | ICD-10-CM | POA: Diagnosis not present

## 2018-10-21 DIAGNOSIS — IMO0002 Reserved for concepts with insufficient information to code with codable children: Secondary | ICD-10-CM

## 2018-10-21 DIAGNOSIS — I69153 Hemiplegia and hemiparesis following nontraumatic intracerebral hemorrhage affecting right non-dominant side: Secondary | ICD-10-CM | POA: Diagnosis not present

## 2018-10-21 NOTE — Progress Notes (Signed)
Location:    [facility] Room Number: 310/A Place of Service:  SNF (31) Provider:  Granville Lewis PA-C  [name], MD  Patient Care Team: [name], MD as PCP - General (Internal Medicine) Rehab, [facility])  Extended Emergency Contact Information Primary Emergency Contact: Wescoat,[name] Address: [place],  [address] [name] Phone: [phone] Relation: Daughter  Code Status:  Full Code Goals of care: Advanced Directive information Advanced Directives 10/21/2018  Does Patient Have a Medical Advance Directive? Yes  Type of Advance Directive (No Data)  Does patient want to make changes to medical advance directive? No - Patient declined  Would patient like information on creating a medical advance directive? -  Pre-existing out of facility DNR order (yellow form or pink MOST form) -    Chief complaint routine visit for medical management of- hypertension-protein calorie malnutrition- dyslipidemia- B12 deficiency- history of CVA complicated by dysphasia--as well as dementia and osteoarthritis      HPI:  Pt is a 85 y.o. male seen today for medical management of chronic diseases.  As noted above.  He has no complaints today.  His weight appears to be stable at 133 pounds he is on numerous supplements including Eldertonic twice daily as well as med Pass twice daily and Magic cup twice daily.  His vital signs appear to be stable blood pressure appears to be well controlled on no medications 118/78- 118/76-106/60 110/60 recently.  Is hyperlipidemia medication has been switched from simvastatin to Crestor since it is a hydrophilic agent  LDL was 89 on lab done in October this will need to be rechecked in approximately 6 weeks.  Liver function tests have been within normal limits.  Regards to B12 deficiency hemoglobin is stable at 13.0 B12  level in January was adequate at 399.  Currently he is resting in bed comfortably and has no complaints he is bright and alert quite frail-appearing  He is not complaining of any joint pain today he does receive Biofreeze which apparently is helping for his knees  Past Medical History:  Diagnosis Date  . 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 Cancer ([place])   . Chronic kidney disease   . Dementia ([place])   . Dysphagia   . Fall   . Hypertension   . Rhabdomyolysis   . Symbolic dysfunction    Social impairment   History reviewed. No pertinent surgical history.  No Known Allergies  Allergies as of 10/21/2018   No Known Allergies     Medication List       Accurate as of October 21, 2018 11:31 AM. Always use your most recent med list.        acetaminophen 325 MG tablet Commonly known as:  TYLENOL Take 650 mg by mouth 2 (two) times daily as needed for mild pain, moderate pain or fever.   BIOFREEZE EX Apply 1 application topically 3 (three) times daily. Apply to right knee and right hip each shift for pain   bisacodyl 10 MG suppository Commonly known as:  DULCOLAX Constipation (2 of 4): If not relieved by MOM, give 10 mg Bisacodyl suppositiory rectally X 1 dose in 24 hours as needed (Do not use constipation standing orders for residents with renal failure/CFR less than 30. Contact MD for orders)   geriatric multivitamins-minerals Liqd  Take 15 mLs by mouth 2 (two) times daily.   magnesium hydroxide 400 MG/5ML suspension Commonly known as:  MILK OF MAGNESIA Constipation (1 of 4): If no BM in 3 days, give 30 cc Milk of Magnesium p.o. x 1 dose in 24 hours as needed (Do not use standing constipation orders for residents with renal failure CFR less than 30. Contact MD for orders)   NUTRITIONAL SUPPLEMENT PO Take 1 each by mouth 2 (two) times daily. Magic Cup   Nutritional Supplement Liqd Take 120 mLs by mouth 2 (two) times daily. MedPass   RA SALINE ENEMA  RE Constipation (3 of 4): If not relieved by Biscodyl suppository, give disposable Saline Enema rectally X 1 dose/24 hrs as needed (Do not use constipation standing orders for residents with renal failure/CFR less than 30. Contact MD for orders)    Constipation (4 of 4): Contact MD as needed if no results from enema (Nursing Measure)   vitamin B-12 500 MCG tablet Commonly known as:  CYANOCOBALAMIN Take 500 mcg by mouth daily.       Review of Systems  This is somewhat limited since patient at times can be a poor historian.  In general no complaints of fever chills weight appears to be stable.  Skin does not complain of rashes or itching.  Head ears eyes nose mouth and throat is not complaining of visual changes or sore throat.  Respiratory does not complain of shortness of breath or cough.  Cardiac does not complain of chest pain or edema.  GI is not complaining of abdominal pain nausea vomiting diarrhea or constipation.  GU no complaints of dysuria.  Musculoskeletal has complained of knee pain in the past but is not complaining of that today.  Neurologic does not complain of dizziness headache syncope does have significant weakness.  And psych does not complain of being anxious or depressed appears to be in good spirits  Immunization History  Administered Date(s) Administered  . Influenza Split 05/08/2011, 05/07/2012  . Influenza Whole 04/22/2008, 05/25/2009  . Influenza-Unspecified 05/02/2016, 05/15/2017, 06/01/2018  . PPD Test 12/15/2013, 01/16/2015, 02/15/2015  . Pneumococcal Polysaccharide-23 04/10/2010, 12/15/2013  . Td 04/10/2010   Pertinent  Health Maintenance Due  Topic Date Due  . PNA vac Low Risk Adult (2 of 2 - PCV13) 10/13/2043 (Originally 12/16/2014)  . INFLUENZA VACCINE  Completed   Fall Risk  02/24/2018 02/20/2017 08/30/2016 04/12/2016 03/22/2016  Falls in the past year? No No No No No  Risk for fall due to : - - - - -   Functional Status Survey:     Vitals:   10/21/18 1124  BP: 110/60  Pulse: 68  Resp: 20  Temp: 98.7 F (37.1 C)  TempSrc: Oral  SpO2: 97%  Weight: 132 lb 12.8 oz (60.2 kg)  Height: 5\' 11"  (1.803 m)   Body mass index is 18.52 kg/m. Physical Exam  In general this is a pleasant frail-appearing elderly male in no distress.  His skin is warm and dry.  Eyes visual acuity appears to be intact he does have arcus senilis.  Oropharynx is clear mucous membranes appear fairly moist he is largely edentulous.  Chest is clear to auscultation with somewhat shallow air entry there is no labored breathing.  Heart sounds continue to be distant from what I could auscultate and radially regular rate and rhythm.  Abdomen is soft nontender with positive bowel sounds does have a c arthritic appearance   Musculoskeletal has significant stiffness of his lower extremities  with contractures of his toes which are baseline Is able to move his upper extremities it appears at baseline-he does have diffuse muscle wasting.  Neurologic as noted above his speech is clear could not really appreciate true lateralizing findings.  Psych he is pleasant and appropriate appeared largely oriented and appropriate Labs reviewed: Recent Labs    11/28/17 08/27/18  NA 139 142  K 4.2 4.5  BUN 17 21  CREATININE 0.5* 0.6   Recent Labs    11/28/17 08/27/18  AST 12* 17  ALT 5* 11  ALKPHOS 79 77   Recent Labs    08/27/18  WBC 7.7  NEUTROABS 5  HGB 13.0*  HCT 38*  PLT 198   Lab Results  Component Value Date   TSH 4.43 10/29/2017   No results found for: HGBA1C Lab Results  Component Value Date   CHOL 135 05/27/2018   HDL 33 (A) 05/27/2018   LDLCALC 89 05/27/2018   TRIG 68 05/27/2018   CHOLHDL 5.3 09/10/2013    Significant Diagnostic Results in last 30 days:  No results found.  Assessment/Plan  #1- history of hypertension this appears well controlled on no medications as noted above.  2.  History of dyslipidemia he is now on  Crestor LDL was 89 on October 2019 lab update lipid panel should be obtained in about 6 to 8 weeks-liver function tests have been within normal limits.  3.  Dementia this appears to be mild he appears to do well with supportive care- weight has been stable he does have a history of protein calorie malnutrition but appears to be stabilized off his numerous supplements including Eldertonic med Pass and Magic cup.  4.-History of B12 deficiency this appears to be stable on supplementation B12 level was 339 in late January.  5.  History of osteoarthritis he does receive Biofreeze and apparently this is helping with his knees he also has a Tylenol as needed order.  6.  Protein calorie malnutrition as noted above he continues on significant supplements weight appears to be stabilized.  [id number]

## 2018-10-22 DIAGNOSIS — M6281 Muscle weakness (generalized): Secondary | ICD-10-CM | POA: Diagnosis not present

## 2018-10-22 DIAGNOSIS — M24521 Contracture, right elbow: Secondary | ICD-10-CM | POA: Diagnosis not present

## 2018-10-22 DIAGNOSIS — I69153 Hemiplegia and hemiparesis following nontraumatic intracerebral hemorrhage affecting right non-dominant side: Secondary | ICD-10-CM | POA: Diagnosis not present

## 2018-10-23 DIAGNOSIS — M6281 Muscle weakness (generalized): Secondary | ICD-10-CM | POA: Diagnosis not present

## 2018-10-23 DIAGNOSIS — I69153 Hemiplegia and hemiparesis following nontraumatic intracerebral hemorrhage affecting right non-dominant side: Secondary | ICD-10-CM | POA: Diagnosis not present

## 2018-10-23 DIAGNOSIS — M24521 Contracture, right elbow: Secondary | ICD-10-CM | POA: Diagnosis not present

## 2018-10-26 DIAGNOSIS — M6281 Muscle weakness (generalized): Secondary | ICD-10-CM | POA: Diagnosis not present

## 2018-10-26 DIAGNOSIS — I69153 Hemiplegia and hemiparesis following nontraumatic intracerebral hemorrhage affecting right non-dominant side: Secondary | ICD-10-CM | POA: Diagnosis not present

## 2018-10-26 DIAGNOSIS — M24521 Contracture, right elbow: Secondary | ICD-10-CM | POA: Diagnosis not present

## 2018-10-27 DIAGNOSIS — M24521 Contracture, right elbow: Secondary | ICD-10-CM | POA: Diagnosis not present

## 2018-10-27 DIAGNOSIS — I69153 Hemiplegia and hemiparesis following nontraumatic intracerebral hemorrhage affecting right non-dominant side: Secondary | ICD-10-CM | POA: Diagnosis not present

## 2018-10-27 DIAGNOSIS — M6281 Muscle weakness (generalized): Secondary | ICD-10-CM | POA: Diagnosis not present

## 2018-10-28 DIAGNOSIS — M6281 Muscle weakness (generalized): Secondary | ICD-10-CM | POA: Diagnosis not present

## 2018-10-28 DIAGNOSIS — I69153 Hemiplegia and hemiparesis following nontraumatic intracerebral hemorrhage affecting right non-dominant side: Secondary | ICD-10-CM | POA: Diagnosis not present

## 2018-10-28 DIAGNOSIS — E441 Mild protein-calorie malnutrition: Secondary | ICD-10-CM | POA: Diagnosis not present

## 2018-10-28 DIAGNOSIS — R1311 Dysphagia, oral phase: Secondary | ICD-10-CM | POA: Diagnosis not present

## 2018-10-29 DIAGNOSIS — R1311 Dysphagia, oral phase: Secondary | ICD-10-CM | POA: Diagnosis not present

## 2018-10-29 DIAGNOSIS — M6281 Muscle weakness (generalized): Secondary | ICD-10-CM | POA: Diagnosis not present

## 2018-10-29 DIAGNOSIS — E441 Mild protein-calorie malnutrition: Secondary | ICD-10-CM | POA: Diagnosis not present

## 2018-10-29 DIAGNOSIS — I69153 Hemiplegia and hemiparesis following nontraumatic intracerebral hemorrhage affecting right non-dominant side: Secondary | ICD-10-CM | POA: Diagnosis not present

## 2018-10-30 DIAGNOSIS — M6281 Muscle weakness (generalized): Secondary | ICD-10-CM | POA: Diagnosis not present

## 2018-10-30 DIAGNOSIS — E441 Mild protein-calorie malnutrition: Secondary | ICD-10-CM | POA: Diagnosis not present

## 2018-10-30 DIAGNOSIS — I69153 Hemiplegia and hemiparesis following nontraumatic intracerebral hemorrhage affecting right non-dominant side: Secondary | ICD-10-CM | POA: Diagnosis not present

## 2018-10-30 DIAGNOSIS — R1311 Dysphagia, oral phase: Secondary | ICD-10-CM | POA: Diagnosis not present

## 2018-11-02 DIAGNOSIS — I69153 Hemiplegia and hemiparesis following nontraumatic intracerebral hemorrhage affecting right non-dominant side: Secondary | ICD-10-CM | POA: Diagnosis not present

## 2018-11-02 DIAGNOSIS — M6281 Muscle weakness (generalized): Secondary | ICD-10-CM | POA: Diagnosis not present

## 2018-11-02 DIAGNOSIS — E441 Mild protein-calorie malnutrition: Secondary | ICD-10-CM | POA: Diagnosis not present

## 2018-11-02 DIAGNOSIS — R1311 Dysphagia, oral phase: Secondary | ICD-10-CM | POA: Diagnosis not present

## 2018-11-03 DIAGNOSIS — R1311 Dysphagia, oral phase: Secondary | ICD-10-CM | POA: Diagnosis not present

## 2018-11-03 DIAGNOSIS — M6281 Muscle weakness (generalized): Secondary | ICD-10-CM | POA: Diagnosis not present

## 2018-11-03 DIAGNOSIS — E441 Mild protein-calorie malnutrition: Secondary | ICD-10-CM | POA: Diagnosis not present

## 2018-11-03 DIAGNOSIS — I69153 Hemiplegia and hemiparesis following nontraumatic intracerebral hemorrhage affecting right non-dominant side: Secondary | ICD-10-CM | POA: Diagnosis not present

## 2018-11-04 DIAGNOSIS — R1311 Dysphagia, oral phase: Secondary | ICD-10-CM | POA: Diagnosis not present

## 2018-11-04 DIAGNOSIS — E441 Mild protein-calorie malnutrition: Secondary | ICD-10-CM | POA: Diagnosis not present

## 2018-11-04 DIAGNOSIS — M6281 Muscle weakness (generalized): Secondary | ICD-10-CM | POA: Diagnosis not present

## 2018-11-04 DIAGNOSIS — I69153 Hemiplegia and hemiparesis following nontraumatic intracerebral hemorrhage affecting right non-dominant side: Secondary | ICD-10-CM | POA: Diagnosis not present

## 2018-11-05 ENCOUNTER — Encounter: Payer: Self-pay | Admitting: Internal Medicine

## 2018-11-05 ENCOUNTER — Inpatient Hospital Stay (HOSPITAL_COMMUNITY)
Admission: EM | Admit: 2018-11-05 | Discharge: 2018-11-27 | DRG: 308 | Disposition: E | Payer: Medicare Other | Source: Skilled Nursing Facility | Attending: Internal Medicine | Admitting: Internal Medicine

## 2018-11-05 ENCOUNTER — Non-Acute Institutional Stay (SKILLED_NURSING_FACILITY): Payer: Medicare Other | Admitting: Internal Medicine

## 2018-11-05 ENCOUNTER — Emergency Department (HOSPITAL_COMMUNITY): Payer: Medicare Other

## 2018-11-05 ENCOUNTER — Inpatient Hospital Stay (HOSPITAL_COMMUNITY): Payer: Medicare Other

## 2018-11-05 DIAGNOSIS — N179 Acute kidney failure, unspecified: Secondary | ICD-10-CM | POA: Diagnosis present

## 2018-11-05 DIAGNOSIS — K8020 Calculus of gallbladder without cholecystitis without obstruction: Secondary | ICD-10-CM | POA: Diagnosis not present

## 2018-11-05 DIAGNOSIS — I499 Cardiac arrhythmia, unspecified: Secondary | ICD-10-CM | POA: Diagnosis not present

## 2018-11-05 DIAGNOSIS — I4891 Unspecified atrial fibrillation: Secondary | ICD-10-CM | POA: Diagnosis present

## 2018-11-05 DIAGNOSIS — E039 Hypothyroidism, unspecified: Secondary | ICD-10-CM | POA: Diagnosis present

## 2018-11-05 DIAGNOSIS — I469 Cardiac arrest, cause unspecified: Secondary | ICD-10-CM | POA: Diagnosis not present

## 2018-11-05 DIAGNOSIS — E876 Hypokalemia: Secondary | ICD-10-CM | POA: Diagnosis present

## 2018-11-05 DIAGNOSIS — E785 Hyperlipidemia, unspecified: Secondary | ICD-10-CM | POA: Diagnosis present

## 2018-11-05 DIAGNOSIS — I493 Ventricular premature depolarization: Secondary | ICD-10-CM | POA: Diagnosis present

## 2018-11-05 DIAGNOSIS — R4182 Altered mental status, unspecified: Secondary | ICD-10-CM | POA: Diagnosis not present

## 2018-11-05 DIAGNOSIS — R079 Chest pain, unspecified: Secondary | ICD-10-CM | POA: Diagnosis not present

## 2018-11-05 DIAGNOSIS — R404 Transient alteration of awareness: Secondary | ICD-10-CM | POA: Diagnosis not present

## 2018-11-05 DIAGNOSIS — E872 Acidosis: Secondary | ICD-10-CM | POA: Diagnosis present

## 2018-11-05 DIAGNOSIS — Z79899 Other long term (current) drug therapy: Secondary | ICD-10-CM | POA: Diagnosis not present

## 2018-11-05 DIAGNOSIS — Z66 Do not resuscitate: Secondary | ICD-10-CM | POA: Diagnosis present

## 2018-11-05 DIAGNOSIS — E43 Unspecified severe protein-calorie malnutrition: Secondary | ICD-10-CM | POA: Diagnosis present

## 2018-11-05 DIAGNOSIS — Z515 Encounter for palliative care: Secondary | ICD-10-CM | POA: Diagnosis present

## 2018-11-05 DIAGNOSIS — F0390 Unspecified dementia without behavioral disturbance: Secondary | ICD-10-CM | POA: Diagnosis present

## 2018-11-05 DIAGNOSIS — Z87891 Personal history of nicotine dependence: Secondary | ICD-10-CM | POA: Diagnosis not present

## 2018-11-05 DIAGNOSIS — I959 Hypotension, unspecified: Secondary | ICD-10-CM | POA: Diagnosis not present

## 2018-11-05 DIAGNOSIS — E7849 Other hyperlipidemia: Secondary | ICD-10-CM | POA: Diagnosis not present

## 2018-11-05 DIAGNOSIS — J90 Pleural effusion, not elsewhere classified: Secondary | ICD-10-CM | POA: Diagnosis present

## 2018-11-05 DIAGNOSIS — F0150 Vascular dementia without behavioral disturbance: Secondary | ICD-10-CM | POA: Diagnosis not present

## 2018-11-05 DIAGNOSIS — R532 Functional quadriplegia: Secondary | ICD-10-CM | POA: Diagnosis present

## 2018-11-05 DIAGNOSIS — Z681 Body mass index (BMI) 19 or less, adult: Secondary | ICD-10-CM | POA: Diagnosis not present

## 2018-11-05 DIAGNOSIS — N182 Chronic kidney disease, stage 2 (mild): Secondary | ICD-10-CM | POA: Diagnosis present

## 2018-11-05 DIAGNOSIS — R531 Weakness: Secondary | ICD-10-CM | POA: Diagnosis not present

## 2018-11-05 DIAGNOSIS — I129 Hypertensive chronic kidney disease with stage 1 through stage 4 chronic kidney disease, or unspecified chronic kidney disease: Secondary | ICD-10-CM | POA: Diagnosis present

## 2018-11-05 DIAGNOSIS — R1310 Dysphagia, unspecified: Secondary | ICD-10-CM | POA: Diagnosis present

## 2018-11-05 LAB — CBC WITH DIFFERENTIAL/PLATELET
Abs Immature Granulocytes: 0.14 10*3/uL — ABNORMAL HIGH (ref 0.00–0.07)
Basophils Absolute: 0 10*3/uL (ref 0.0–0.1)
Basophils Relative: 0 %
Eosinophils Absolute: 0 10*3/uL (ref 0.0–0.5)
Eosinophils Relative: 0 %
HCT: 33.9 % — ABNORMAL LOW (ref 39.0–52.0)
Hemoglobin: 10.4 g/dL — ABNORMAL LOW (ref 13.0–17.0)
Immature Granulocytes: 1 %
Lymphocytes Relative: 10 %
Lymphs Abs: 1 10*3/uL (ref 0.7–4.0)
MCH: 30.6 pg (ref 26.0–34.0)
MCHC: 30.7 g/dL (ref 30.0–36.0)
MCV: 99.7 fL (ref 80.0–100.0)
Monocytes Absolute: 0.6 10*3/uL (ref 0.1–1.0)
Monocytes Relative: 6 %
Neutro Abs: 8.1 10*3/uL — ABNORMAL HIGH (ref 1.7–7.7)
Neutrophils Relative %: 83 %
Platelets: 243 10*3/uL (ref 150–400)
RBC: 3.4 MIL/uL — ABNORMAL LOW (ref 4.22–5.81)
RDW: 13.8 % (ref 11.5–15.5)
WBC: 10 10*3/uL (ref 4.0–10.5)
nRBC: 0 % (ref 0.0–0.2)

## 2018-11-05 LAB — TSH: TSH: 4.888 u[IU]/mL — ABNORMAL HIGH (ref 0.350–4.500)

## 2018-11-05 LAB — COMPREHENSIVE METABOLIC PANEL
ALT: 23 U/L (ref 0–44)
AST: 41 U/L (ref 15–41)
Albumin: 1.4 g/dL — ABNORMAL LOW (ref 3.5–5.0)
Alkaline Phosphatase: 64 U/L (ref 38–126)
Anion gap: 18 — ABNORMAL HIGH (ref 5–15)
BUN: 33 mg/dL — ABNORMAL HIGH (ref 8–23)
CO2: 12 mmol/L — ABNORMAL LOW (ref 22–32)
Calcium: 5.7 mg/dL — CL (ref 8.9–10.3)
Chloride: 110 mmol/L (ref 98–111)
Creatinine, Ser: 3.1 mg/dL — ABNORMAL HIGH (ref 0.61–1.24)
GFR calc Af Amer: 20 mL/min — ABNORMAL LOW (ref >60)
GFR calc non Af Amer: 17 mL/min — ABNORMAL LOW (ref >60)
Glucose, Bld: 88 mg/dL (ref 70–99)
Potassium: 2.1 mmol/L — CL (ref 3.5–5.1)
Sodium: 140 mmol/L (ref 135–145)
Total Bilirubin: 0.9 mg/dL (ref 0.3–1.2)
Total Protein: 3.7 g/dL — ABNORMAL LOW (ref 6.5–8.1)

## 2018-11-05 LAB — BASIC METABOLIC PANEL
Anion gap: 21 — ABNORMAL HIGH (ref 5–15)
BUN: 43 mg/dL — ABNORMAL HIGH (ref 8–23)
CO2: 15 mmol/L — ABNORMAL LOW (ref 22–32)
Calcium: 7.5 mg/dL — ABNORMAL LOW (ref 8.9–10.3)
Chloride: 100 mmol/L (ref 98–111)
Creatinine, Ser: 3.91 mg/dL — ABNORMAL HIGH (ref 0.61–1.24)
GFR calc Af Amer: 15 mL/min — ABNORMAL LOW (ref >60)
GFR calc non Af Amer: 13 mL/min — ABNORMAL LOW (ref >60)
Glucose, Bld: 136 mg/dL — ABNORMAL HIGH (ref 70–99)
Potassium: 2.8 mmol/L — ABNORMAL LOW (ref 3.5–5.1)
Sodium: 136 mmol/L (ref 135–145)

## 2018-11-05 LAB — POCT I-STAT EG7
Acid-base deficit: 13 mmol/L — ABNORMAL HIGH (ref 0.0–2.0)
Bicarbonate: 13.9 mmol/L — ABNORMAL LOW (ref 20.0–28.0)
Calcium, Ion: 0.99 mmol/L — ABNORMAL LOW (ref 1.15–1.40)
HCT: 35 % — ABNORMAL LOW (ref 39.0–52.0)
Hemoglobin: 11.9 g/dL — ABNORMAL LOW (ref 13.0–17.0)
O2 Saturation: 83 %
Potassium: 3.9 mmol/L (ref 3.5–5.1)
Sodium: 136 mmol/L (ref 135–145)
TCO2: 15 mmol/L — ABNORMAL LOW (ref 22–32)
pCO2, Ven: 33.1 mmHg — ABNORMAL LOW (ref 44.0–60.0)
pH, Ven: 7.231 — ABNORMAL LOW (ref 7.250–7.430)
pO2, Ven: 55 mmHg — ABNORMAL HIGH (ref 32.0–45.0)

## 2018-11-05 LAB — CBG MONITORING, ED: Glucose-Capillary: 96 mg/dL (ref 70–99)

## 2018-11-05 LAB — TROPONIN I: Troponin I: <0.03 ng/mL (ref <0.03)

## 2018-11-05 LAB — T4, FREE: Free T4: 1.27 ng/dL (ref 0.82–1.77)

## 2018-11-05 LAB — LACTIC ACID, PLASMA
Lactic Acid, Venous: 3.7 mmol/L (ref 0.5–1.9)
Lactic Acid, Venous: 5.6 mmol/L (ref 0.5–1.9)
Lactic Acid, Venous: 2.6 mmol/L (ref 0.5–1.9)

## 2018-11-05 LAB — I-STAT CREATININE, ED: Creatinine, Ser: 3.8 mg/dL — ABNORMAL HIGH (ref 0.61–1.24)

## 2018-11-05 LAB — MAGNESIUM: Magnesium: 1.5 mg/dL — ABNORMAL LOW (ref 1.7–2.4)

## 2018-11-05 LAB — MRSA PCR SCREENING: MRSA by PCR: NEGATIVE

## 2018-11-05 MED ORDER — CALCIUM GLUCONATE-NACL 1-0.675 GM/50ML-% IV SOLN
1.0000 g | Freq: Once | INTRAVENOUS | Status: AC
  Administered 2018-11-05: 1000 mg via INTRAVENOUS
  Filled 2018-11-05: qty 50

## 2018-11-05 MED ORDER — POTASSIUM CHLORIDE 10 MEQ/100ML IV SOLN
10.0000 meq | INTRAVENOUS | Status: AC
  Administered 2018-11-05 (×2): 10 meq via INTRAVENOUS
  Filled 2018-11-05: qty 100

## 2018-11-05 MED ORDER — SODIUM CHLORIDE 0.9 % IV SOLN
INTRAVENOUS | Status: DC
  Administered 2018-11-05 – 2018-11-06 (×2): via INTRAVENOUS
  Administered 2018-11-07: 1000 mL via INTRAVENOUS

## 2018-11-05 MED ORDER — ONDANSETRON HCL 4 MG/2ML IJ SOLN: 4.0000 mg | Freq: Four times a day (QID) | INTRAMUSCULAR | Status: DC | PRN

## 2018-11-05 MED ORDER — MAGNESIUM SULFATE 2 GM/50ML IV SOLN
2.0000 g | Freq: Once | INTRAVENOUS | Status: AC
  Administered 2018-11-05: 14:00:00 2 g via INTRAVENOUS
  Filled 2018-11-05: qty 50

## 2018-11-05 MED ORDER — DILTIAZEM LOAD VIA INFUSION
10.0000 mg | Freq: Once | INTRAVENOUS | Status: AC
  Administered 2018-11-05: 10 mg via INTRAVENOUS
  Filled 2018-11-05: qty 10

## 2018-11-05 MED ORDER — SODIUM CHLORIDE 0.9 % IV BOLUS (SEPSIS)
1000.0000 mL | Freq: Once | INTRAVENOUS | Status: AC
  Administered 2018-11-05: 1000 mL via INTRAVENOUS

## 2018-11-05 MED ORDER — ROSUVASTATIN CALCIUM 20 MG PO TABS
20.0000 mg | ORAL_TABLET | Freq: Every day | ORAL | Status: DC
  Administered 2018-11-05: 20:00:00 20 mg via ORAL
  Filled 2018-11-05 (×2): qty 1

## 2018-11-05 MED ORDER — SODIUM CHLORIDE 0.9 % IV BOLUS
1000.0000 mL | Freq: Once | INTRAVENOUS | Status: AC
  Administered 2018-11-05: 16:00:00 1000 mL via INTRAVENOUS

## 2018-11-05 MED ORDER — HEPARIN SODIUM (PORCINE) 5000 UNIT/ML IJ SOLN
5000.0000 [IU] | Freq: Three times a day (TID) | INTRAMUSCULAR | Status: DC
  Administered 2018-11-05 – 2018-11-07 (×5): 5000 [IU] via SUBCUTANEOUS
  Filled 2018-11-05 (×5): qty 1

## 2018-11-05 MED ORDER — ACETAMINOPHEN 650 MG RE SUPP: 650.0000 mg | Freq: Four times a day (QID) | RECTAL | Status: DC | PRN

## 2018-11-05 MED ORDER — DILTIAZEM HCL-DEXTROSE 100-5 MG/100ML-% IV SOLN (PREMIX)
5.0000 mg/h | INTRAVENOUS | Status: DC
  Administered 2018-11-05: 13:00:00 5 mg/h via INTRAVENOUS
  Administered 2018-11-06: 14:00:00 1.5 mg/h via INTRAVENOUS
  Filled 2018-11-05 (×2): qty 100

## 2018-11-05 MED ORDER — SODIUM CHLORIDE 0.9 % IV SOLN: 1.0000 g | Freq: Once | INTRAVENOUS | Status: DC

## 2018-11-05 MED ORDER — SODIUM CHLORIDE 0.9 % IV SOLN
INTRAVENOUS | Status: DC
  Administered 2018-11-05: 100 mL/h via INTRAVENOUS

## 2018-11-05 MED ORDER — POTASSIUM CHLORIDE 10 MEQ/100ML IV SOLN
10.0000 meq | INTRAVENOUS | Status: AC
  Administered 2018-11-05 (×2): 10 meq via INTRAVENOUS
  Filled 2018-11-05 (×3): qty 100

## 2018-11-05 MED ORDER — ONDANSETRON HCL 4 MG PO TABS: 4.0000 mg | ORAL_TABLET | Freq: Four times a day (QID) | ORAL | Status: DC | PRN

## 2018-11-05 MED ORDER — ACETAMINOPHEN 325 MG PO TABS: 650.0000 mg | ORAL_TABLET | Freq: Four times a day (QID) | ORAL | Status: DC | PRN

## 2018-11-05 MED ORDER — SODIUM CHLORIDE 0.9 % IV SOLN
Freq: Once | INTRAVENOUS | Status: AC
  Administered 2018-11-05: 14:00:00 125 mL/h via INTRAVENOUS

## 2018-11-05 MED ORDER — POTASSIUM CHLORIDE CRYS ER 20 MEQ PO TBCR
20.0000 meq | EXTENDED_RELEASE_TABLET | Freq: Once | ORAL | Status: DC
  Filled 2018-11-05: qty 1

## 2018-11-05 NOTE — ED Provider Notes (Signed)
[facility] EMERGENCY DEPARTMENT Provider Note   CSN: [id number] Arrival date & time: 11/06/2018  1104    History   Chief Complaint Chief Complaint  Patient presents with  . Loss of Consciousness  . Atrial Fibrillation  . Hypotension    HPI [name] [name] is a 85 y.o. male.  Level 5 caveat secondary to dementia.  EMS was called to his facility for evaluation of unresponsiveness.  They found the patient to be going in A. fib with RVR and lack of pulses and cardioverted him with 200 J.  Patient has been improving since then.  He received 200 cc normal saline bolus prior to arrival.  Patient at baseline is not oriented but does have some verbal responses.  No prior known cardiac history.  Patient denies complaints.    The history is provided by the EMS personnel and the patient.  Loss of Consciousness  Episode history:  Single Most recent episode:  Today Progression:  Improving Chronicity:  New Worsened by:  Nothing Ineffective treatments:  None tried Associated symptoms: no chest pain and no difficulty breathing   Risk factors: no seizures   Atrial Fibrillation  Pertinent negatives include no chest pain.    Past Medical History:  Diagnosis Date  . 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 Cancer ([place])   . Chronic kidney disease   . Dementia ([place])   . Dysphagia   . Fall   . Hypertension   . Rhabdomyolysis   . Symbolic dysfunction    Social impairment    Patient Active Problem List   Diagnosis Date Noted  . Peripheral arterial disease ([place]) 09/17/2018  . Flexion contracture of elbow, right 03/10/2018  . Elbow wound, right, sequela 03/10/2018  . Symbolic dysfunction   . Hyperlipidemia 11/17/2015  . 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 Dysphagia   . Hypotension 02/12/2014  . Vitamin B12 deficiency 12/14/2013  . Protein-calorie malnutrition, severe ([place]) 12/13/2013  . Anemia, macrocytic 10/20/2010  .  Dementia without behavioral disturbance ([place]) 10/11/2009  . PROSTATE CANCER 09/25/2006  . HYPERTENSION, BENIGN SYSTEMIC 09/25/2006  . CHRONIC KIDNEY DISEASE STAGE II ([facility]) 09/25/2006  . Osteoarthrosis involving lower leg 09/25/2006    No past surgical history on file.      Home Medications    Prior to Admission medications   Medication Sig Start Date End Date Taking? Authorizing Provider  acetaminophen (TYLENOL) 325 MG tablet Take 650 mg by mouth 2 (two) times daily as needed for mild pain, moderate pain or fever.     [provider]  bisacodyl (DULCOLAX) 10 MG suppository Constipation (2 of 4): If not relieved by MOM, give 10 mg Bisacodyl suppositiory rectally X 1 dose in 24 hours as needed (Do not use constipation standing orders for residents with renal failure/CFR less than 30. Contact MD for orders)    [provider]  geriatric multivitamins-minerals (ELDERTONIC/GEVRABON) LIQD Take 15 mLs by mouth 2 (two) times daily.    [provider]  magnesium hydroxide (MILK OF MAGNESIA) 400 MG/5ML suspension Constipation (1 of 4): If no BM in 3 days, give 30 cc Milk of Magnesium p.o. x 1 dose in 24 hours as needed (Do not use standing constipation orders for residents with renal failure CFR less than 30. Contact MD for orders)    [provider]  Menthol, Topical Analgesic, (BIOFREEZE EX) Apply 1 application topically 3 (three) times daily. Apply to right knee and right  hip each shift for pain    [provider]  NUTRITIONAL SUPPLEMENT LIQD Take 120 mLs by mouth 2 (two) times daily. MedPass    [provider]  Nutritional Supplements (NUTRITIONAL SUPPLEMENT PO) Take 1 each by mouth 2 (two) times daily. Social research officer, government, Historical, MD  Sodium Phosphates (RA SALINE ENEMA RE) Constipation (3 of 4): If not relieved by Biscodyl suppository, give disposable Saline Enema rectally X 1 dose/24 hrs as needed (Do not use constipation standing orders  for residents with renal failure/CFR less than 30. Contact MD for orders)    Constipation (4 of 4): Contact MD as needed if no results from enema (Nursing Measure)    [provider]  vitamin B-12 (CYANOCOBALAMIN) 500 MCG tablet Take 500 mcg by mouth daily.    [provider]    Family History No family history on file.  Social History Social History   Tobacco Use  . Smoking status: Former Smoker    Packs/day: 0.25    Years: 10.00    Pack years: 2.50    Types: Cigarettes  . Smokeless tobacco: Never Used  Substance Use Topics  . Alcohol use: No  . Drug use: No     Allergies   Patient has no known allergies.   Review of Systems Review of Systems  Unable to perform ROS: Dementia  Cardiovascular: Positive for syncope. Negative for chest pain.     Physical Exam Updated Vital Signs BP (!) 103/51   Temp 98.2 F (36.8 C) (Rectal)   Resp (!) 25   Ht 5\' 11"  (1.803 m)   Wt 49.9 kg   SpO2 93%   BMI 15.34 kg/m   Physical Exam Vitals signs and nursing note reviewed.  Constitutional:      Appearance: He is well-developed. He is cachectic.  HENT:     Head: Normocephalic and atraumatic.     Mouth/Throat:     Mouth: Mucous membranes are dry.  Eyes:     Conjunctiva/sclera: Conjunctivae normal.  Neck:     Musculoskeletal: Neck supple.  Cardiovascular:     Rate and Rhythm: Regular rhythm. Tachycardia present.     Heart sounds: No murmur.  Pulmonary:     Effort: Pulmonary effort is normal. No respiratory distress.     Breath sounds: Normal breath sounds.  Abdominal:     Palpations: Abdomen is soft.     Tenderness: There is no abdominal tenderness. There is no guarding.  Musculoskeletal:     Right lower leg: No edema.     Left lower leg: No edema.     Comments: Patient has contractures of his upper extremities.  Lower extremities and bunny boots.  Skin:    General: Skin is warm and dry.  Neurological:     Mental Status: He is alert.      Comments: Patient is awake and will answer some simple yes no questions.  He is following commands.      ED Treatments / Results  Labs (all labs ordered are listed, but only abnormal results are displayed) Labs Reviewed  LACTIC ACID, PLASMA - Abnormal; Notable for the following components:      Result Value   Lactic Acid, Venous 3.7 (*)    All other components within normal limits  LACTIC ACID, PLASMA - Abnormal; Notable for the following components:   Lactic Acid, Venous 5.6 (*)    All other components within normal limits  COMPREHENSIVE METABOLIC PANEL - Abnormal; Notable for  the following components:   Potassium 2.1 (*)    CO2 12 (*)    BUN 33 (*)    Creatinine, Ser 3.10 (*)    Calcium 5.7 (*)    Total Protein 3.7 (*)    Albumin 1.4 (*)    GFR calc non Af Amer 17 (*)    GFR calc Af Amer 20 (*)    Anion gap 18 (*)    All other components within normal limits  CBC WITH DIFFERENTIAL/PLATELET - Abnormal; Notable for the following components:   RBC 3.40 (*)    Hemoglobin 10.4 (*)    HCT 33.9 (*)    Neutro Abs 8.1 (*)    Abs Immature Granulocytes 0.14 (*)    All other components within normal limits  TSH - Abnormal; Notable for the following components:   TSH 4.888 (*)    All other components within normal limits  MAGNESIUM - Abnormal; Notable for the following components:   Magnesium 1.5 (*)    All other components within normal limits  BASIC METABOLIC PANEL - Abnormal; Notable for the following components:   Potassium 2.8 (*)    CO2 15 (*)    Glucose, Bld 136 (*)    BUN 43 (*)    Creatinine, Ser 3.91 (*)    Calcium 7.5 (*)    GFR calc non Af Amer 13 (*)    GFR calc Af Amer 15 (*)    Anion gap 21 (*)    All other components within normal limits  I-STAT CREATININE, ED - Abnormal; Notable for the following components:   Creatinine, Ser 3.80 (*)    All other components within normal limits  POCT I-STAT EG7 - Abnormal; Notable for the following components:   pH, Ven  7.231 (*)    pCO2, Ven 33.1 (*)    pO2, Ven 55.0 (*)    Bicarbonate 13.9 (*)    TCO2 15 (*)    Acid-base deficit 13.0 (*)    Calcium, Ion 0.99 (*)    HCT 35.0 (*)    Hemoglobin 11.9 (*)    All other components within normal limits  CULTURE, BLOOD (ROUTINE X 2)  CULTURE, BLOOD (ROUTINE X 2)  URINE CULTURE  TROPONIN I  URINALYSIS, ROUTINE W REFLEX MICROSCOPIC  CBG MONITORING, ED  CBG MONITORING, ED    EKG EKG Interpretation  Date/Time:  Thursday November 05 2018 12:24:17 EDT Ventricular Rate:  160 PR Interval:    QRS Duration: 109 QT Interval:  305 QTC Calculation: 498 R Axis:   2 Text Interpretation:  Supraventricular tachycardia Ventricular premature complex Low voltage, precordial leads Anteroseptal infarct, old Nonspecific T abnormalities, lateral leads Artifact in lead(s) I II III aVR aVL aVF V2 V4 V5 V6 Confirmed by Aletta Edouard [phone]) on 10/28/2018 12:35:24 PM   Radiology Dg Chest Port 1 View  Result Date: 11/08/2018 CLINICAL DATA:  Weakness this morning.  Former smoker. EXAM: PORTABLE CHEST 1 VIEW COMPARISON:  02/13/2014 FINDINGS: Lungs are adequately inflated without focal airspace consolidation or effusion. Cardiomediastinal silhouette and remainder of the exam is unchanged. IMPRESSION: No active disease. Electronically Signed   By: Marin Olp M.D.   On: 11/04/2018 12:00    Procedures .Critical Care Performed by: Hayden Rasmussen, MD Authorized by: Hayden Rasmussen, MD   Critical care provider statement:    Critical care time (minutes):  45   Critical care time was exclusive of:  Separately billable procedures and treating other patients   Critical care  was necessary to treat or prevent imminent or life-threatening deterioration of the following conditions:  Circulatory failure, metabolic crisis and renal failure   Critical care was time spent personally by me on the following activities:  Discussions with consultants, evaluation of patient's response to  treatment, examination of patient, ordering and performing treatments and interventions, ordering and review of laboratory studies, ordering and review of radiographic studies, pulse oximetry, re-evaluation of patient's condition, obtaining history from patient or surrogate, review of old charts and development of treatment plan with patient or surrogate   I assumed direction of critical care for this patient from another provider in my specialty: no     (including critical care time)  Medications Ordered in ED Medications  diltiazem (CARDIZEM) 1 mg/mL load via infusion 10 mg (10 mg Intravenous Bolus from Bag 11/12/2018 1303)    And  diltiazem (CARDIZEM) 100 mg in dextrose 5% 155mL (1 mg/mL) infusion (5 mg/hr Intravenous New Bag/Given 11/21/2018 1242)  potassium chloride 10 mEq in 100 mL IVPB (has no administration in time range)  potassium chloride SA (K-DUR,KLOR-CON) CR tablet 20 mEq (20 mEq Oral Not Given 11/20/2018 1407)  calcium gluconate 1 g/ 50 mL sodium chloride IVPB (has no administration in time range)  sodium chloride 0.9 % bolus 1,000 mL (0 mLs Intravenous Stopped 11/15/2018 1236)    And  sodium chloride 0.9 % bolus 1,000 mL (0 mLs Intravenous Stopped 11/25/2018 1315)  magnesium sulfate IVPB 2 g 50 mL (0 g Intravenous Stopped 11/17/2018 1421)  0.9 %  sodium chloride infusion (125 mL/hr Intravenous New Bag/Given 11/25/2018 1349)     Initial Impression / Assessment and Plan / ED Course  I have reviewed the triage vital signs and the nursing notes.  Pertinent labs & imaging results that were available during my care of the patient were reviewed by me and considered in my medical decision making (see chart for details).  Clinical Course as of Nov 05 1422  Thu Nov 04, 5921  [id number] 85 year old male with dementia, full code here with decreased responsive episode hypotension and a tachycardia that was cardioverted by EMS.  Currently appears to be in a sinus rhythm although he is having episodes of frequent PVCs  and had one episode with a burst back up to 170 briefly.  Initially he did not have any complaints but now is complaining of some chest pain.  Pressures are still soft.   [MB]  1207 Patient appears to be more like A. fib on the monitor now.  He is bouncing anywhere between 90 and 150.   [MB]  6659 His blood pressure and perfusion are little bit better now.  He is having more episodes of going narrow complex fast probably A. fib although possibly SVT.  Have ordered Cardizem bolus and drip.   [MB]  9357 Patient's lactate is elevated although has no fever here.  I do not think this is sepsis but more likely related to his rapid atrial fibrillation.   [MB]  0177 Patient's labs coming back and he is got a lot of metabolic abnormalities.  He has a new AKI with a creatinine of 3.1.  Potassium is critically low at 2.1.  Calcium is also critically low at 5.7.  I have ordered him IV calcium and IV potassium.  Currently he is on a Cardizem drip for his rapid A. fib and his rates are improved in the low 100s.  Blood pressure also hanging around 100.  Lactate is elevated but I think  this is more hypoperfusion then infection.   [MB]    Clinical Course User Index [MB] Hayden Rasmussen, MD  CHA2DS2/VAS Stroke Risk Points      N/A >= 2 Points: High Risk  1 - 1.99 Points: Medium Risk  0 Points: Low Risk    A final score could not be computed because of missing components.:   Last Change: N/A     This score determines the patient's risk of having a stroke if the  patient has atrial fibrillation.      This score is not applicable to this patient. Components are not  calculated.    CHA2DS2/VAS Stroke Risk Points  Current as of a minute ago     3 >= 2 Points: High Risk  1 - 1.99 Points: Medium Risk  0 Points: Low Risk    The patient's score has not changed in the past year.:  No Change     Details    This score determines the patient's risk of having a stroke if the  patient has atrial fibrillation.        Points Metrics  0 Has Congestive Heart Failure:  No    Current as of a minute ago  0 Has Vascular Disease:  No    Current as of a minute ago  1 Has Hypertension:  Yes    Current as of a minute ago  2 Age:  65    Current as of a minute ago  0 Has Diabetes:  No    Current as of a minute ago  0 Had Stroke:  No  Had TIA:  No  Had thromboembolism:  No    Current as of a minute ago  0 Male:  No    Current as of a minute ago     [name] [name] was evaluated in Emergency Department on 11/08/2018 for the symptoms described in the history of present illness. He was evaluated in the context of the global COVID-19 pandemic, which necessitated consideration that the patient might be at risk for infection with the SARS-CoV-2 virus that causes COVID-19. Institutional protocols and algorithms that pertain to the evaluation of patients at risk for COVID-19 are in a state of rapid change based on information released by regulatory bodies including the CDC and federal and state organizations. These policies and algorithms were followed during the patient's care in the ED.            Final Clinical Impressions(s) / ED Diagnoses   Final diagnoses:  Atrial fibrillation with RVR ([place])  AKI (acute kidney injury) ([place])  Hypokalemia  Hypocalcemia  Hypomagnesemia    ED Discharge Orders    None       Hayden Rasmussen, MD 11/23/2018 1831

## 2018-11-05 NOTE — ED Notes (Signed)
PCXR at bedside.

## 2018-11-05 NOTE — ED Notes (Addendum)
Dr. Maylene Roes contacted with vs . States to continue calcium gluconate and potassium doses. Dr. Maylene Roes also aware of lack of urine output and bladder scan.

## 2018-11-05 NOTE — ED Notes (Signed)
Pulse ox intermittently reads on forehead at 94%

## 2018-11-05 NOTE — ED Notes (Signed)
Pt intermittently noted as tachy at 180. Dr. Melina Copa aware and also aware of lactic.

## 2018-11-05 NOTE — ED Notes (Signed)
Pt given small amount of water and some coughing noted. Will hold po kdur. Dr. Melina Copa aware.

## 2018-11-05 NOTE — H&P (Signed)
History and Physical    Zacharee Gaddie ION:[id number] DOB: 1932/07/02 DOA: 11/24/2018  PCP: [name], MD  Patient coming from: SN[facility]   Chief Complaint: Andre Lefort at facility   HPI: [name] [name] is a 85 y.o. male with medical history significant of dementia, HLD who is a chronic SNF resident. He had low BP this morning and then was found to be unresponsive, cold, clammy. EMS found him in A Fib RVR with hypotension, given 200J cardioversion with improvement in his responsiveness and improvement in HR. In the ED, he was started on cardizem gtt due to frequent PVCs and elevated HR.   Per daughter, at baseline, he can be conversational if he wants but is usually very quiet. When he does talk, he will talk as though he is already in the middle of a conversation and sometimes does not make total sense to the listener. She does not know of any formal dx of dementia or memory loss but he is mostly dependent for most of ADL.    ED Course: Patient was started on Cardizem drip with improvement of his heart rate into the low 100s.  Lab obtained BMP which revealed potassium 2.1, creatinine 3.1, magnesium 1.5, troponin <0.03, lactic acid 3.7. However, repeat lab to potassium supplementation revealed potassium 3.9, creatinine 3.8.  Repeat BMP is pending at time of admission.  Review of Systems: As per HPI otherwise 10 point review of systems negative.   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   reports that he has quit smoking. His smoking use included cigarettes. He has a 2.50 pack-year smoking history. He has never used smokeless tobacco. He reports that he does not drink alcohol or use drugs.  No Known  Allergies  No family history on file.   Prior to Admission medications   Medication Sig Start Date End Date Taking? Authorizing Provider  acetaminophen (TYLENOL) 325 MG tablet Take 650 mg by mouth 2 (two) times daily as needed for mild pain, moderate pain or fever.     [provider]  bisacodyl (DULCOLAX) 10 MG suppository Constipation (2 of 4): If not relieved by MOM, give 10 mg Bisacodyl suppositiory rectally X 1 dose in 24 hours as needed (Do not use constipation standing orders for residents with renal failure/CFR less than 30. Contact MD for orders)    [provider]  geriatric multivitamins-minerals (ELDERTONIC/GEVRABON) LIQD Take 15 mLs by mouth 2 (two) times daily.    [provider]  magnesium hydroxide (MILK OF MAGNESIA) 400 MG/5ML suspension Constipation (1 of 4): If no BM in 3 days, give 30 cc Milk of Magnesium p.o. x 1 dose in 24 hours as needed (Do not use standing constipation orders for residents with renal failure CFR less than 30. Contact MD for orders)    [provider]  Menthol, Topical Analgesic, (BIOFREEZE EX) Apply 1 application topically 3 (three) times daily. Apply to right knee and right hip each shift for pain    [provider]  NUTRITIONAL SUPPLEMENT LIQD Take 120 mLs by mouth 2 (two) times daily. MedPass    [provider]  Nutritional Supplements (NUTRITIONAL  SUPPLEMENT PO) Take 1 each by mouth 2 (two) times daily. Magic Cup    [provider]  rosuvastatin (CRESTOR) 20 MG tablet Take 20 mg by mouth daily.    [provider]  Sodium Phosphates (RA SALINE ENEMA RE) Constipation (3 of 4): If not relieved by Biscodyl suppository, give disposable Saline Enema rectally X 1 dose/24 hrs as needed (Do not use constipation standing orders for residents with renal failure/CFR less than 30. Contact MD for orders)    Constipation (4 of 4): Contact MD as needed if no results from enema (Nursing Measure)     [provider]  vitamin B-12 (CYANOCOBALAMIN) 500 MCG tablet Take 500 mcg by mouth daily.    [provider]    Physical Exam: Vitals:   11/10/2018 1435 11/08/2018 1445 10/28/2018 1450 11/19/2018 1500  BP: (!) 83/46 (!) 79/46 (!) 82/48 (!) 96/50  Pulse: 87 87 88 92  Resp: (!) 28 16 19 17   Temp:      TempSrc:      SpO2: 99% 99% 98% 99%  Weight:      Height:         Constitutional: calm, comfortable, frail and elderly-appearing in no distress Eyes: PERRL, lids and conjunctivae normal ENMT: Mucous membranes are dry Neck: normal, supple, no masses, no thyromegaly Respiratory: clear to auscultation bilaterally, no wheezing, no crackles. Normal respiratory effort. No accessory muscle use.  Cardiovascular: Irregular rhythm, rate 100s Abdomen: no tenderness, no masses palpated. No hepatosplenomegaly. Bowel sounds positive.  Musculoskeletal: no clubbing / cyanosis. No joint deformity upper and lower extremities.  Muscle wasting noted Skin: no rashes, lesions, ulcers on exposed skin Neurologic: Alert but not oriented Psychiatric: History of dementia  Labs on Admission: I have personally reviewed following labs and imaging studies  CBC: Recent Labs  Lab 11/04/2018 1115 10/29/2018 1335  WBC 10.0  --   NEUTROABS 8.1*  --   HGB 10.4* 11.9*  HCT 33.9* 35.0*  MCV 99.7  --   PLT 243  --    Basic Metabolic Panel: Recent Labs  Lab 11/10/2018 1115 11/13/2018 1152 11/13/2018 1335  NA 140  --  136  K 2.1*  --  3.9  CL 110  --   --   CO2 12*  --   --   GLUCOSE 88  --   --   BUN 33*  --   --   CREATININE 3.10*  --  3.80*  CALCIUM 5.7*  --   --   MG  --  1.5*  --    GFR: Estimated Creatinine Clearance: 10 mL/min (A) (by C-G formula based on SCr of 3.8 mg/dL (H)). Liver Function Tests: Recent Labs  Lab 11/08/2018 1115  AST 41  ALT 23  ALKPHOS 64  BILITOT 0.9  PROT 3.7*  ALBUMIN 1.4*   No results for input(s): LIPASE, AMYLASE in the last 168 hours. No results for  input(s): AMMONIA in the last 168 hours. Coagulation Profile: No results for input(s): INR, PROTIME in the last 168 hours. Cardiac Enzymes: Recent Labs  Lab 11/10/2018 1115  TROPONINI <0.03   BNP (last 3 results) No results for input(s): PROBNP in the last 8760 hours. HbA1C: No results for input(s): HGBA1C in the last 72 hours. CBG: Recent Labs  Lab 11/06/2018 1119  GLUCAP 96   Lipid Profile: No results for input(s): CHOL, HDL, LDLCALC, TRIG, CHOLHDL, LDLDIRECT in the last 72 hours. Thyroid Function Tests: Recent Labs    11/26/2018 1119  TSH 4.888*   Anemia Panel: No results for input(s): VITAMINB12, FOLATE, FERRITIN, TIBC, IRON, RETICCTPCT in the last 72 hours. Urine analysis:    Component Value Date/Time   COLORURINE AMBER (A) 02/12/2014 0230   APPEARANCEUR CLOUDY (A) 02/12/2014 0230   LABSPEC 1.026 02/12/2014 0230   PHURINE 5.0 02/12/2014 0230   GLUCOSEU NEGATIVE 02/12/2014 0230   HGBUR MODERATE (A) 02/12/2014 0230   BILIRUBINUR SMALL (A) 02/12/2014 0230   KETONESUR 15 (A) 02/12/2014 0230   PROTEINUR NEGATIVE 02/12/2014 0230   UROBILINOGEN 1.0 02/12/2014 0230   NITRITE NEGATIVE 02/12/2014 0230   LEUKOCYTESUR NEGATIVE 02/12/2014 0230   Sepsis Labs: !!!!!!!!!!!!!!!!!!!!!!!!!!!!!!!!!!!!!!!!!!!! @LABRCNTIP (procalcitonin:4,lacticidven:4) )No results found for this or any previous visit (from the past 240 hour(s)).   Radiological Exams on Admission: Dg Chest Port 1 View  Result Date: 11/16/2018 CLINICAL DATA:  Weakness this morning.  Former smoker. EXAM: PORTABLE CHEST 1 VIEW COMPARISON:  02/13/2014 FINDINGS: Lungs are adequately inflated without focal airspace consolidation or effusion. Cardiomediastinal silhouette and remainder of the exam is unchanged. IMPRESSION: No active disease. Electronically Signed   By: Marin Olp M.D.   On: 11/09/2018 12:00    EKG: Independently reviewed.  SVT with heart rate in the 160s  Assessment/Plan Principal Problem:   Atrial  fibrillation with RVR (HCC) Active Problems:   Dementia without behavioral disturbance (HCC)   AKI (acute kidney injury) (HCC)   Protein-calorie malnutrition, severe (HCC)   Hyperlipidemia   Hypokalemia   Hypomagnesemia   A Fib RVR -S/p emergent cardioversion via EMS due to unresponsiveness and lack of pulse, no CPR administered as patient responded quickly to cardioversion -CHADSVASc 2. Did not start anticoagulation in this chronically debilitated and frail gentleman with dementia -Cardizem gtt as BP tolerates   HLD -Crestor  Hypokalemia -Replaced, repeat BMP pending at time of admission  Hypomagnesium -Replaced, trend  AKI -Check renal US  -IVF   Lactic acidosis  -IVF, trend  Elevated TSH -Check free T4   Dementia  -At baseline, he is somewhat conversational but dependent on most ADLs    DVT prophylaxis: Lovenox Code Status: Full code, had an extensive discussion with daughter over the phone. From our conversation, she believes he would want to remain full code and be resuscitated including CPR, defib, MV. Discussed with her regarding patient's frailty and quality of life, patient's daughter will continue to discuss with the rest of the family members. Family Communication: Spoke with daughter over the phone  Disposition Plan: Pending improvement in his heart rate and BP.  Suspect return back to [facility] once stable Consults called: None Admission status: Inpatient  Severity of Illness: The appropriate patient status for this patient is INPATIENT. Inpatient status is judged to be reasonable and necessary in order to provide the required intensity of service to ensure the patient's safety. The patient's presenting symptoms, physical exam findings, and initial radiographic and laboratory data in the context of their chronic comorbidities is felt to place them at high risk for further clinical deterioration. Furthermore, it is not anticipated that the patient will be  medically stable for discharge from the hospital within 2 midnights of admission. The following factors support the patient status of inpatient.   " The patient's presenting symptoms include unresponsive on presentation, tachycardia and relative hypotension. " The worrisome physical exam findings include overall frailty. " The initial radiographic and laboratory data are worrisome because of metabolic derangements including hypokalemia, hypomagnesemia, AKI. " The chronic co-morbidities include hyperlipidemia and dementia.   * I certify  that at the point of admission it is my clinical judgment that the patient will require inpatient hospital care spanning beyond 2 midnights from the point of admission due to high intensity of service, high risk for further deterioration and high frequency of surveillance required.Dessa Phi, DO [facility] 11/15/2018, 4:06 PM    How to contact the [facility] Attending or Consulting provider [place] or covering provider during after hours [place], for this patient?  1. Check the care team in [facility] and look for a) attending/consulting [facility] provider listed and b) the [facility] team listed 2. Log into [email or web address] and use [place]'s universal password to access. If you do not have the password, please contact the hospital operator. 3. Locate the [facility] provider you are looking for under [facility] and page to a number that you can be directly reached. 4. If you still have difficulty reaching the provider, please page the [facility] (Director on Call) for the Hospitalists listed on amion for assistance.

## 2018-11-05 NOTE — Progress Notes (Signed)
Pt arrived from the Er , transferred to bed , made comfortable , v/s are stable , continues on ivf.

## 2018-11-05 NOTE — ED Notes (Signed)
Pt continues to make intermittent moaning noise.

## 2018-11-05 NOTE — ED Notes (Signed)
BP 82/48, hr 80-90. Discussed withDr. Melina Copa, Will hold calcium at this time and decrease cardezem to 2.5

## 2018-11-05 NOTE — ED Notes (Signed)
Bladder scanned pt due to no urine output. 158ml urine per bladder scan.

## 2018-11-05 NOTE — ED Notes (Signed)
Calcium 5.7, K+ 2.1. EDP made aware.

## 2018-11-05 NOTE — ED Notes (Signed)
Calcium line confirmed with blood withdraw and flush.

## 2018-11-05 NOTE — ED Notes (Signed)
Dr. Ashok Cordia aware of lab results.

## 2018-11-05 NOTE — ED Notes (Signed)
ED TO INPATIENT HANDOFF REPORT  ED Nurse Name and Phone #: [facility] [phone]  S Name/Age/Gender Vladimir Creeks 85 y.o. male Room/Bed: TRAAC/TRAAC  Code Status   Code Status: Prior  Home/SNF/Other Nursing Home Patient oriented to: self Is this baseline? Yes   Triage Complete: Triage complete  Chief Complaint Unresponsive  Triage Note Pt arrives EMS from [facility] with co hypotension, unresponsive and afib RVR with cardioversion PTA at 200 J . Pt becomes more alert after cardioversion. Given 400 cc ns PTA. Pt has some verbal response. Not oriented at basline.   Allergies No Known Allergies  Level of Care/Admitting Diagnosis ED Disposition    ED Disposition Condition Comment   Admit  Hospital Area: [place] [[id number]]  Level of Care: Progressive [102]  Diagnosis: Atrial fibrillation with RVR [facility]) [[id number]]  Admitting Physician: Dessa Phi [[id number]]  Attending Physician: Dessa Phi [phone]  Estimated length of stay: 3 - 4 days  Certification:: I certify this patient will need inpatient services for at least 2 midnights  PT Class (Do Not Modify): Inpatient [101]  PT Acc Code (Do Not Modify): Private [1]       B Medical/Surgery History Past Medical History:  Diagnosis Date  . 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 Cancer ([place])   . Chronic kidney disease   . Dementia ([place])   . Dysphagia   . Fall   . Hypertension   . Rhabdomyolysis   . Symbolic dysfunction    Social impairment   No past surgical history on file.   A IV Location/Drains/Wounds Patient Lines/Drains/Airways Status   Active Line/Drains/Airways    Name:   Placement date:   Placement time:   Site:   Days:   Peripheral IV 11/22/2018 Left Hand   11/16/2018    1109    Hand   less than 1   Peripheral IV 11/24/2018 Left Forearm   11/13/2018    1110    Forearm   less than 1   Peripheral IV 11/04/2018 Left;Upper Forearm   11/13/2018    1428    Forearm   less than 1          Intake/Output Last 24 hours No intake or output data in the 24 hours ending 11/26/2018 1502  Labs/Imaging Results for orders placed or performed during the hospital encounter of 11/09/2018 (from the past 48 hour(s))  Lactic acid, plasma     Status: Abnormal   Collection Time: 11/16/2018 11:15 AM  Result Value Ref Range   Lactic Acid, Venous 3.7 (HH) 0.5 - 1.9 mmol/L    Comment: CRITICAL RESULT CALLED TO, READ BACK BY AND VERIFIED WITH: Margeret Stachnik,M RN @1223  ON [id number] BY FLEMINGS Performed at [facility], [address] [place], [place] [address]   Comprehensive metabolic panel     Status: Abnormal   Collection Time: 11/14/2018 11:15 AM  Result Value Ref Range   Sodium 140 135 - 145 mmol/L   Potassium 2.1 (LL) 3.5 - 5.1 mmol/L    Comment: CRITICAL RESULT CALLED TO, READ BACK BY AND VERIFIED WITH: C.BAIN,RN 1309 11/03/2018 CL[place],S    Chloride 110 98 - 111 mmol/L   CO2 12 (L) 22 - 32 mmol/L   Glucose, Bld 88 70 - 99 mg/dL   BUN 33 (H) 8 - 23 mg/dL   Creatinine, Ser 3.10 (H) 0.61 - 1.24 mg/dL   Calcium 5.7 (LL) 8.9 - 10.3 mg/dL    Comment: CRITICAL RESULT CALLED  TO, READ BACK BY AND VERIFIED WITH: C.BAIN,RN 1309 11/15/2018 CLARK,S    Total Protein 3.7 (L) 6.5 - 8.1 g/dL   Albumin 1.4 (L) 3.5 - 5.0 g/dL   AST 41 15 - 41 U/L   ALT 23 0 - 44 U/L   Alkaline Phosphatase 64 38 - 126 U/L   Total Bilirubin 0.9 0.3 - 1.2 mg/dL   GFR calc non Af Amer 17 (L) >60 mL/min   GFR calc Af Amer 20 (L) >60 mL/min   Anion gap 18 (H) 5 - 15    Comment: Performed at [facility], [place] [address] [place], [place] [address]  CBC WITH DIFFERENTIAL     Status: Abnormal   Collection Time: 10/30/2018 11:15 AM  Result Value Ref Range   WBC 10.0 4.0 - 10.5 K/uL   RBC 3.40 (L) 4.22 - 5.81 MIL/uL   Hemoglobin 10.4 (L) 13.0 - 17.0 g/dL   HCT 33.9 (L) 39.0 - 52.0 %   MCV 99.7 80.0 - 100.0 fL   MCH 30.6 26.0 - 34.0 pg   MCHC 30.7 30.0 - 36.0 g/dL   RDW 13.8 11.5 - 15.5 %   Platelets 243 150 - 400  K/uL   nRBC 0.0 0.0 - 0.2 %   Neutrophils Relative % 83 %   Neutro Abs 8.1 (H) 1.7 - 7.7 K/uL   Lymphocytes Relative 10 %   Lymphs Abs 1.0 0.7 - 4.0 K/uL   Monocytes Relative 6 %   Monocytes Absolute 0.6 0.1 - 1.0 K/uL   Eosinophils Relative 0 %   Eosinophils Absolute 0.0 0.0 - 0.5 K/uL   Basophils Relative 0 %   Basophils Absolute 0.0 0.0 - 0.1 K/uL   Immature Granulocytes 1 %   Abs Immature Granulocytes 0.14 (H) 0.00 - 0.07 K/uL    Comment: Performed at [facility], [address] [place], [place] [address]  Troponin I - Once     Status: None   Collection Time: 11/14/2018 11:15 AM  Result Value Ref Range   Troponin I <0.03 <0.03 ng/mL    Comment: Performed at [facility] [address], [place], [place] [address]  TSH     Status: Abnormal   Collection Time: 11/22/2018 11:19 AM  Result Value Ref Range   TSH 4.888 (H) 0.350 - 4.500 uIU/mL    Comment: Performed by a 3rd Generation assay with a functional sensitivity of <=0.01 uIU/mL. Performed at [facility], [place] [address], [place], [place] [address]   CBG monitoring, ED     Status: None   Collection Time: 10/31/2018 11:19 AM  Result Value Ref Range   Glucose-Capillary 96 70 - 99 mg/dL  Magnesium     Status: Abnormal   Collection Time: 10/29/2018 11:52 AM  Result Value Ref Range   Magnesium 1.5 (L) 1.7 - 2.4 mg/dL    Comment: Performed at [facility] [address], [place], [place] [address]  Lactic acid, plasma     Status: Abnormal   Collection Time: 11/16/2018  1:17 PM  Result Value Ref Range   Lactic Acid, Venous 5.6 (HH) 0.5 - 1.9 mmol/L    Comment: CRITICAL RESULT CALLED TO, READ BACK BY AND VERIFIED WITH: Camelle Henkels,M RN @1405  ON [id number] BY FLEMINGS Performed at [facility] [address] [place], [place] [address]   I-stat Creatinine, ED     Status: Abnormal   Collection Time: 11/06/2018  1:35 PM  Result Value Ref Range   Creatinine, Ser 3.80 (H) 0.61 - 1.24 mg/dL  POCT I-Stat EG7      Status: Abnormal   Collection Time: 11/19/2018  1:35 PM  Result Value Ref Range   pH, Ven 7.231 (L) 7.250 - 7.430   pCO2, Ven 33.1 (L) 44.0 - 60.0 mmHg   pO2, Ven 55.0 (H) 32.0 - 45.0 mmHg   Bicarbonate 13.9 (L) 20.0 - 28.0 mmol/L   TCO2 15 (L) 22 - 32 mmol/L   O2 Saturation 83.0 %   Acid-base deficit 13.0 (H) 0.0 - 2.0 mmol/L   Sodium 136 135 - 145 mmol/L   Potassium 3.9 3.5 - 5.1 mmol/L   Calcium, Ion 0.99 (L) 1.15 - 1.40 mmol/L   HCT 35.0 (L) 39.0 - 52.0 %   Hemoglobin 11.9 (L) 13.0 - 17.0 g/dL   Patient temperature HIDE    Sample type VENOUS    Dg Chest Port 1 View  Result Date: 11/10/2018 CLINICAL DATA:  Weakness this morning.  Former smoker. EXAM: PORTABLE CHEST 1 VIEW COMPARISON:  02/13/2014 FINDINGS: Lungs are adequately inflated without focal airspace consolidation or effusion. Cardiomediastinal silhouette and remainder of the exam is unchanged. IMPRESSION: No active disease. Electronically Signed   By: Marin Olp M.D.   On: 11/23/2018 12:00    Pending Labs Unresulted Labs (From admission, onward)    Start     Ordered   10/28/2018 7622  Basic metabolic panel  Once,   STAT     11/04/2018 1346   11/18/2018 1118  Urine culture  ONCE - STAT,   STAT     11/16/2018 1118   11/09/2018 1117  Blood Culture (routine x 2)  BLOOD CULTURE X 2,   STAT     11/21/2018 1118   11/03/2018 1117  Urinalysis, Routine w reflex microscopic  ONCE - STAT,   STAT     11/24/2018 1118   Signed and Held  CBC  (heparin)  Once,   R    Comments:  Baseline for heparin therapy IF NOT ALREADY DRAWN.  Notify MD if PLT < 100 K.    Signed and Held   Signed and Held  Creatinine, serum  (heparin)  Once,   R    Comments:  Baseline for heparin therapy IF NOT ALREADY DRAWN.    Signed and Held   Signed and Held  CBC  Tomorrow morning,   R     Signed and Held   Signed and Held  Basic metabolic panel  Tomorrow morning,   R     Signed and Held   Signed and Held  Magnesium  Tomorrow morning,   R     Signed and Held   Signed  and Held  Lactic acid, plasma  STAT Now then every 3 hours,   R     Signed and Held   Signed and Held  T4, free  Once,   R     Signed and Held          Vitals/Pain Today's Vitals   11/20/2018 1421 11/21/2018 1431 11/24/2018 1435 11/15/2018 1450  BP: (!) 69/41 (!) 80/40 (!) 83/46 (!) 82/48  Pulse: (!) 41 (!) 48 87 88  Resp: 18 (!) 34 (!) 28 19  Temp:      TempSrc:      SpO2: 98% (!) 88% 99% 98%  Weight:      Height:        Isolation Precautions No active isolations  Medications Medications  diltiazem (CARDIZEM) 1 mg/mL load via infusion 10 mg (10 mg Intravenous Bolus from Bag 10/30/2018 1303)    And  diltiazem (CARDIZEM) 100 mg in dextrose 5% 145mL (1 mg/mL) infusion (2.5 mg/hr Intravenous Rate/Dose Change 11/06/2018 1459)  potassium chloride 10 mEq in 100 mL IVPB (10 mEq Intravenous New Bag/Given 11/22/2018 1435)  potassium chloride SA (K-DUR,KLOR-CON) CR tablet 20 mEq (20 mEq Oral Not Given 11/02/2018 1407)  calcium gluconate 1 g/ 50 mL sodium chloride IVPB (has no administration in time range)  sodium chloride 0.9 % bolus 1,000 mL (0 mLs Intravenous Stopped 11/17/2018 1236)    And  sodium chloride 0.9 % bolus 1,000 mL (0 mLs Intravenous Stopped 10/31/2018 1315)  magnesium sulfate IVPB 2 g 50 mL (0 g Intravenous Stopped 11/08/2018 1421)  0.9 %  sodium chloride infusion (125 mL/hr Intravenous New Bag/Given 11/04/2018 1349)    Mobility non-ambulatory High fall risk   Focused Assessments Cardiac Assessment Handoff:  Cardiac Rhythm: Atrial fibrillation Lab Results  Component Value Date   CKTOTAL 4,562 (H) 12/13/2013   CKMB 2.1 09/30/2009   TROPONINI <0.03 11/18/2018   No results found for: DDIMER Does the Patient currently have chest pain? Yes     R Recommendations: See Admitting Provider Note  Report given to:   Additional Notes: pain intermiuttent.

## 2018-11-05 NOTE — ED Notes (Signed)
[name] [name] request I speak with Dr. Maylene Roes to address the bp and pt condition. Same done.

## 2018-11-05 NOTE — ED Notes (Signed)
Attempted to call report

## 2018-11-05 NOTE — ED Triage Notes (Signed)
Pt arrives EMS from [place] [facility] with co hypotension, unresponsive and afib RVR with cardioversion PTA at 200 J . Pt becomes more alert after cardioversion. Given 400 cc ns PTA. Pt has some verbal response. Not oriented at basline.

## 2018-11-05 NOTE — Progress Notes (Addendum)
   After my evaluation, I was notified by RN regarding patient's lower blood pressure.  Blood pressure has ranged as low as 69/41, up to 96/50.  His heart rate is much better in the 90s.  Cardizem rate has been reduced.  Discussed with PCCM, they recommend another IV fluid bolus to bring up his blood pressure.   Dessa Phi, DO [facility] [email or web address] 10/30/2018, 4:07 PM    BP improved to 108/53, HR 90s. Mentation about the same as previous. Patient to be admitted to progressive unit as planned. Repeat BMP shows K 2.8 and Cr 3.91. Will continue to replace K and continue IVF NS.   Dessa Phi, DO [facility] [email or web address] 11/02/2018, 5:33 PM

## 2018-11-05 NOTE — ED Notes (Signed)
Attempted to pass I/O cath for sample but unable to pass prostate. Blood noted at meatus after attempt. Will in form md. Will place condom cath for specimen.

## 2018-11-05 NOTE — Progress Notes (Signed)
Location:   [facility] Room Number: 222A Place of Service:  SNF [phone]) Provider:  Loraine Maple, MD  Patient Care Team: [name], MD as PCP - General (Internal Medicine) Rehab, [facility] ([place])  Extended Emergency Contact Information Primary Emergency Contact: Stirn,[name] Address: [place], [place] [address] [name] of [place] Phone: [phone] Relation: Daughter Secondary Emergency Contact: KHANG, HANNUM Mobile Phone: [phone] Relation: Other  Code Status:  CPR Goals of care: Advanced Directive information Advanced Directives 10/21/2018  Does Patient Have a Medical Advance Directive? Yes  Type of Advance Directive (No Data)  Does patient want to make changes to medical advance directive? No - Patient declined  Would patient like information on creating a medical advance directive? -  Pre-existing out of facility DNR order (yellow form or pink MOST form) -     Chief Complaint  Patient presents with  . Acute Visit    Change in status      HPI:  Pt is a 85 y.o. male seen today for an acute visit for acute change in status...  He is a long term resident of facility with a history of hypertension-protein calorie malnutrition dyslipidemia B12 deficiency history of CVA with dysphagia as well as dementia and osteoarthritis  Apparently patient was at his baseline- appears at times will run a low systolic blood pressure in the 90s per chart review.   Today nursing staff noted his blood pressure was running somewhat low apparently by machine they got a systolic in the 44'Q-- said he appeared to be weak but in no distress.  He was alert and talking.  I do note he is not on any antihypertensive medication   However when I saw him in the room several minutes later he had a precipitous change he was not really speaking --doing mouth breathing.  I did do a manual  blood pressure and got 68/40.  Pulse was largely regular-  Per nursing this was a precipitous change in a matter of minutes--EMS was called   Blood sugar was taken and was in the low 100s however O2 saturation was difficult to obtain secondary to his skin being somewhat clammy and cold  EMS promptly arrived and oxygen was administered again he continued to have open mouth breathing is not really responding  His eyes are open but he does not really make eye contact   .    Past Medical History:  Diagnosis Date  . 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 Cancer ([place])   . Chronic kidney disease   . Dementia ([place])   . Dysphagia   . Fall   . Hypertension   . Rhabdomyolysis   . Symbolic dysfunction    Social impairment   History reviewed. No pertinent surgical history.  No Known Allergies  Facility-Administered Encounter Medications as of 11/13/2018  Medication  . sodium chloride 0.9 % bolus 1,000 mL   And  . sodium chloride 0.9 % bolus 1,000 mL   Outpatient Encounter Medications as of 11/18/2018  Medication Sig  . acetaminophen (TYLENOL) 325 MG tablet Take 650 mg by mouth 2 (two) times daily as needed for mild pain, moderate pain or fever.   . bisacodyl (DULCOLAX) 10 MG suppository Constipation (2 of 4): If not relieved by MOM, give 10 mg Bisacodyl suppositiory rectally X 1 dose in 24 hours  as needed (Do not use constipation standing orders for residents with renal failure/CFR less than 30. Contact MD for orders)  . geriatric multivitamins-minerals (ELDERTONIC/GEVRABON) LIQD Take 15 mLs by mouth 2 (two) times daily.  . magnesium hydroxide (MILK OF MAGNESIA) 400 MG/5ML suspension Constipation (1 of 4): If no BM in 3 days, give 30 cc Milk of Magnesium p.o. x 1 dose in 24 hours as needed (Do not use standing constipation orders for residents with renal failure CFR less than 30. Contact MD for orders)  . Menthol, Topical Analgesic, (BIOFREEZE EX) Apply 1  application topically 3 (three) times daily. Apply to right knee and right hip each shift for pain  . NUTRITIONAL SUPPLEMENT LIQD Take 120 mLs by mouth 2 (two) times daily. MedPass  . Nutritional Supplements (NUTRITIONAL SUPPLEMENT PO) Take 1 each by mouth 2 (two) times daily. Magic Cup  . rosuvastatin (CRESTOR) 20 MG tablet Take 20 mg by mouth daily.  . Sodium Phosphates (RA SALINE ENEMA RE) Constipation (3 of 4): If not relieved by Biscodyl suppository, give disposable Saline Enema rectally X 1 dose/24 hrs as needed (Do not use constipation standing orders for residents with renal failure/CFR less than 30. Contact MD for orders)    Constipation (4 of 4): Contact MD as needed if no results from enema (Nursing Measure)  . vitamin B-12 (CYANOCOBALAMIN) 500 MCG tablet Take 500 mcg by mouth daily.    Review of Systems   Unattainable please see HPI  Immunization History  Administered Date(s) Administered  . Influenza Split 05/08/2011, 05/07/2012  . Influenza Whole 04/22/2008, 05/25/2009  . Influenza-Unspecified 05/02/2016, 05/15/2017, 06/01/2018  . PPD Test 12/15/2013, 01/16/2015, 02/15/2015  . Pneumococcal Polysaccharide-23 04/10/2010, 12/15/2013  . Td 04/10/2010   Pertinent  Health Maintenance Due  Topic Date Due  . PNA vac Low Risk Adult (2 of 2 - PCV13) 10/13/2043 (Originally 12/16/2014)  . INFLUENZA VACCINE  02/27/2019   Fall Risk  02/24/2018 02/20/2017 08/30/2016 04/12/2016 03/22/2016  Falls in the past year? No No No No No  Risk for fall due to : - - - - -   Functional Status Survey:  Temperature  is pending- pulse initially was 76- respirations were in the mid 20s blood pressure taken manually was 68/40  Oxygen saturation was difficult to obtain  Blood sugar was in the low 100s   Body mass index is 16.57 kg/m. Physical Exam  In general this is a  frail-appearing elderly male he is mouth breathing--not really responding but does have his eyes open  His skin is somewhat cold  and clammy  Chest has slightly labored breathing- with open mouth breathing- does not appear to be in acute distress  but is a significant  change per nursing in a matter of minutes   Heart initially was largely regular rate and rhythm although was difficult to auscultate on reexam -- on reexam radial  Palpitation of pulse  appear to be somewhat thready   Abdomen is soft he does appear to grunt when it is palpated-- bowel sounds are active    Musculoskeletal difficult to fully assess since he is not following verbal commands he does appear to have significant frailty  Neurologic as noted above he is not really responding but when abdomen is palpated he does grunt- he does have his eyes open but does not really make eye contact   Labs reviewed: Recent Labs    11/28/17 08/27/18  NA 139 142  K 4.2 4.5  BUN 17 21  CREATININE 0.5* 0.6   Recent Labs    11/28/17 08/27/18  AST 12* 17  ALT 5* 11  ALKPHOS 79 77   Recent Labs    08/27/18  WBC 7.7  NEUTROABS 5  HGB 13.0*  HCT 38*  PLT 198   Lab Results  Component Value Date   TSH 4.43 10/29/2017   No results found for: HGBA1C Lab Results  Component Value Date   CHOL 135 05/27/2018   HDL 33 (A) 05/27/2018   LDLCALC 89 05/27/2018   TRIG 68 05/27/2018   CHOLHDL 5.3 09/10/2013    Significant Diagnostic Results in last 30 days:  No results found.  Assessment/Plan  .  #1 acute change in status- patient is under full CODE STATUS and we will send him to the ER for expedient evaluation as noted above.   CPT -[place], [place]

## 2018-11-06 ENCOUNTER — Inpatient Hospital Stay (HOSPITAL_COMMUNITY): Payer: Medicare Other

## 2018-11-06 DIAGNOSIS — E43 Unspecified severe protein-calorie malnutrition: Secondary | ICD-10-CM

## 2018-11-06 DIAGNOSIS — E876 Hypokalemia: Secondary | ICD-10-CM

## 2018-11-06 DIAGNOSIS — N179 Acute kidney failure, unspecified: Secondary | ICD-10-CM

## 2018-11-06 DIAGNOSIS — F0150 Vascular dementia without behavioral disturbance: Secondary | ICD-10-CM

## 2018-11-06 LAB — BASIC METABOLIC PANEL
Anion gap: 20 — ABNORMAL HIGH (ref 5–15)
BUN: 44 mg/dL — ABNORMAL HIGH (ref 8–23)
CO2: 14 mmol/L — ABNORMAL LOW (ref 22–32)
Calcium: 7.7 mg/dL — ABNORMAL LOW (ref 8.9–10.3)
Chloride: 103 mmol/L (ref 98–111)
Creatinine, Ser: 3.95 mg/dL — ABNORMAL HIGH (ref 0.61–1.24)
GFR calc Af Amer: 15 mL/min — ABNORMAL LOW (ref >60)
GFR calc non Af Amer: 13 mL/min — ABNORMAL LOW (ref >60)
Glucose, Bld: 106 mg/dL — ABNORMAL HIGH (ref 70–99)
Potassium: 3.1 mmol/L — ABNORMAL LOW (ref 3.5–5.1)
Sodium: 137 mmol/L (ref 135–145)

## 2018-11-06 LAB — URINALYSIS, ROUTINE W REFLEX MICROSCOPIC

## 2018-11-06 LAB — LACTIC ACID, PLASMA: Lactic Acid, Venous: 1.9 mmol/L (ref 0.5–1.9)

## 2018-11-06 LAB — CBC
HCT: 37.3 % — ABNORMAL LOW (ref 39.0–52.0)
Hemoglobin: 12 g/dL — ABNORMAL LOW (ref 13.0–17.0)
MCH: 30.4 pg (ref 26.0–34.0)
MCHC: 32.2 g/dL (ref 30.0–36.0)
MCV: 94.4 fL (ref 80.0–100.0)
Platelets: 248 10*3/uL (ref 150–400)
RBC: 3.95 MIL/uL — ABNORMAL LOW (ref 4.22–5.81)
RDW: 14 % (ref 11.5–15.5)
WBC: 15 10*3/uL — ABNORMAL HIGH (ref 4.0–10.5)
nRBC: 0 % (ref 0.0–0.2)

## 2018-11-06 LAB — URINALYSIS, MICROSCOPIC (REFLEX): RBC / HPF: >50 RBC/hpf (ref 0–5)

## 2018-11-06 LAB — MAGNESIUM: Magnesium: 2.6 mg/dL — ABNORMAL HIGH (ref 1.7–2.4)

## 2018-11-06 MED ORDER — METOPROLOL TARTRATE 5 MG/5ML IV SOLN: 5.0000 mg | Freq: Four times a day (QID) | INTRAVENOUS | Status: DC | PRN

## 2018-11-06 MED ORDER — OSMOLITE 1.2 CAL PO LIQD
1000.0000 mL | ORAL | Status: DC
  Filled 2018-11-06 (×2): qty 1000

## 2018-11-06 NOTE — Progress Notes (Signed)
Cortrak Tube Team Note:  Consult received to place a Cortrak feeding tube.   RD attempted multiple times to place feeding tube. Pt unable to tilt head or re-position given contractures. Per Cortrak monitor the tube was unable to pass through GE junction into the stomach. Hospitalist made aware, plan for diagnostic radiology placement this afternoon.   Mariana Single RD, LDN Clinical Nutrition Pager # [phone]

## 2018-11-06 NOTE — Progress Notes (Addendum)
PROGRESS NOTE    [name] [name]  GGY:[id number]  DOB: 1932-04-12  DOA: 10/31/2018 PCP: [name], MD  Brief Narrative: 85 y.o. male with medical history of dementia, HLD who resides at [facility] skilled nursing facility sent to the ED for evaluation after being found unresponsive.EMS found him in A Fib RVR with hypotension, given 200J cardioversion with improvement in his responsiveness and improvement in HR. In the ED, he was started on cardizem gtt due to frequent PVCs and elevated HR. lab work in the ED  revealed potassium 2.1, creatinine 3.1, magnesium 1.5, troponin <0.03, lactic acid 3.7 and creatinine at 3.8.  Shortly after admission on IV Cardizem drip, patient developed hypotension requiring IV fluid bolus.  Subjective:  Patient apparently does have disorganized speech at baseline.  He was seen by speech therapy today and recommended n.p.o. as he was noted to be aspirating with all food consistencies.He remains on Cardizem drip at only 2.5 mg/hr with heart rate in 70s to 80s.  He remains on continuous IV fluids at a reduced rate of 03/J and systolic blood pressure in the low 100s.  Patient appears alert but not very communicative.  Has contractures in extremities.  Objective: Vitals:   11/15/2018 2332 11/06/18 0403 11/06/18 0725 11/06/18 1103  BP: (!) 89/54 (!) 100/49 (!) 104/50 (!) 101/47  Pulse: 82 79 79 79  Resp: 20 (!) 21 (!) 21 (!) 28  Temp: 98.3 F (36.8 C) 97.8 F (36.6 C) 98 F (36.7 C) 97.9 F (36.6 C)  TempSrc: Axillary Axillary Oral Oral  SpO2: 96% 98% 99% 100%  Weight:      Height:        Intake/Output Summary (Last 24 hours) at 11/06/2018 1156 Last data filed at 11/06/2018 1100 Gross per 24 hour  Intake 1151.46 ml  Output 0 ml  Net 1151.46 ml   Filed Weights   11/03/2018 1124 11/06/2018 1852  Weight: 49.9 kg 56.6 kg    Physical Examination:  General exam: Appears calm and comfortable  Respiratory system: Clear to auscultation. Respiratory effort  normal. Cardiovascular system: S1 & S2 heard,irregular . No JVD, murmurs, rubs, gallops or clicks. No pedal edema. Gastrointestinal system: Abdomen is nondistended, soft and nontender. No organomegaly or masses felt. Normal bowel sounds heard. Central nervous system: Alert and disoriented, not very communicative today. No focal neurological deficits. Extremities: Contracted upper and lower extremities Skin: Chronic dermatitis in lower extremities Psychiatry: Judgement and insight appear normal. Mood & affect appropriate.     Data Reviewed: I have personally reviewed following labs and imaging studies  CBC: Recent Labs  Lab 11/06/2018 1115 11/26/2018 1335 11/06/18 0858  WBC 10.0  --  15.0*  NEUTROABS 8.1*  --   --   HGB 10.4* 11.9* 12.0*  HCT 33.9* 35.0* 37.3*  MCV 99.7  --  94.4  PLT 243  --  009   Basic Metabolic Panel: Recent Labs  Lab 11/26/2018 1115 11/09/2018 1152 11/19/2018 1335 11/15/2018 1605 11/06/18 0858  NA 140  --  136 136 137  K 2.1*  --  3.9 2.8* 3.1*  CL 110  --   --  100 103  CO2 12*  --   --  15* 14*  GLUCOSE 88  --   --  136* 106*  BUN 33*  --   --  43* 44*  CREATININE 3.10*  --  3.80* 3.91* 3.95*  CALCIUM 5.7*  --   --  7.5* 7.7*  MG  --  1.5*  --   --  2.6*   GFR: Estimated Creatinine Clearance: 10.9 mL/min (A) (by C-G formula based on SCr of 3.95 mg/dL (H)). Liver Function Tests: Recent Labs  Lab 11/11/2018 1115  AST 41  ALT 23  ALKPHOS 64  BILITOT 0.9  PROT 3.7*  ALBUMIN 1.4*   No results for input(s): LIPASE, AMYLASE in the last 168 hours. No results for input(s): AMMONIA in the last 168 hours. Coagulation Profile: No results for input(s): INR, PROTIME in the last 168 hours. Cardiac Enzymes: Recent Labs  Lab 11/19/2018 1115  TROPONINI <0.03   BNP (last 3 results) No results for input(s): PROBNP in the last 8760 hours. HbA1C: No results for input(s): HGBA1C in the last 72 hours. CBG: Recent Labs  Lab 11/06/2018 1119  GLUCAP 96   Lipid  Profile: No results for input(s): CHOL, HDL, LDLCALC, TRIG, CHOLHDL, LDLDIRECT in the last 72 hours. Thyroid Function Tests: Recent Labs    11/21/2018 1119 11/06/2018 2140  TSH 4.888*  --   FREET4  --  1.27   Anemia Panel: No results for input(s): VITAMINB12, FOLATE, FERRITIN, TIBC, IRON, RETICCTPCT in the last 72 hours. Sepsis Labs: Recent Labs  Lab 11/06/2018 1115 11/21/2018 1317 11/21/2018 2140 11/06/18 0120  LATICACIDVEN 3.7* 5.6* 2.6* 1.9    Recent Results (from the past 240 hour(s))  MRSA PCR Screening     Status: None   Collection Time: 11/26/2018  6:56 PM  Result Value Ref Range Status   MRSA by PCR NEGATIVE NEGATIVE Final    Comment:        The GeneXpert MRSA Assay (FDA approved for NASAL specimens only), is one component of a comprehensive MRSA colonization surveillance program. It is not intended to diagnose MRSA infection nor to guide or monitor treatment for MRSA infections. Performed at [facility], [place] [address], [place], [place] [address]       Radiology Studies: US Renal  Result Date: 11/13/2018 CLINICAL DATA:  Initial evaluation for acute renal injury. EXAM: RENAL / URINARY TRACT ULTRASOUND COMPLETE COMPARISON:  None available. FINDINGS: Right Kidney: Renal measurements: 10.1 x 4.7 x 5.1 cm = volume: 120.6 mL . Echogenicity within normal limits. No mass or hydronephrosis visualized. Left Kidney: Renal measurements: 10.1 x 5.4 x 4.9 cm = volume: 143.5 mL. Echogenicity within normal limits. Mild prominence of the renal pyramids noted. No mass or hydronephrosis visualized. Bladder: Bladder partially distended with layering echogenic material within the bladder lumen, indeterminate. IMPRESSION: 1. Layering echogenic material within the bladder lumen, indeterminate, but could reflect debris and/or blood products. Correlation with urinalysis recommended. 2. Otherwise negative renal ultrasound.  No hydronephrosis. Electronically Signed   By: Jeannine Boga  M.D.   On: 11/02/2018 21:29   Dg Chest Port 1 View  Result Date: 11/18/2018 CLINICAL DATA:  Weakness this morning.  Former smoker. EXAM: PORTABLE CHEST 1 VIEW COMPARISON:  02/13/2014 FINDINGS: Lungs are adequately inflated without focal airspace consolidation or effusion. Cardiomediastinal silhouette and remainder of the exam is unchanged. IMPRESSION: No active disease. Electronically Signed   By: Marin Olp M.D.   On: 11/25/2018 12:00        Scheduled Meds:  heparin  5,000 Units Subcutaneous Q8H   rosuvastatin  20 mg Oral Daily   Continuous Infusions:  sodium chloride 75 mL/hr at 11/12/2018 2030   sodium chloride 100 mL/hr (11/20/2018 1743)   diltiazem (CARDIZEM) infusion 2 mg/hr (11/06/18 1011)    Assessment & Plan:    1.  A. fib with RVR resulting in loss  of consciousness: Present on admission.  Status post cardioversion by EMS and remains on IV Cardizem drip.  Patient with electrolyte abnormality on presentation and received IV calcium gluconate/potassium replacement.  TSH mildly elevated but free T4 within normal limits.  Troponin less than 0.03 on presentation.  Magnesium level improved from 1.5-2.6 today.  Potassium still low at 3.1.  Replace IV given n.p.o. status.  2.  Hypocalcemia: Total calcium and ionized calcium on presentation was 5.7 and 0.99 respectively.  Received IV calcium gluconate overnight.  Calcium level improved to 7.5.  Repeat labs in a.m.  Check vitamin D and PTH levels.  Albumin level 1.4 on presentation and total protein at 3.7.  3.  Hypomagnesemia/hypokalemia: Replace as needed.?  Related to malnutrition.  Albumin level significantly low at 1.4  4.  Hypertension: Patient was hypotensive yesterday and now improved with IV fluids.  All antihypertensives on hold.  Plan to transition to oral Cardizem and DC IV Cardizem drip if possible.  5.  Frequent PVCs: Given problem #1, hypertension and PVCs, will obtain echo to evaluate for new cardiomyopathies.  6.   Dysphagia: Seen by speech therapy and recommended n.p.o. status given high aspiration risk with all food consistencies.  Will place NG tube for the weekend and discussed with family regarding care goals.  7.  Elevated TSH: Normal free T4.  Will check free T3.  Likely subclinical hypothyroidism.Repeat labs in 3 weeks and follow-up PCP.    8.Dementia: Likely at baseline currently.  Can resume home p.o. medications when able to take oral.  9. Severe protein calorie malnutrition:?  Related to problem #6 and problem #8.  Not sure if patient lost weight recently.  Hypercalcemia could be causing appetite issues.  Will discuss with family regarding care goals.  DVT prophylaxis: Heparin Code Status: Full code Family / Patient Communication: Plan to discuss with daughter later today and consider palliative care consult Disposition Plan: Back to skilled nursing facility when medically cleared.  Addendum: tried calling all 3 numbers (for daughters Madison Hickman and [facility]) but no answer/no messaging system. Unsuccessful attempt by nursing to place NG tube/Cortrak. IR consult requested.     LOS: 1 day    Time spent: 35 minutes    Guilford Shi, MD [facility] Pager [phone]  If 7PM-7AM, please contact night-coverage [email or web address] Password [facility] 11/06/2018, 11:56 AM

## 2018-11-06 NOTE — Progress Notes (Signed)
Initial Nutrition Assessment  RD working remotely.  DOCUMENTATION CODES:   Underweight, suspect malnutrition given underweight status and significant weight loss but unable to confirm at this time without NFPE  INTERVENTION:   Monitor magnesium, potassium, and phosphorus daily for at least 3 days, MD to replete as needed, as pt is at risk for refeeding syndrome.  Once Cortrak placed: - Initiate Osmolite 1.2 @ 20 ml/hr and increase by 10 ml q 8 hours until goal rate of 60 ml/hr is reached (1440 ml/day) - Free water per MD  Tube feeding regimen at goal rate provides 1728 kcal, 80 grams of protein, and 1181 ml of H2O (100% of needs).  NUTRITION DIAGNOSIS:   Inadequate oral intake related to inability to eat as evidenced by NPO status.  GOAL:   Patient will meet greater than or equal to 90% of their needs  MONITOR:   Diet advancement, TF tolerance, Labs, Weight trends  REASON FOR ASSESSMENT:   Consult Enteral/tube feeding initiation and management, Assessment of nutrition requirement/status  ASSESSMENT:   85 year old male who presented to the ED on 4/09 from [facility] with hypotension and unresponsiveness. Pt became more alert after cardioversion. PMH of dementia, HLD, CKD, cancer, HTN.  Pt did not pass SLP swallow evaluation this morning. Order placed for Cortrak and consult received to initiate and manage TF. Discussed with attending MD over the phone. Per MD, pt is full code and family wants everything done at this time. Plan is to proceed with Cortrak placement.  Reviewed weight history in chart. Pt with 3.6 kg weight loss since 10/21/18. This is a 6% weight loss in less than 1 month which is significant for timeframe. Overall there is a downward trend over the last year.  Given weight loss and likely malnutrition, pt is at refeeding risk. Recommend monitoring magnesium, potassium, and phosphorus daily for at least 3 days, MD to replete as needed.  Medications reviewed and  include: heparin, Cardizem drip  Labs reviewed: potassium 3.1 (L), BUN 44 (H), creaitnine 3.95 (H), magnesium 2.6 (H)  NUTRITION - FOCUSED PHYSICAL EXAM:  Unable to complete at this time. RD working remotely.  Diet Order:   Diet Order            Diet NPO time specified  Diet effective now              EDUCATION NEEDS:   Not appropriate for education at this time  Skin:  Skin Assessment: Reviewed RN Assessment (wound to right toe)  Last BM:  11/06/2018  Height:   Ht Readings from Last 1 Encounters:  11/14/2018 5\' 11"  (1.803 m)    Weight:   Wt Readings from Last 1 Encounters:  11/08/2018 56.6 kg    Ideal Body Weight:  78.2 kg  BMI:  Body mass index is 17.4 kg/m.  Estimated Nutritional Needs:   Kcal:  1550-1750  Protein:  70-85 grams  Fluid:  >/= 1.5 L    [name] Face, MS, RD, LDN Inpatient Clinical Dietitian Pager: [phone] Weekend/After Hours: [phone]

## 2018-11-06 NOTE — Evaluation (Signed)
Clinical/Bedside Swallow Evaluation Patient Details  Name: [name] [name] MRN: [id number] Date of Birth: 1931-10-02  Today's Date: 11/06/2018 Time: SLP Start Time (ACUTE ONLY): 0931 SLP Stop Time (ACUTE ONLY): 0948 SLP Time Calculation (min) (ACUTE ONLY): 17 min  Past Medical History:  Past Medical History:  Diagnosis Date  . 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 Cancer ([place])   . Chronic kidney disease   . Dementia ([place])   . Dysphagia   . Fall   . Hypertension   . Rhabdomyolysis   . Symbolic dysfunction    Social impairment   Past Surgical History: No past surgical history on file. HPI:  Pt is an 85 yo male admitted from SNF with unresponsiveness, found to be in a fib RVR now s/p cardioversion. CXR on admission clear. Previous BSE in 2015 recommended Dys 1 solids, thin liquids but advanced to Dys 3 after MBS. PMH: HTN, fall, dysphagia, dementia, CVA, CKD   Assessment / Plan / Recommendation Clinical Impression   Pt appears to be at a high risk for aspiration and poor nutrition regardless of consistency. Pt orally accepts boluses but does not seal his lips. Mod cues were provided to initiate a swallow, with limited hyolaryngeal movement to palpation. Pt has immediate throat clearing and "hocking" that begins after trials of thin liquids. He does not have as much audible wetness or coughing with purees, but upon inspection of his mouth, the majority of each bolus remains pooled in his anterior sulcus. Total A provided for removal. Recommend that he remain NPO with emphasis on oral care for now. Would set up oral suction in his room. Will f/u for potential to resume PO diet, although baseline diet at SNF not entirely clear.  SLP Visit Diagnosis: Dysphagia, oropharyngeal phase (R13.12)    Aspiration Risk  Moderate aspiration risk;Severe aspiration risk    Diet Recommendation NPO   Medication Administration: Via alternative means    Other  Recommendations Oral  Care Recommendations: Oral care QID Other Recommendations: Have oral suction available   Follow up Recommendations Skilled Nursing facility      Frequency and Duration min 2x/week  2 weeks       Prognosis Prognosis for Safe Diet Advancement: Fair Barriers to Reach Goals: Cognitive deficits;Severity of deficits      Swallow Study   General HPI: Pt is an 85 yo male admitted from SNF with unresponsiveness, found to be in a fib RVR now s/p cardioversion. CXR on admission clear. Previous BSE in 2015 recommended Dys 1 solids, thin liquids but advanced to Dys 3 after MBS. PMH: HTN, fall, dysphagia, dementia, CVA, CKD Type of Study: Bedside Swallow Evaluation Previous Swallow Assessment: see HPI Diet Prior to this Study: NPO Temperature Spikes Noted: No Respiratory Status: Room air History of Recent Intubation: No Behavior/Cognition: Alert;Requires cueing Oral Cavity Assessment: Dry Oral Care Completed by SLP: No Oral Cavity - Dentition: Edentulous Vision: Functional for self-feeding Self-Feeding Abilities: Needs assist Patient Positioning: Upright in bed Baseline Vocal Quality: Normal Volitional Swallow: Unable to elicit    Oral/Motor/Sensory Function Overall Oral Motor/Sensory Function: (not following commands for assessment)   Ice Chips Ice chips: Not tested   Thin Liquid Thin Liquid: Impaired Presentation: Cup;Self Fed;Spoon;Straw Oral Phase Impairments: Reduced labial seal Pharyngeal  Phase Impairments: Suspected delayed Swallow;Decreased hyoid-laryngeal movement;Wet Vocal Quality;Throat Clearing - Immediate;Cough - Delayed    Nectar Thick Nectar Thick Liquid: Not tested   Honey Thick Honey Thick Liquid: Not tested   Puree  Puree: Impaired Presentation: Spoon Oral Phase Impairments: Reduced labial seal;Reduced lingual movement/coordination;Poor awareness of bolus Oral Phase Functional Implications: Oral residue Pharyngeal Phase Impairments: Suspected delayed  Swallow;Decreased hyoid-laryngeal movement   Solid     Solid: Not tested      [name] [name] 11/06/2018,9:56 AM  Pollyann Glen, M.A. [place] Acute Environmental education officer [phone] Office [phone]

## 2018-11-07 ENCOUNTER — Inpatient Hospital Stay (HOSPITAL_COMMUNITY): Payer: Medicare Other

## 2018-11-07 DIAGNOSIS — F0390 Unspecified dementia without behavioral disturbance: Secondary | ICD-10-CM

## 2018-11-07 DIAGNOSIS — R1310 Dysphagia, unspecified: Secondary | ICD-10-CM

## 2018-11-07 DIAGNOSIS — R532 Functional quadriplegia: Secondary | ICD-10-CM

## 2018-11-07 DIAGNOSIS — E7849 Other hyperlipidemia: Secondary | ICD-10-CM

## 2018-11-07 DIAGNOSIS — J90 Pleural effusion, not elsewhere classified: Secondary | ICD-10-CM | POA: Diagnosis present

## 2018-11-07 LAB — URINE CULTURE: Culture: NO GROWTH

## 2018-11-07 LAB — BASIC METABOLIC PANEL
Anion gap: 21 — ABNORMAL HIGH (ref 5–15)
BUN: 47 mg/dL — ABNORMAL HIGH (ref 8–23)
CO2: 10 mmol/L — ABNORMAL LOW (ref 22–32)
Calcium: 7.3 mg/dL — ABNORMAL LOW (ref 8.9–10.3)
Chloride: 107 mmol/L (ref 98–111)
Creatinine, Ser: 4.28 mg/dL — ABNORMAL HIGH (ref 0.61–1.24)
GFR calc Af Amer: 14 mL/min — ABNORMAL LOW (ref >60)
GFR calc non Af Amer: 12 mL/min — ABNORMAL LOW (ref >60)
Glucose, Bld: 98 mg/dL (ref 70–99)
Potassium: 3.6 mmol/L (ref 3.5–5.1)
Sodium: 138 mmol/L (ref 135–145)

## 2018-11-07 LAB — ECHOCARDIOGRAM LIMITED
Height: 71 in
Weight: 1996.49 oz

## 2018-11-07 LAB — T3, FREE: T3, Free: 0.9 pg/mL — ABNORMAL LOW (ref 2.0–4.4)

## 2018-11-07 MED ORDER — SODIUM CHLORIDE 0.9 % IV BOLUS
1000.0000 mL | Freq: Once | INTRAVENOUS | Status: AC
  Administered 2018-11-07: 12:00:00 1000 mL via INTRAVENOUS

## 2018-11-07 MED ORDER — SODIUM CHLORIDE 0.9 % IV BOLUS
500.0000 mL | Freq: Once | INTRAVENOUS | Status: AC
  Administered 2018-11-07: 12:00:00 500 mL via INTRAVENOUS

## 2018-11-07 MED ORDER — DOPAMINE-DEXTROSE 3.2-5 MG/ML-% IV SOLN
10.0000 ug/kg/min | INTRAVENOUS | Status: DC
  Filled 2018-11-07: qty 250

## 2018-11-08 LAB — PARATHYROID HORMONE, INTACT (NO CA): PTH: 149 pg/mL — ABNORMAL HIGH (ref 15–65)

## 2018-11-09 LAB — VITAMIN D 25 HYDROXY (VIT D DEFICIENCY, FRACTURES): Vit D, 25-Hydroxy: 8.4 ng/mL — ABNORMAL LOW (ref 30.0–100.0)

## 2018-11-10 LAB — CULTURE, BLOOD (ROUTINE X 2)
Culture: NO GROWTH
Culture: NO GROWTH
Special Requests: ADEQUATE

## 2018-11-27 NOTE — Progress Notes (Signed)
Pt's daughter [name] called and updated her regarding plan of care and meanwhile paged MD to call her back on her request  Palma Holter, RN

## 2018-11-27 NOTE — Progress Notes (Signed)
11.50 am- Rapid response and MD is in bed side Current BP 56/36, MD changed code status to DNI So far 500 cc bolus given Pt is still getting bolus. Waiting for the dopamin to start (medicine not in the pyxis),

## 2018-11-27 NOTE — Death Summary Note (Signed)
Death Summary  [name] [name] UEA:[id number] DOB: [birth year] DOA: Nov 21, 2018  PCP: [name], MD  Admit date: 11/21/18 Date of Death: 11/23/18 Time of Death: 10/24/22 PM Notification: [name], MD notified of death of 11-23-2018   History of present illness:  [name] [name] is a 85 y.o. male with a history of dementia, HLD who resides at [facility] sent to the ED on Nov 21, 2018 for evaluation after being found unresponsive at the facility.EMS found himin A Fib RVR with hypotension, given 200J cardioversion with improvement in his responsiveness and improvement inHR. In the ED, he was started on cardizem gtt due to frequent PVCs and elevated HR. lab work in the ED  revealed potassium 2.1, creatinine 3.1, magnesium 1.5, troponin <0.03,lactic acid 3.7 and creatinine at 3.8.  Shortly after admission on IV Cardizem drip, patient developed hypotension requiring IV fluid bolus.He was also noted to have significant electrolyte abnormalities including hypocalcemia, hypomagnesemia, hypokalemia which were all replaced intravenously. Patient apparently does have disorganized speech at baseline. He wasn't very communicative during the hospital stay.  He was seen by speech therapy and recommended n.p.o. as he was noted to be aspirating with all food consistencies.Cortrak was planned for temporary feeding and medications while we attempt palliative care discussions with family. He was maintained on IV fluids while titrating cardizem drip to HR. I was able to reach daughter [name] [name] ( Primary health care proxy) this morning (although couldn't reach family yesterday) . Patient multiple medical issues including stroke risk, inability to anticoagulate, recurrent arrhythmic potential, worsening pleural effusions with the setting of severe malnutrition /hypoalbuminemia , futility of long term feeding tubes was discussed.    While I was on the phone with daughter [name] [name], I was paged by primary RN that patients BP  dropped to systolic 47/82. Cardizem drip d/ced and IV fluid bolus initiated. Daughter initially stated okay for DNI but wanted to discuss with her sister/niece before she made him DNR. On my arrival bedside , BP still low at systolic 95A. Ordered IV dopamine while fluid bolus going. Daughter called back confirming DNR status and also wished comfort measures if he doesn't respond to IV fluids and continued to decline. She didnt want pressors/ICU transfer understanding risks outweighed benefits. Patient deteriorated quickly while on IV fluids and monitor showed Asystole. Patient on my assessment with no spontaneous movements, pupils dilated ,non reactive and without heart sounds on ausculation. He was pronounced dead at 12.20pm. I called daughter and informed her of patients death as well as comfortable end of life for him. She appreciated my call and care provided.    Final Diagnoses:   Principal Problem:   Atrial fibrillation with RVR (HCC) Active Problems:   Dementia without behavioral disturbance (HCC)   AKI (acute kidney injury) (HCC)   Protein-calorie malnutrition, severe (HCC)   Hyperlipidemia   Hypokalemia   Hypomagnesemia   Hypocalcemia   Pleural effusions   Functional quadriplegia   The results of significant diagnostics from this hospitalization (including imaging, microbiology, ancillary and laboratory) are listed below for reference.    Significant Diagnostic Studies: Ct Abdomen Wo Contrast  Result Date: 11/06/2018 CLINICAL DATA:  Preop for gastrostomy tube placement EXAM: CT ABDOMEN WITHOUT CONTRAST TECHNIQUE: Multidetector CT imaging of the abdomen was performed following the standard protocol without IV contrast. COMPARISON:  None. FINDINGS: Lower chest: Moderate bilateral pleural effusions right greater than left. There is associated dependent atelectasis. Hepatobiliary: The left lobe of the liver is atrophic. Right lobe is unremarkable. The gallbladder is  distended and  contains gallstones. No obvious gallbladder wall thickening. Pancreas: Atrophic. Spleen: Unremarkable Adrenals/Urinary Tract: Kidneys and adrenal glands are within normal limits. Stomach/Bowel: The body of the stomach is inferior to the left lobe of the liver and adjacent to the transverse colon. It is decompressed. No obvious mass in the colon. Small bowel is decompressed. Vascular/Lymphatic: Aortic atherosclerotic vascular calcifications. No abnormal retroperitoneal adenopathy. Other: No free fluid. Musculoskeletal: No vertebral compression deformity. IMPRESSION: Preop gastrostomy anatomy is acceptable for gastrostomy tube attempt. The stomach is decompressed and adjacent to the transverse colon. Hopefully, distention of the stomach will provide an adequate target. Bilateral pleural effusions. Cholelithiasis. Electronically Signed   By: Marybelle Killings M.D.   On: 11/06/2018 15:46   US Renal  Result Date: 11/02/2018 CLINICAL DATA:  Initial evaluation for acute renal injury. EXAM: RENAL / URINARY TRACT ULTRASOUND COMPLETE COMPARISON:  None available. FINDINGS: Right Kidney: Renal measurements: 10.1 x 4.7 x 5.1 cm = volume: 120.6 mL . Echogenicity within normal limits. No mass or hydronephrosis visualized. Left Kidney: Renal measurements: 10.1 x 5.4 x 4.9 cm = volume: 143.5 mL. Echogenicity within normal limits. Mild prominence of the renal pyramids noted. No mass or hydronephrosis visualized. Bladder: Bladder partially distended with layering echogenic material within the bladder lumen, indeterminate. IMPRESSION: 1. Layering echogenic material within the bladder lumen, indeterminate, but could reflect debris and/or blood products. Correlation with urinalysis recommended. 2. Otherwise negative renal ultrasound.  No hydronephrosis. Electronically Signed   By: Jeannine Boga M.D.   On: 11/12/2018 21:29   Dg Chest Port 1 View  Result Date: 11/25/2018 CLINICAL DATA:  Weakness this morning.  Former smoker. EXAM:  PORTABLE CHEST 1 VIEW COMPARISON:  02/13/2014 FINDINGS: Lungs are adequately inflated without focal airspace consolidation or effusion. Cardiomediastinal silhouette and remainder of the exam is unchanged. IMPRESSION: No active disease. Electronically Signed   By: Marin Olp M.D.   On: 11/06/2018 12:00    Microbiology: Recent Results (from the past 240 hour(s))  Blood Culture (routine x 2)     Status: None (Preliminary result)   Collection Time: 11/08/2018 11:15 AM  Result Value Ref Range Status   Specimen Description BLOOD LEFT HAND  Final   Special Requests   Final    BOTTLES DRAWN AEROBIC AND ANAEROBIC Blood Culture adequate volume   Culture   Final    NO GROWTH 2 DAYS Performed at [facility], [address] [place], [place] [address]    Report Status PENDING  Incomplete  Urine culture     Status: None   Collection Time: 11/22/2018 11:18 AM  Result Value Ref Range Status   Specimen Description URINE, RANDOM  Final   Special Requests NONE  Final   Culture   Final    NO GROWTH Performed at [facility], [address] [place], [place] [address]    Report Status 11-20-18 FINAL  Final  Blood Culture (routine x 2)     Status: None (Preliminary result)   Collection Time: 11/11/2018 11:43 AM  Result Value Ref Range Status   Specimen Description BLOOD RIGHT HAND  Final   Special Requests   Final    BOTTLES DRAWN AEROBIC AND ANAEROBIC Blood Culture results may not be optimal due to an inadequate volume of blood received in culture bottles   Culture   Final    NO GROWTH 2 DAYS Performed at [facility], [place] [address] [place], [place] [address]    Report Status  PENDING  Incomplete  MRSA PCR Screening     Status: None   Collection Time: 11/01/2018  6:56 PM  Result Value Ref Range Status   MRSA by PCR NEGATIVE NEGATIVE Final    Comment:        The GeneXpert MRSA Assay (FDA approved for NASAL specimens only), is one component of a comprehensive MRSA colonization  surveillance program. It is not intended to diagnose MRSA infection nor to guide or monitor treatment for MRSA infections. Performed at [facility], [place] [address] [place], [place] [address]      Labs: Basic Metabolic Panel: Recent Labs  Lab 11/04/2018 1115 10/28/2018 1152 11/12/2018 1335 11/21/2018 1605 11/06/18 0858 11/30/18 0155  NA 140  --  136 136 137 138  K 2.1*  --  3.9 2.8* 3.1* 3.6  CL 110  --   --  100 103 107  CO2 12*  --   --  15* 14* 10*  GLUCOSE 88  --   --  136* 106* 98  BUN 33*  --   --  43* 44* 47*  CREATININE 3.10*  --  3.80* 3.91* 3.95* 4.28*  CALCIUM 5.7*  --   --  7.5* 7.7* 7.3*  MG  --  1.5*  --   --  2.6*  --    Liver Function Tests: Recent Labs  Lab 11/01/2018 1115  AST 41  ALT 23  ALKPHOS 64  BILITOT 0.9  PROT 3.7*  ALBUMIN 1.4*   No results for input(s): LIPASE, AMYLASE in the last 168 hours. No results for input(s): AMMONIA in the last 168 hours. CBC: Recent Labs  Lab 11/17/2018 1115 11/16/2018 1335 11/06/18 0858  WBC 10.0  --  15.0*  NEUTROABS 8.1*  --   --   HGB 10.4* 11.9* 12.0*  HCT 33.9* 35.0* 37.3*  MCV 99.7  --  94.4  PLT 243  --  248   Cardiac Enzymes: Recent Labs  Lab 11/24/2018 1115  TROPONINI <0.03   D-Dimer No results for input(s): DDIMER in the last 72 hours. BNP: Invalid input(s): POCBNP CBG: Recent Labs  Lab 11/06/2018 1119  GLUCAP 96   Anemia work up No results for input(s): VITAMINB12, FOLATE, FERRITIN, TIBC, IRON, RETICCTPCT in the last 72 hours. Urinalysis    Component Value Date/Time   COLORURINE RED (A) 11/06/2018 1001   APPEARANCEUR TURBID (A) 11/06/2018 1001   LABSPEC  11/06/2018 1001    TEST NOT REPORTED DUE TO COLOR INTERFERENCE OF URINE PIGMENT   PHURINE  11/06/2018 1001    TEST NOT REPORTED DUE TO COLOR INTERFERENCE OF URINE PIGMENT   GLUCOSEU (A) 11/06/2018 1001    TEST NOT REPORTED DUE TO COLOR INTERFERENCE OF URINE PIGMENT   HGBUR (A) 11/06/2018 1001    TEST NOT REPORTED DUE TO COLOR  INTERFERENCE OF URINE PIGMENT   BILIRUBINUR (A) 11/06/2018 1001    TEST NOT REPORTED DUE TO COLOR INTERFERENCE OF URINE PIGMENT   KETONESUR (A) 11/06/2018 1001    TEST NOT REPORTED DUE TO COLOR INTERFERENCE OF URINE PIGMENT   PROTEINUR (A) 11/06/2018 1001    TEST NOT REPORTED DUE TO COLOR INTERFERENCE OF URINE PIGMENT   UROBILINOGEN 1.0 02/12/2014 0230   NITRITE (A) 11/06/2018 1001    TEST NOT REPORTED DUE TO COLOR INTERFERENCE OF URINE PIGMENT   LEUKOCYTESUR (A) 11/06/2018 1001    TEST NOT REPORTED DUE TO COLOR INTERFERENCE OF URINE PIGMENT   Sepsis Labs Invalid input(s): PROCALCITONIN,  WBC,  LACTICIDVEN     SIGNED:  Guilford Shi, MD  [facility] 11-27-18, 12:35 PM Pager   If 7PM-7AM, please contact night-coverage [email or web address] Password [email or web address]

## 2018-11-27 NOTE — Progress Notes (Signed)
Appreciate the help of the charge nurse, for post mortem care, calling family member to inquire about the funeral house and calling to the security and taking body to the [facility].  Palma Holter, RN

## 2018-11-27 NOTE — Progress Notes (Addendum)
PROGRESS NOTE    [name] [name]  PXT:[id number]  DOB: 02/19/1932  DOA: 11/11/2018 PCP: [name], MD  Brief Narrative: 85 y.o. male with medical history of dementia, HLD who resides at [facility] sent to the ED for evaluation after being found unresponsive.EMS found him in A Fib RVR with hypotension, given 200J cardioversion with improvement in his responsiveness and improvement in HR. In the ED, he was started on cardizem gtt due to frequent PVCs and elevated HR. lab work in the ED  revealed potassium 2.1, creatinine 3.1, magnesium 1.5, troponin <0.03, lactic acid 3.7 and creatinine at 3.8.  Shortly after admission on IV Cardizem drip, patient developed hypotension requiring IV fluid bolus.Patient apparently does have disorganized speech at baseline.  He was seen by speech therapy and recommended n.p.o. as he was noted to be aspirating with all food consistencies.  Subjective:  Failed attempts of bedside Cortrak placement. He remains on Cardizem drip at only 1.5 mg/hr with heart rate in 70s to 80s.  He remains on continuous IV fluids at a reduced rate of 62/V and systolic blood pressure 03J to 120s.  Patient appears awake but not  communicative.  Has contractures in extremities.  Objective: Vitals:   11-22-2018 0758 22-Nov-2018 0800 22-Nov-2018 0900 November 22, 2018 1000  BP: (!) 124/93     Pulse: 89   (!) 30  Resp: (!) 22 19 (!) 30 (!) 26  Temp: 97.7 F (36.5 C)     TempSrc: Oral     SpO2: 96%   (!) 64%  Weight:      Height:        Intake/Output Summary (Last 24 hours) at 11-22-2018 1100 Last data filed at 11/22/2018 0900 Gross per 24 hour  Intake 852.9 ml  Output 76 ml  Net 776.9 ml   Filed Weights   11/03/2018 1124 10/31/2018 1852  Weight: 49.9 kg 56.6 kg    Physical Examination:  General exam: Appears calm and comfortable  Respiratory system: Clear to auscultation. Respiratory effort normal. Cardiovascular system: S1 & S2 heard,irregular . No JVD, murmurs, rubs, gallops or clicks.  No pedal edema. Gastrointestinal system: Abdomen is nondistended, soft and ?nontender (moans to lower abd palpation). No organomegaly or masses felt. Normal bowel sounds heard. Central nervous system: Alert and  not very communicative. Functional quadriplegia Extremities: Contracted upper and lower extremities Skin: Chronic dermatitis in lower extremities Psychiatry: non verbal, could not assess      Data Reviewed: I have personally reviewed following labs and imaging studies  CBC: Recent Labs  Lab 11/02/2018 1115 11/06/2018 1335 11/06/18 0858  WBC 10.0  --  15.0*  NEUTROABS 8.1*  --   --   HGB 10.4* 11.9* 12.0*  HCT 33.9* 35.0* 37.3*  MCV 99.7  --  94.4  PLT 243  --  009   Basic Metabolic Panel: Recent Labs  Lab 11/06/2018 1115 11/25/2018 1152 10/29/2018 1335 11/18/2018 1605 11/06/18 0858 Nov 22, 2018 0155  NA 140  --  136 136 137 138  K 2.1*  --  3.9 2.8* 3.1* 3.6  CL 110  --   --  100 103 107  CO2 12*  --   --  15* 14* 10*  GLUCOSE 88  --   --  136* 106* 98  BUN 33*  --   --  43* 44* 47*  CREATININE 3.10*  --  3.80* 3.91* 3.95* 4.28*  CALCIUM 5.7*  --   --  7.5* 7.7* 7.3*  MG  --  1.5*  --   --  2.6*  --    GFR: Estimated Creatinine Clearance: 10.1 mL/min (A) (by C-G formula based on SCr of 4.28 mg/dL (H)). Liver Function Tests: Recent Labs  Lab 11/17/2018 1115  AST 41  ALT 23  ALKPHOS 64  BILITOT 0.9  PROT 3.7*  ALBUMIN 1.4*   No results for input(s): LIPASE, AMYLASE in the last 168 hours. No results for input(s): AMMONIA in the last 168 hours. Coagulation Profile: No results for input(s): INR, PROTIME in the last 168 hours. Cardiac Enzymes: Recent Labs  Lab 11/18/2018 1115  TROPONINI <0.03   BNP (last 3 results) No results for input(s): PROBNP in the last 8760 hours. HbA1C: No results for input(s): HGBA1C in the last 72 hours. CBG: Recent Labs  Lab 11/16/2018 1119  GLUCAP 96   Lipid Profile: No results for input(s): CHOL, HDL, LDLCALC, TRIG, CHOLHDL,  LDLDIRECT in the last 72 hours. Thyroid Function Tests: Recent Labs    10/28/2018 1119 11/12/2018 2140 11/06/18 1434  TSH 4.888*  --   --   FREET4  --  1.27  --   T3FREE  --   --  0.9*   Anemia Panel: No results for input(s): VITAMINB12, FOLATE, FERRITIN, TIBC, IRON, RETICCTPCT in the last 72 hours. Sepsis Labs: Recent Labs  Lab 11/04/2018 1115 11/24/2018 1317 11/12/2018 2140 11/06/18 0120  LATICACIDVEN 3.7* 5.6* 2.6* 1.9    Recent Results (from the past 240 hour(s))  Blood Culture (routine x 2)     Status: None (Preliminary result)   Collection Time: 11/04/2018 11:15 AM  Result Value Ref Range Status   Specimen Description BLOOD LEFT HAND  Final   Special Requests   Final    BOTTLES DRAWN AEROBIC AND ANAEROBIC Blood Culture adequate volume   Culture   Final    NO GROWTH 2 DAYS Performed at [facility], [address], [place], [place] [address]    Report Status PENDING  Incomplete  Urine culture     Status: None   Collection Time: 11/21/2018 11:18 AM  Result Value Ref Range Status   Specimen Description URINE, RANDOM  Final   Special Requests NONE  Final   Culture   Final    NO GROWTH Performed at [facility], [address], [place], [place] [address]    Report Status 13-Nov-2018 FINAL  Final  Blood Culture (routine x 2)     Status: None (Preliminary result)   Collection Time: 11/19/2018 11:43 AM  Result Value Ref Range Status   Specimen Description BLOOD RIGHT HAND  Final   Special Requests   Final    BOTTLES DRAWN AEROBIC AND ANAEROBIC Blood Culture results may not be optimal due to an inadequate volume of blood received in culture bottles   Culture   Final    NO GROWTH 2 DAYS Performed at [facility], [place] [address] [place], [place] [address]    Report Status PENDING  Incomplete  MRSA PCR Screening     Status: None   Collection Time: 11/06/2018  6:56 PM  Result Value Ref Range Status   MRSA by PCR NEGATIVE NEGATIVE Final    Comment:        The  GeneXpert MRSA Assay (FDA approved for NASAL specimens only), is one component of a comprehensive MRSA colonization surveillance program. It is not intended to diagnose MRSA infection nor to guide or monitor treatment for MRSA infections. Performed at [facility], [place] [address] [place], [place] [address]  Radiology Studies: Ct Abdomen Wo Contrast  Result Date: 11/06/2018 CLINICAL DATA:  Preop for gastrostomy tube placement EXAM: CT ABDOMEN WITHOUT CONTRAST TECHNIQUE: Multidetector CT imaging of the abdomen was performed following the standard protocol without IV contrast. COMPARISON:  None. FINDINGS: Lower chest: Moderate bilateral pleural effusions right greater than left. There is associated dependent atelectasis. Hepatobiliary: The left lobe of the liver is atrophic. Right lobe is unremarkable. The gallbladder is distended and contains gallstones. No obvious gallbladder wall thickening. Pancreas: Atrophic. Spleen: Unremarkable Adrenals/Urinary Tract: Kidneys and adrenal glands are within normal limits. Stomach/Bowel: The body of the stomach is inferior to the left lobe of the liver and adjacent to the transverse colon. It is decompressed. No obvious mass in the colon. Small bowel is decompressed. Vascular/Lymphatic: Aortic atherosclerotic vascular calcifications. No abnormal retroperitoneal adenopathy. Other: No free fluid. Musculoskeletal: No vertebral compression deformity. IMPRESSION: Preop gastrostomy anatomy is acceptable for gastrostomy tube attempt. The stomach is decompressed and adjacent to the transverse colon. Hopefully, distention of the stomach will provide an adequate target. Bilateral pleural effusions. Cholelithiasis. Electronically Signed   By: Marybelle Killings M.D.   On: 11/06/2018 15:46   US Renal  Result Date: 11/06/2018 CLINICAL DATA:  Initial evaluation for acute renal injury. EXAM: RENAL / URINARY TRACT ULTRASOUND COMPLETE COMPARISON:  None available. FINDINGS:  Right Kidney: Renal measurements: 10.1 x 4.7 x 5.1 cm = volume: 120.6 mL . Echogenicity within normal limits. No mass or hydronephrosis visualized. Left Kidney: Renal measurements: 10.1 x 5.4 x 4.9 cm = volume: 143.5 mL. Echogenicity within normal limits. Mild prominence of the renal pyramids noted. No mass or hydronephrosis visualized. Bladder: Bladder partially distended with layering echogenic material within the bladder lumen, indeterminate. IMPRESSION: 1. Layering echogenic material within the bladder lumen, indeterminate, but could reflect debris and/or blood products. Correlation with urinalysis recommended. 2. Otherwise negative renal ultrasound.  No hydronephrosis. Electronically Signed   By: Jeannine Boga M.D.   On: 11/15/2018 21:29   Dg Chest Port 1 View  Result Date: 10/30/2018 CLINICAL DATA:  Weakness this morning.  Former smoker. EXAM: PORTABLE CHEST 1 VIEW COMPARISON:  02/13/2014 FINDINGS: Lungs are adequately inflated without focal airspace consolidation or effusion. Cardiomediastinal silhouette and remainder of the exam is unchanged. IMPRESSION: No active disease. Electronically Signed   By: Marin Olp M.D.   On: 11/18/2018 12:00        Scheduled Meds:  heparin  5,000 Units Subcutaneous Q8H   rosuvastatin  20 mg Oral Daily   Continuous Infusions:  sodium chloride 1,000 mL (2018-11-23 0433)   sodium chloride 100 mL/hr (11/17/2018 1743)   diltiazem (CARDIZEM) infusion 1.5 mg/hr (11/06/18 2029)   feeding supplement (OSMOLITE 1.2 CAL)      Assessment & Plan:    1.  A. fib with RVR resulting in loss of consciousness: Present on admission.  Status post cardioversion by EMS and remains on IV Cardizem drip.  Patient with electrolyte abnormality on presentation and received IV calcium gluconate/potassium replacement.  TSH mildly elevated but free T4 within normal limits.  Troponin less than 0.03 on presentation.  Magnesium level improved from 1.5-2.6 today.  Potassium  still low at 3.1.  Replace IV given n.p.o. status.  2.  Hypocalcemia: Total calcium and ionized calcium on presentation was 5.7 and 0.99 respectively.  Received IV calcium gluconate overnight.  Calcium level improved to 7.3.  Albumin level 1.4 on presentation and total protein at 3.7 (Corrected calcium wnl)  Repeat labs in a.m.  Check vitamin D  and PTH levels.   3.  Hypomagnesemia/hypokalemia: Replace as needed.?  Related to malnutrition.  Albumin level significantly low at 1.4  4.  Bilateral pleural effusions: Noted on CT done through IR. Likely due to hypoalbuminemia/IV fluids. Plan to d/c fluids if able to feed through NG tube. Saturating well on RA. Reduce IV fluid rate further.   5.  Frequent PVCs: Given problem #1, hypotension and PVCs, ordered echo to evaluate for new cardiomyopathies.  6.  Dysphagia: Seen by speech therapy and recommended n.p.o. status given high aspiration risk with all food consistencies.  Planned for NG tube for the weekend until we can discuss with family regarding care goals. Failed attempts with bedside Cortrak placement, requested IR assistance. Noted CT abd imaging performed by IR for possible PEG due to miscommunication. Called and clarified with IR PA. CT abd reported not abnormalities except distended GB with gallstones , no wall thickening or pericholecystic fluid.   7.  Elevated TSH: Normal free T4, low T3.  Likely subclinical hypothyroidism.Repeat labs in 3 weeks and follow-up PCP.    8.Dementia: Likely at baseline currently.  Can resume home p.o. medications when able to take oral.  9. Hypertension: Patient was hypotensive and now improved with IV fluids.  All antihypertensives on hold.  Plan to transition to oral Cardizem and DC IV Cardizem drip if possible.  10. Severe protein calorie malnutrition:?  Related to problem #6 and problem #8.  Not sure if patient lost weight recently.  Hypercalcemia could be causing appetite issues. Palliative care consult to   discuss with family regarding care goals. I tried calling all 3 numbers (for daughters Madison Hickman and Roque Lias) yesterday but no answer/no messaging system.  DVT prophylaxis: Heparin Code Status: Full code. Poor prognosis.  Family / Patient Communication: I Called family again and d/w daughter [name], explained above medical issues and poor prognosis. She is okay with holding intubation but wants CPR/resuscitative measures for now Disposition Plan: Back to skilled nursing facility when medically cleared/goals of care clarified.    LOS: 2 days    Time spent: 35 minutes  Addendum: While I was on the phone with daughter [name], I was paged by primary RN that patients BP dropped to systolic 19/14. Cardizem drip d/ced and IV fluid bolus initiated. Daughter as mentioned above stated okay for DNI but wanted to discuss with her sister/niece before she made him DNR. On my arrival bedside , BP still low at systolic 78G. Ordered IV dopamine while fluid bolus going. Daughter called back confirming DNR status and also wished comfort measures if he doesn't respond to IV fluids and continued to decline. She would like her father to get IV fluids for today if tolerated without respiratory distress but doesn't want pressors/ICU transfer understanding risks outweighed benefits. Neb treatments prn and IV fluids for now. Daughter understands that her father might pass away today.   Guilford Shi, MD [facility] Pager [phone]  If 7PM-7AM, please contact night-coverage [email or web address] Password [email or web address] 11/30/2018, 11:00 AM

## 2018-11-27 NOTE — Progress Notes (Signed)
Another 1 L bolus is going on, latest BP 58/38,  Pt's code status is comfort care now Requested MD for Morphine.  No urine output, bladder scan shows 0 cc, will continue to monitor the patient  Palma Holter, RN

## 2018-11-27 NOTE — Progress Notes (Signed)
At 12.16pm,  pt's HR is 0  MD is in bed side  Palma Holter, Therapist, sports

## 2018-11-27 NOTE — Progress Notes (Signed)
SLP Cancellation Note  Patient Details Name: [name] [name] MRN: [id number] DOB: [birth year]   Cancelled treatment:       Reason Eval/Treat Not Completed: Medical issues which prohibited therapy. Pt BP 60/38, RN aware. Will hold therapy at this time, follow up next date for reassessment. Per chart review note cortrak placement attempted and unsuccessful.   Deneise Lever, [place], CCC-SLP Speech-Language Pathologist [facility] Pager: [phone] Office: [phone]    Aliene Altes Nov 29, 2018, 11:33 AM

## 2018-11-27 NOTE — Progress Notes (Signed)
  Echocardiogram 2D Echocardiogram has been performed.  [name] [name] 05-Dec-2018, 11:28 AM

## 2018-11-27 NOTE — Significant Event (Signed)
Rapid Response Event Note  Overview:  RN called for SBP 60, waiting for MD to return page, NS bolus started    Initial Focused Assessment: On my arrival MD to bedside, NS bolus infusing, MD talking with daughter to confirm code status. Daughter spoke with family and decision made for comfort care only. Will give fluids for now. In the event pt becomes in distress MD will add Morphine pushes PRN    Advised RN to page RRT as needed. No intervention from RRT at this time.   Event Summary:   at      at          [facility], Sela Hua

## 2018-11-27 NOTE — Progress Notes (Signed)
  At 11.22 BP  60/38  MD informed bolus started prior  11.40am- called Rapid response  11.45am-  Cardizem hold  11.48: Md called back, she called family  BP 64/40

## 2018-11-27 NOTE — Progress Notes (Signed)
Patient ID: [name] [name], male   DOB: 07/09/1932, 85 y.o.   MRN: [id number]   IR G tube placement request received CT has been read as Approved anatomy for procedure   I will discuss with IR Rad Monday as to timing of this procedure.  Percutaneous gastric tube placement is considered a high risk aerosolized procedure Under new COVID regulations  Will have a plan for placement as soon as possible

## 2018-11-27 DEATH — deceased

## 2019-07-18 IMAGING — DX PORTABLE CHEST - 1 VIEW
1 series · 1 of 1 positions shown · non-contrast
Comparison: 02/13/2014

CLINICAL DATA: Weakness this morning.  Former smoker.

EXAM:
PORTABLE CHEST 1 VIEW

[chest ap]
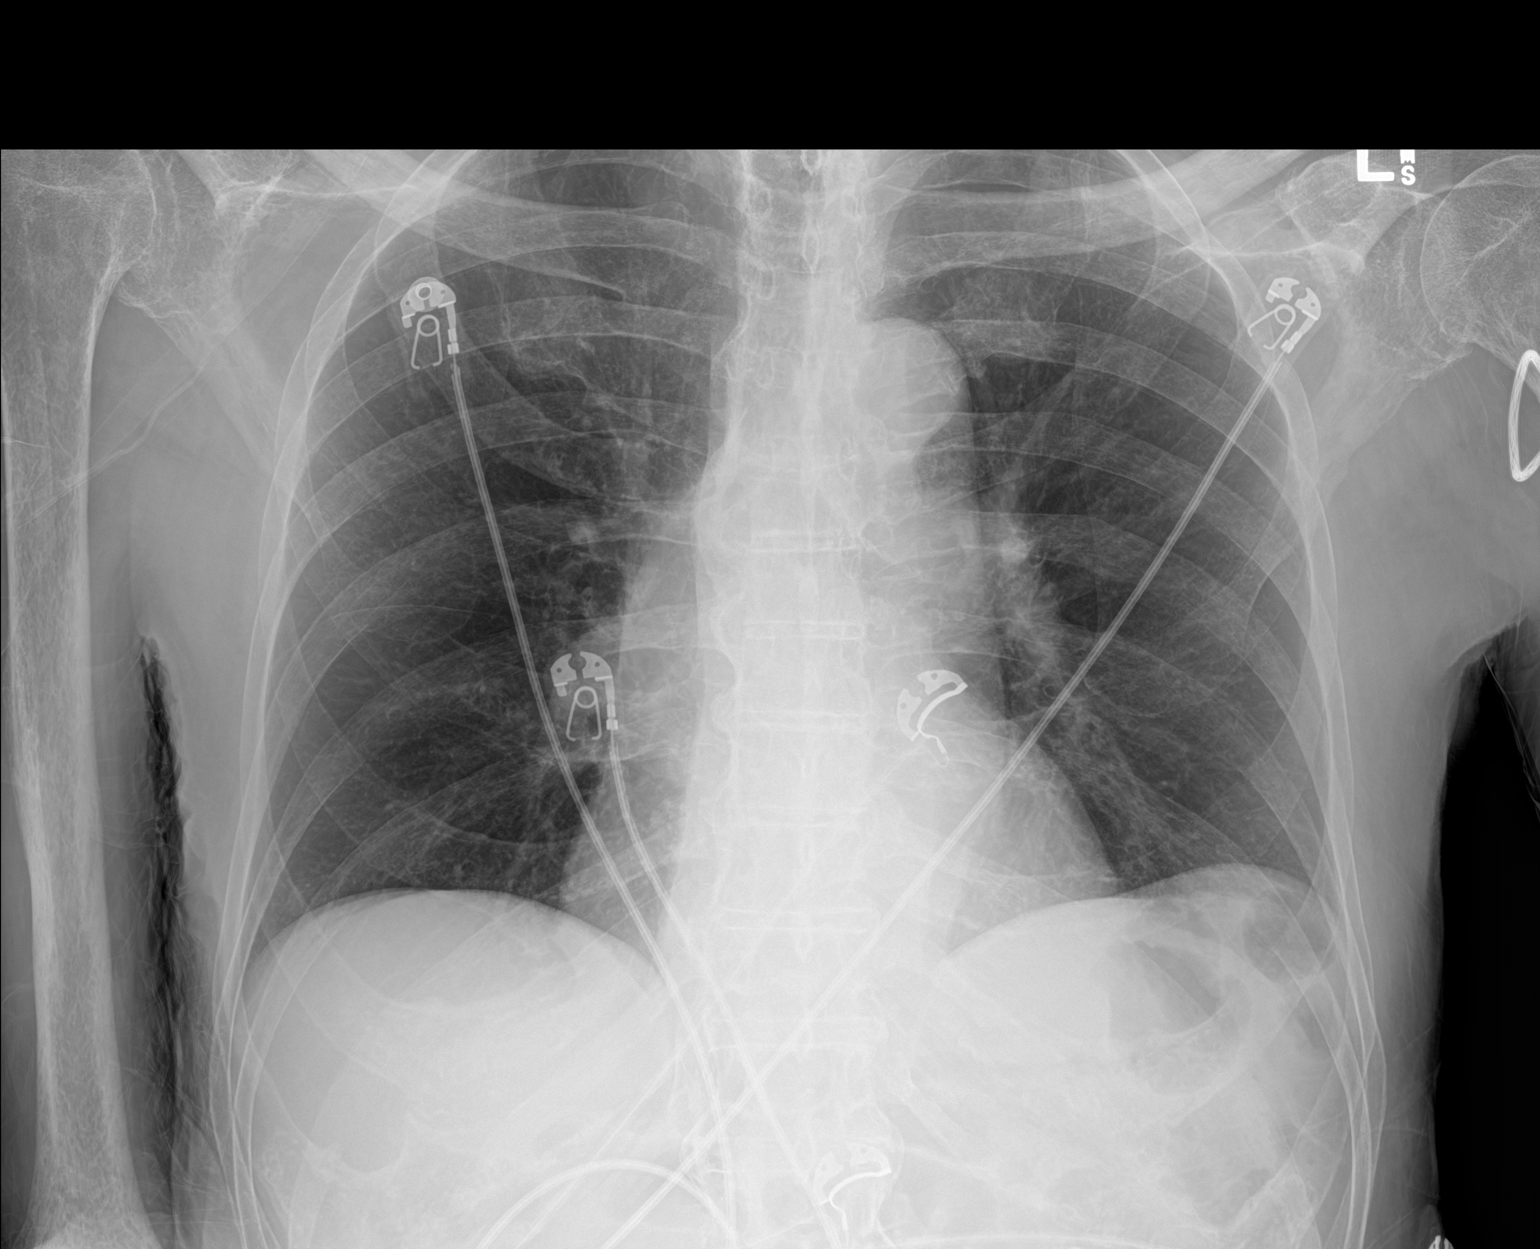

[1 of 1 positions shown; findings below may reference images not displayed]

FINDINGS: Lungs are adequately inflated without focal airspace consolidation
or effusion. Cardiomediastinal silhouette and remainder of the exam
is unchanged.
IMPRESSION: No active disease.

## 2019-07-19 IMAGING — CT CT ABDOMEN WITHOUT CONTRAST
2 of 4 series · 17 of 46 positions shown, 19 images · non-contrast
Comparison: None.

CLINICAL DATA: Preop for gastrostomy tube placement

EXAM:
CT ABDOMEN WITHOUT CONTRAST
TECHNIQUE: Multidetector CT imaging of the abdomen was performed following the
standard protocol without IV contrast.

[Series 3: a/p w/o 5mm · axial · non-contrast · 0.79mm/px · z∈[-214,+30]mm · 14 of 55 slices shown, 16 images]
[im 3/55  soft-tissue]
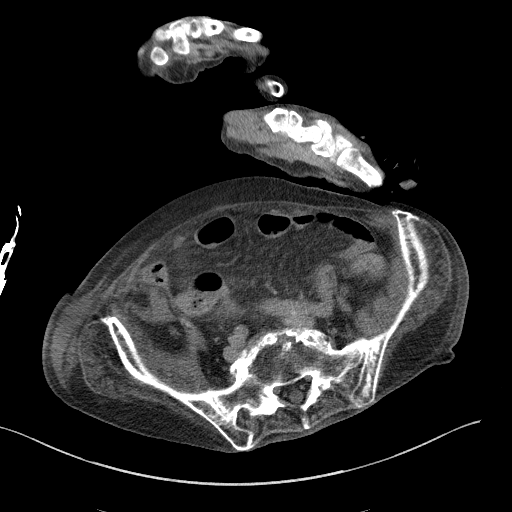
[im 3/55  bone]
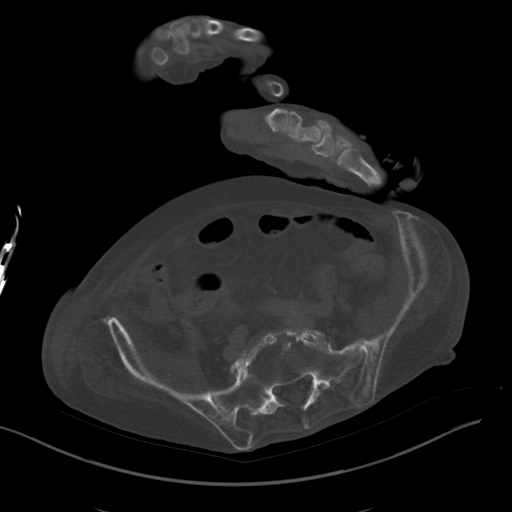
[im 8/55  soft-tissue]
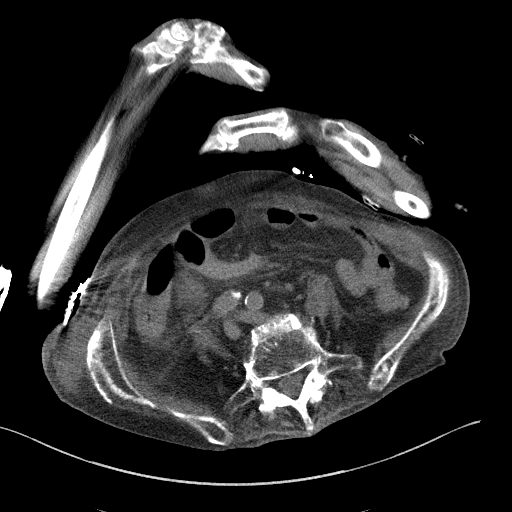
[im 11/55  soft-tissue]
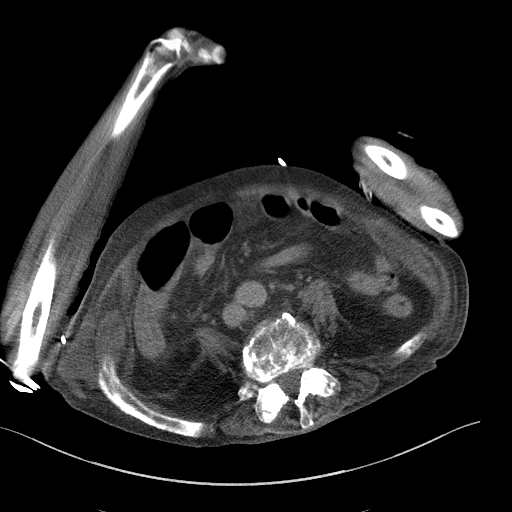
[im 16/55  soft-tissue]
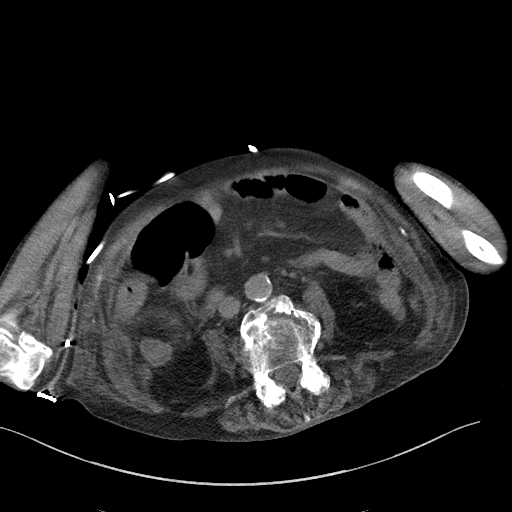
[im 19/55  soft-tissue]
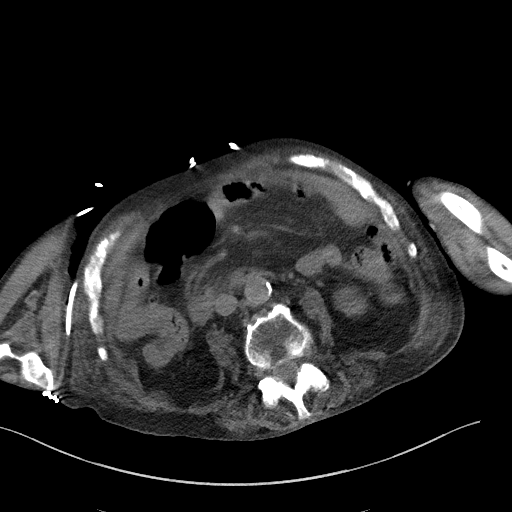
[im 21/55  soft-tissue]
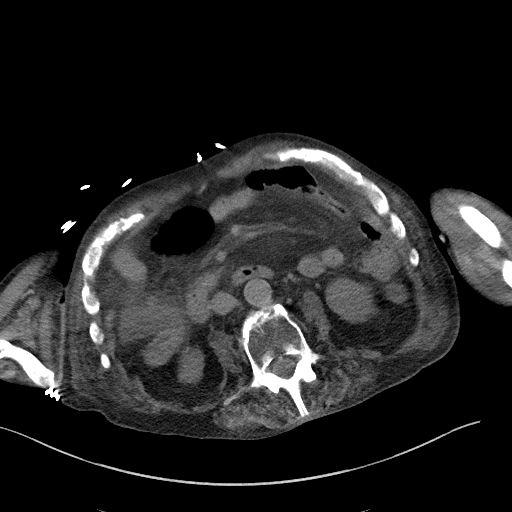
[im 26/55  soft-tissue]
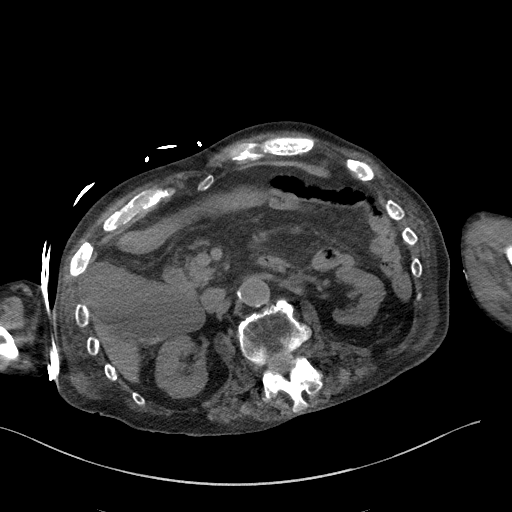
[im 29/55  soft-tissue]
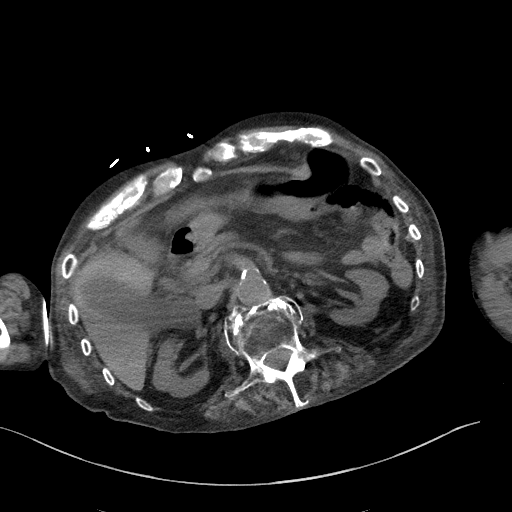
[im 34/55  soft-tissue]
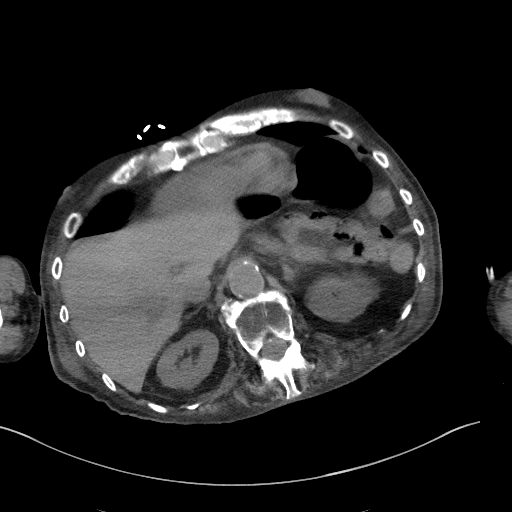
[im 34/55  bone]
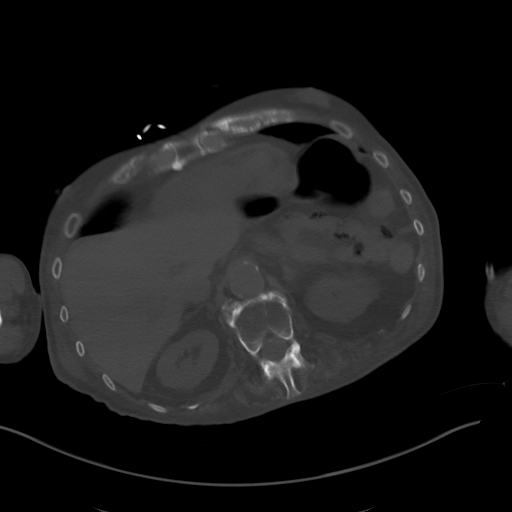
[im 37/55  soft-tissue]
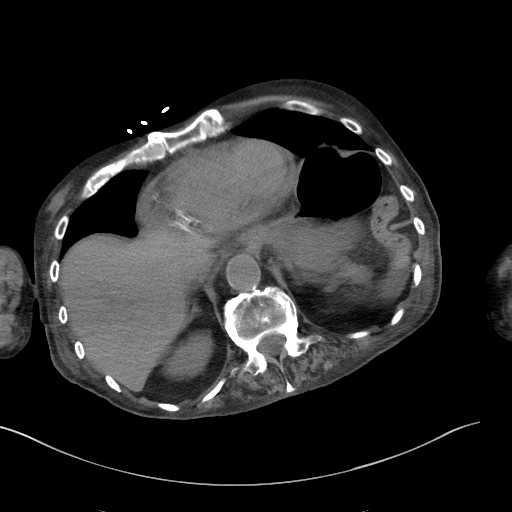
[im 42/55  soft-tissue]
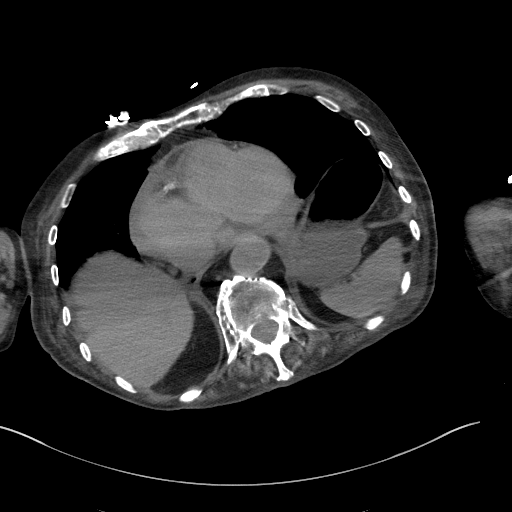
[im 44/55  soft-tissue]
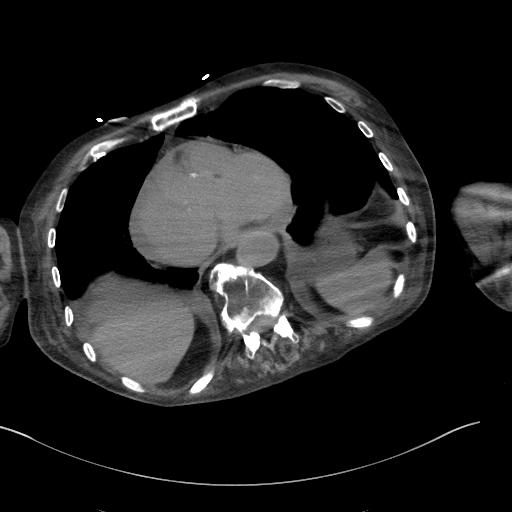
[im 47/55  soft-tissue]
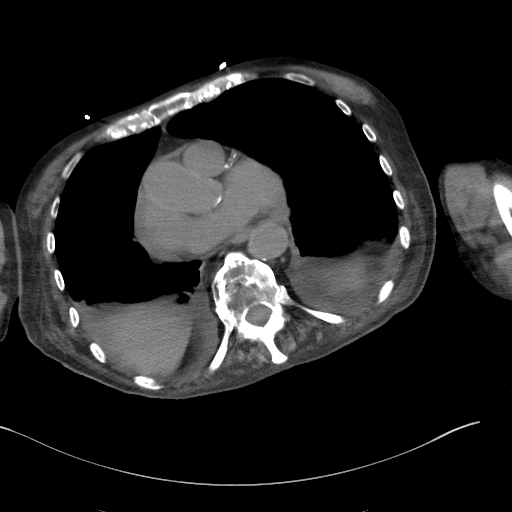
[im 52/55  soft-tissue]
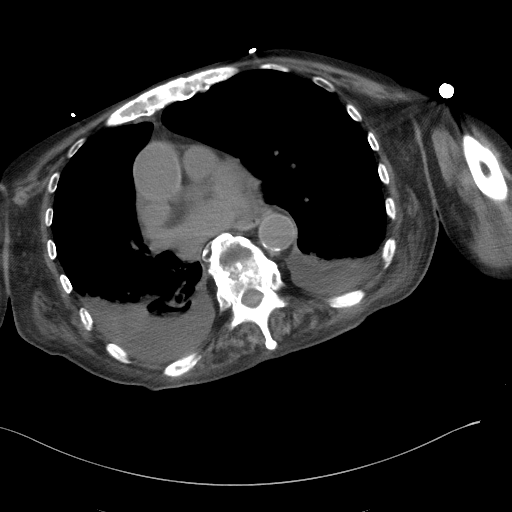

[Series 6: a/p w/o cor · coronal · non-contrast · 0.63mm/px · 3 of 151 slices shown]
[im 51/151  soft-tissue]
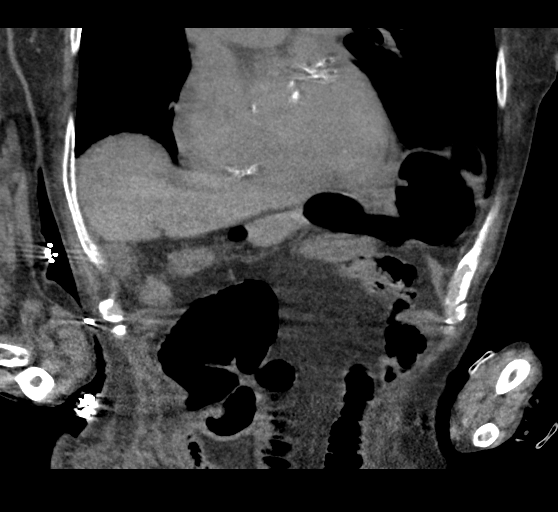
[im 67/151  soft-tissue]
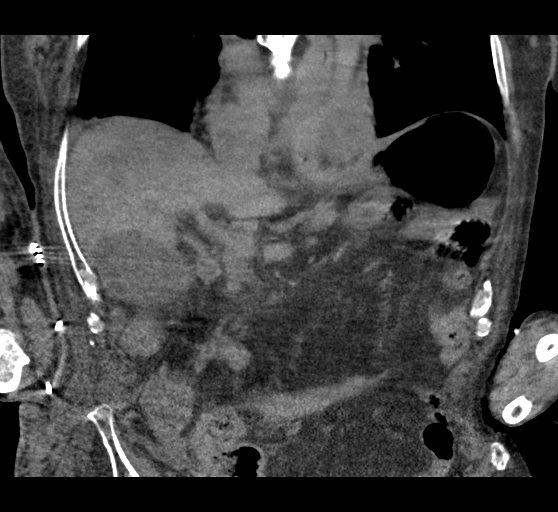
[im 84/151  soft-tissue]
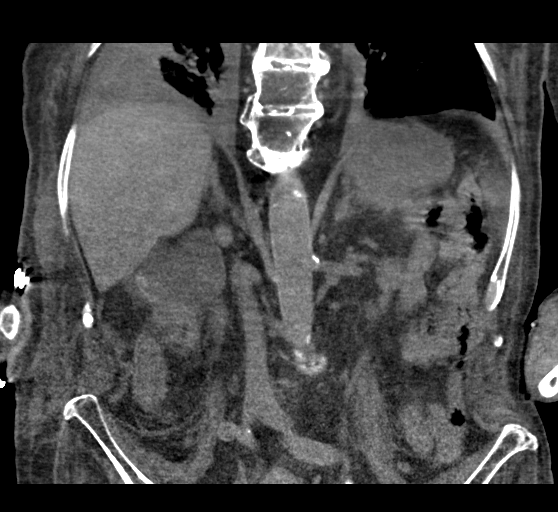

[17 of 46 positions shown; findings below may reference images not displayed]

FINDINGS: Lower chest: Moderate bilateral pleural effusions right greater than
left. There is associated dependent atelectasis.

Hepatobiliary: The left lobe of the liver is atrophic. Right lobe is
unremarkable. The gallbladder is distended and contains gallstones.
No obvious gallbladder wall thickening.

Pancreas: Atrophic.

Spleen: Unremarkable

Adrenals/Urinary Tract: Kidneys and adrenal glands are within normal
limits.

Stomach/Bowel: The body of the stomach is inferior to the left lobe
of the liver and adjacent to the transverse colon. It is
decompressed. No obvious mass in the colon. Small bowel is
decompressed.

Vascular/Lymphatic: Aortic atherosclerotic vascular calcifications.
No abnormal retroperitoneal adenopathy.

Other: No free fluid.

Musculoskeletal: No vertebral compression deformity.
IMPRESSION: Preop gastrostomy anatomy is acceptable for gastrostomy tube
attempt. The stomach is decompressed and adjacent to the transverse
colon. Hopefully, distention of the stomach will provide an adequate
target.

Bilateral pleural effusions.

Cholelithiasis.

## 2020-04-28 DEATH — deceased
